# Patient Record
Sex: Female | Born: 1935 | Race: White | Hispanic: No | Smoking: Never smoker
Health system: Southern US, Community
[De-identification: ages and names within clinical notes are randomized; demographics above are authoritative.]

## PROBLEM LIST (undated history)

## (undated) DIAGNOSIS — H3530 Unspecified macular degeneration: Secondary | ICD-10-CM

## (undated) DIAGNOSIS — E0500 Thyrotoxicosis with diffuse goiter without thyrotoxic crisis or storm: Secondary | ICD-10-CM

## (undated) DIAGNOSIS — F09 Unspecified mental disorder due to known physiological condition: Secondary | ICD-10-CM

## (undated) DIAGNOSIS — M1990 Unspecified osteoarthritis, unspecified site: Secondary | ICD-10-CM

## (undated) DIAGNOSIS — M40209 Unspecified kyphosis, site unspecified: Secondary | ICD-10-CM

## (undated) DIAGNOSIS — I4891 Unspecified atrial fibrillation: Secondary | ICD-10-CM

## (undated) DIAGNOSIS — I10 Essential (primary) hypertension: Secondary | ICD-10-CM

## (undated) DIAGNOSIS — K449 Diaphragmatic hernia without obstruction or gangrene: Secondary | ICD-10-CM

## (undated) DIAGNOSIS — Z950 Presence of cardiac pacemaker: Secondary | ICD-10-CM

## (undated) DIAGNOSIS — K529 Noninfective gastroenteritis and colitis, unspecified: Secondary | ICD-10-CM

## (undated) DIAGNOSIS — N390 Urinary tract infection, site not specified: Secondary | ICD-10-CM

## (undated) DIAGNOSIS — E039 Hypothyroidism, unspecified: Secondary | ICD-10-CM

## (undated) DIAGNOSIS — Z8619 Personal history of other infectious and parasitic diseases: Secondary | ICD-10-CM

## (undated) DIAGNOSIS — K5790 Diverticulosis of intestine, part unspecified, without perforation or abscess without bleeding: Secondary | ICD-10-CM

## (undated) DIAGNOSIS — A499 Bacterial infection, unspecified: Secondary | ICD-10-CM

## (undated) DIAGNOSIS — N261 Atrophy of kidney (terminal): Secondary | ICD-10-CM

## (undated) DIAGNOSIS — Z8719 Personal history of other diseases of the digestive system: Secondary | ICD-10-CM

## (undated) HISTORY — DX: Unspecified atrial fibrillation: I48.91

## (undated) HISTORY — DX: Unspecified kyphosis, site unspecified: M40.209

## (undated) HISTORY — DX: Unspecified macular degeneration: H35.30

## (undated) HISTORY — DX: Urinary tract infection, site not specified: A49.9

## (undated) HISTORY — DX: Unspecified mental disorder due to known physiological condition: F09

## (undated) HISTORY — DX: Diverticulosis of intestine, part unspecified, without perforation or abscess without bleeding: K57.90

## (undated) HISTORY — DX: Bacterial infection, unspecified: N39.0

## (undated) HISTORY — DX: Personal history of other diseases of the digestive system: Z87.19

## (undated) HISTORY — DX: Essential (primary) hypertension: I10

## (undated) HISTORY — DX: Diaphragmatic hernia without obstruction or gangrene: K44.9

## (undated) HISTORY — DX: Hypothyroidism, unspecified: E03.9

## (undated) HISTORY — DX: Thyrotoxicosis with diffuse goiter without thyrotoxic crisis or storm: E05.00

## (undated) HISTORY — DX: Unspecified osteoarthritis, unspecified site: M19.90

## (undated) HISTORY — DX: Atrophy of kidney (terminal): N26.1

## (undated) HISTORY — DX: Noninfective gastroenteritis and colitis, unspecified: K52.9

---

## 1997-04-03 ENCOUNTER — Emergency Department (HOSPITAL_COMMUNITY): Admission: AD | Admit: 1997-04-03 | Discharge: 1997-04-03 | Payer: Self-pay | Admitting: Emergency Medicine

## 1997-11-10 ENCOUNTER — Other Ambulatory Visit: Admission: RE | Admit: 1997-11-10 | Discharge: 1997-11-10 | Payer: Self-pay | Admitting: Family Medicine

## 1998-11-24 ENCOUNTER — Other Ambulatory Visit: Admission: RE | Admit: 1998-11-24 | Discharge: 1998-11-24 | Payer: Self-pay | Admitting: Family Medicine

## 1999-05-24 ENCOUNTER — Encounter: Payer: Self-pay | Admitting: Family Medicine

## 1999-05-24 ENCOUNTER — Encounter: Admission: RE | Admit: 1999-05-24 | Discharge: 1999-05-24 | Payer: Self-pay | Admitting: Family Medicine

## 1999-05-31 ENCOUNTER — Encounter: Payer: Self-pay | Admitting: Family Medicine

## 1999-05-31 ENCOUNTER — Encounter: Admission: RE | Admit: 1999-05-31 | Discharge: 1999-05-31 | Payer: Self-pay | Admitting: Family Medicine

## 1999-07-31 ENCOUNTER — Inpatient Hospital Stay (HOSPITAL_COMMUNITY): Admission: EM | Admit: 1999-07-31 | Discharge: 1999-08-14 | Payer: Self-pay | Admitting: Emergency Medicine

## 1999-08-02 ENCOUNTER — Encounter: Payer: Self-pay | Admitting: Gastroenterology

## 1999-08-03 ENCOUNTER — Encounter: Payer: Self-pay | Admitting: Gastroenterology

## 1999-08-06 HISTORY — PX: OTHER SURGICAL HISTORY: SHX169

## 1999-08-21 ENCOUNTER — Emergency Department (HOSPITAL_COMMUNITY): Admission: EM | Admit: 1999-08-21 | Discharge: 1999-08-21 | Payer: Self-pay | Admitting: *Deleted

## 1999-08-21 ENCOUNTER — Encounter: Payer: Self-pay | Admitting: *Deleted

## 2000-04-21 ENCOUNTER — Inpatient Hospital Stay (HOSPITAL_COMMUNITY): Admission: RE | Admit: 2000-04-21 | Discharge: 2000-04-24 | Payer: Self-pay | Admitting: General Surgery

## 2000-04-21 HISTORY — PX: OTHER SURGICAL HISTORY: SHX169

## 2001-09-17 ENCOUNTER — Encounter: Payer: Self-pay | Admitting: Emergency Medicine

## 2001-09-17 ENCOUNTER — Emergency Department (HOSPITAL_COMMUNITY): Admission: EM | Admit: 2001-09-17 | Discharge: 2001-09-17 | Payer: Self-pay | Admitting: Emergency Medicine

## 2001-09-17 ENCOUNTER — Encounter: Payer: Self-pay | Admitting: Endocrinology

## 2001-09-17 ENCOUNTER — Ambulatory Visit (HOSPITAL_COMMUNITY): Admission: RE | Admit: 2001-09-17 | Discharge: 2001-09-17 | Payer: Self-pay | Admitting: Endocrinology

## 2001-09-29 ENCOUNTER — Inpatient Hospital Stay (HOSPITAL_COMMUNITY): Admission: EM | Admit: 2001-09-29 | Discharge: 2001-10-02 | Payer: Self-pay | Admitting: Emergency Medicine

## 2001-10-02 ENCOUNTER — Encounter: Payer: Self-pay | Admitting: Endocrinology

## 2001-10-02 ENCOUNTER — Ambulatory Visit (HOSPITAL_COMMUNITY): Admission: RE | Admit: 2001-10-02 | Discharge: 2001-10-02 | Payer: Self-pay | Admitting: Endocrinology

## 2001-10-03 DIAGNOSIS — Z8719 Personal history of other diseases of the digestive system: Secondary | ICD-10-CM

## 2001-10-03 HISTORY — DX: Personal history of other diseases of the digestive system: Z87.19

## 2002-01-25 ENCOUNTER — Inpatient Hospital Stay (HOSPITAL_COMMUNITY): Admission: EM | Admit: 2002-01-25 | Discharge: 2002-01-26 | Payer: Self-pay | Admitting: Emergency Medicine

## 2002-02-08 ENCOUNTER — Other Ambulatory Visit: Admission: RE | Admit: 2002-02-08 | Discharge: 2002-02-08 | Payer: Self-pay | Admitting: Endocrinology

## 2003-02-01 DIAGNOSIS — N261 Atrophy of kidney (terminal): Secondary | ICD-10-CM

## 2003-02-01 HISTORY — DX: Atrophy of kidney (terminal): N26.1

## 2003-10-13 ENCOUNTER — Inpatient Hospital Stay (HOSPITAL_COMMUNITY): Admission: RE | Admit: 2003-10-13 | Discharge: 2003-10-21 | Payer: Self-pay | Admitting: Orthopedic Surgery

## 2003-10-13 DIAGNOSIS — I4891 Unspecified atrial fibrillation: Secondary | ICD-10-CM

## 2003-10-13 HISTORY — PX: TOTAL KNEE ARTHROPLASTY: SHX125

## 2003-10-13 HISTORY — DX: Unspecified atrial fibrillation: I48.91

## 2003-10-16 ENCOUNTER — Encounter (INDEPENDENT_AMBULATORY_CARE_PROVIDER_SITE_OTHER): Payer: Self-pay | Admitting: *Deleted

## 2003-11-09 ENCOUNTER — Inpatient Hospital Stay (HOSPITAL_COMMUNITY): Admission: EM | Admit: 2003-11-09 | Discharge: 2003-12-05 | Payer: Self-pay | Admitting: Emergency Medicine

## 2003-12-17 ENCOUNTER — Ambulatory Visit: Payer: Self-pay | Admitting: Gastroenterology

## 2003-12-18 ENCOUNTER — Ambulatory Visit (HOSPITAL_COMMUNITY): Admission: RE | Admit: 2003-12-18 | Discharge: 2003-12-18 | Payer: Self-pay | Admitting: Internal Medicine

## 2003-12-18 ENCOUNTER — Ambulatory Visit: Payer: Self-pay | Admitting: Internal Medicine

## 2004-02-03 ENCOUNTER — Ambulatory Visit: Payer: Self-pay | Admitting: Internal Medicine

## 2004-11-17 IMAGING — CR DG CHEST 1V PORT
1 series · 1 of 1 positions shown · non-contrast
Comparison: none

CLINICAL DATA: Atrial fibrillation. 
 PORTABLE CHEST, 11/24/03, [DATE] HOURS
 Right upper extremity PICC line remains.  Heart is mildly enlarged and stable.  Interstitial markings remain moderately prominent bilaterally, suggesting pulmonary edema.  There may be a small left pleural effusion not clearly present on yesterday's study.

[view not recorded]
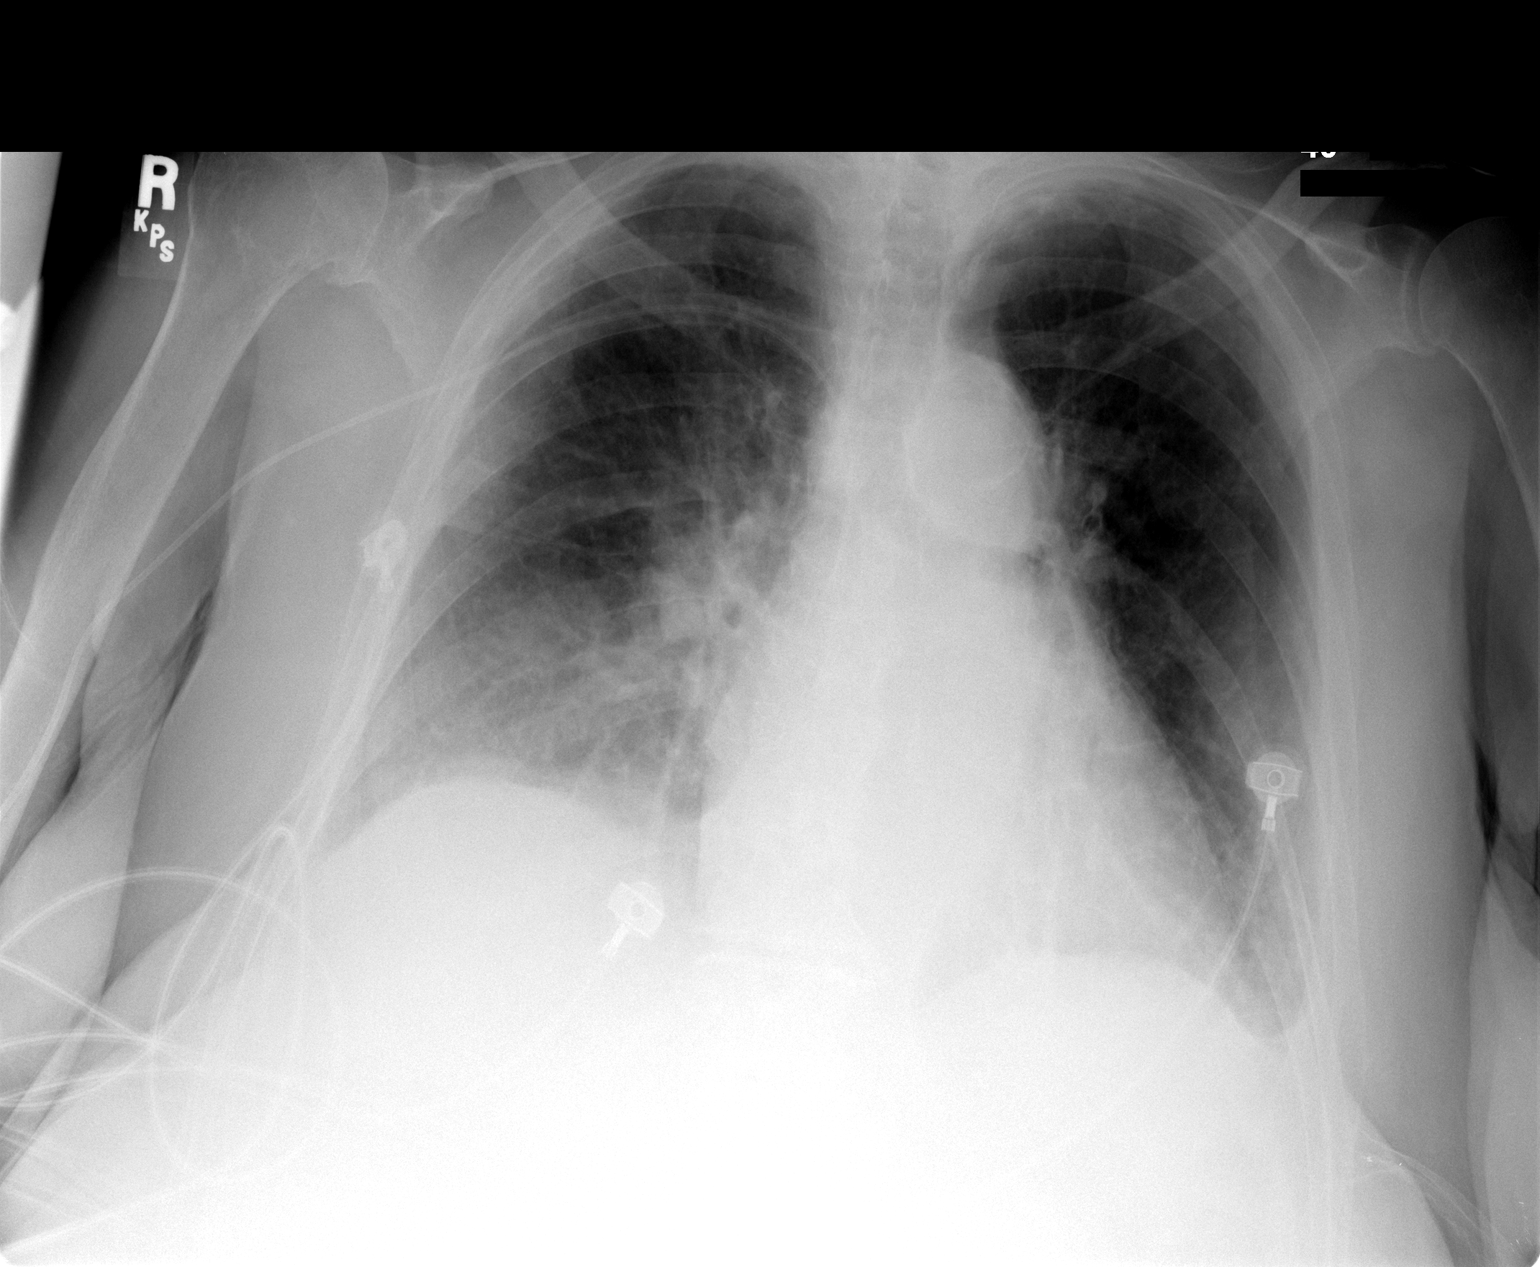

[1 of 1 positions shown; findings below may reference images not displayed]

IMPRESSION: No significant change in probable old congestive heart failure.  Query small left pleural effusion.

## 2004-11-19 IMAGING — CT CT ABDOMEN W/ CM
1 of 3 series · 14 of 32 positions shown, 19 images · IV contrast (100 ML OMNI 300)
Comparison: none

CLINICAL DATA: Generalized abdominal pain and distention. Elevated white count. Atrial
fibrillation.   Pseudomembranous colitis.
TECHNIQUE: Multidetector CT imaging of the abdomen and pelvis was performed during administration
of 100 cc Omnipaque 300 intravenous contrast. Oral contrast was also administered.

[Series 2: routine abdomen · axial · 0.74mm/px · z∈[-441,-31]mm · 14 of 92 slices shown, 19 images]
[im 5/92  soft-tissue]
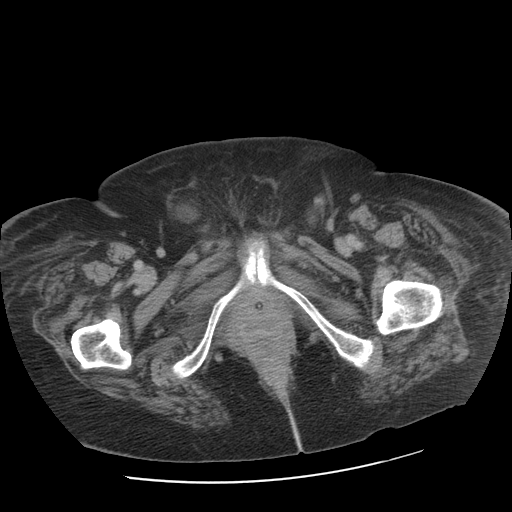
[im 5/92  bone]
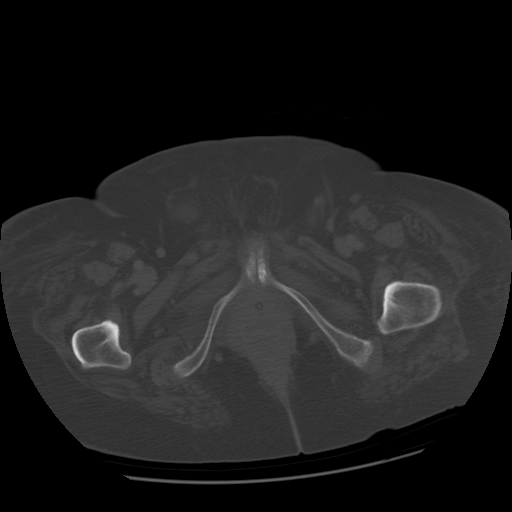
[im 14/92  soft-tissue]
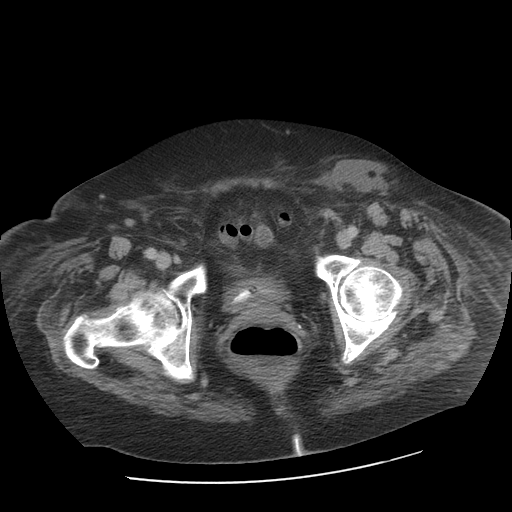
[im 19/92  soft-tissue]
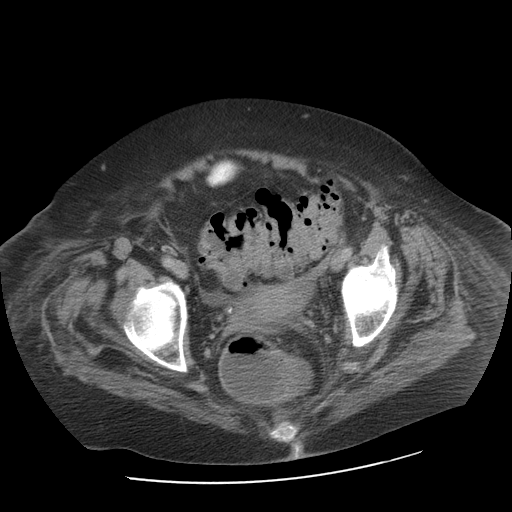
[im 28/92  soft-tissue]
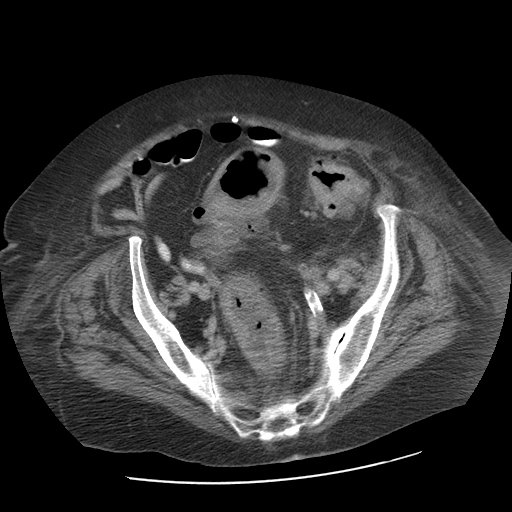
[im 32/92  soft-tissue]
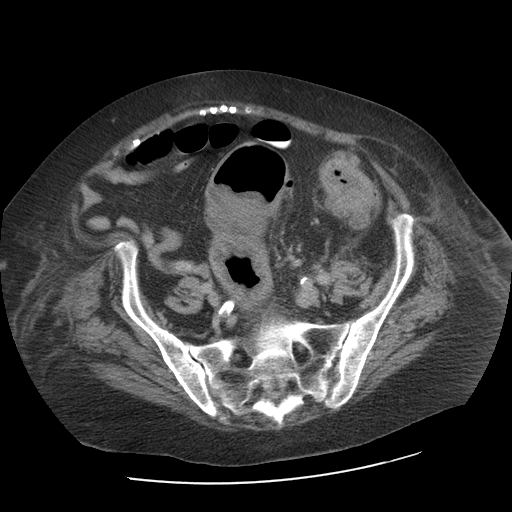
[im 41/92  soft-tissue]
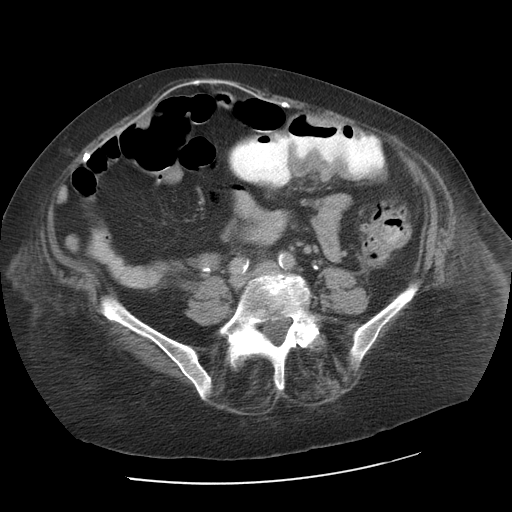
[im 46/92  soft-tissue]
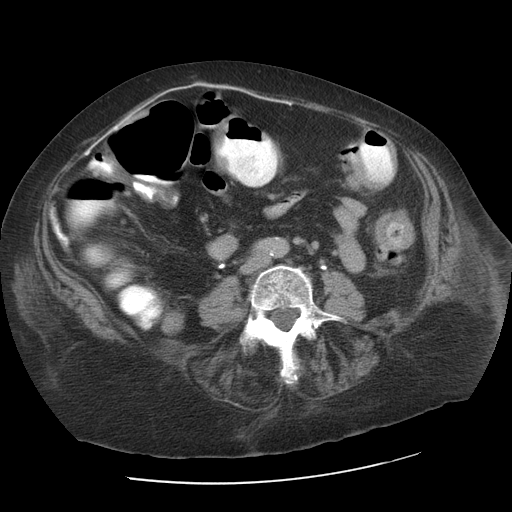
[im 51/92  soft-tissue]
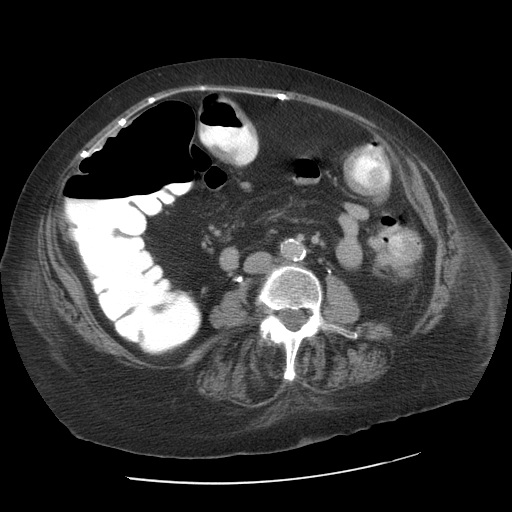
[im 60/92  soft-tissue]
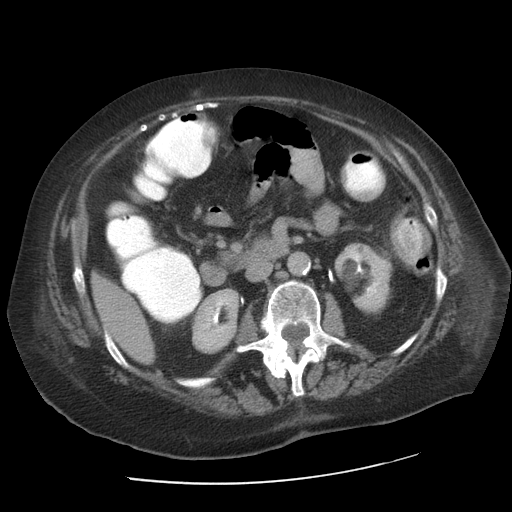
[im 60/92  bone]
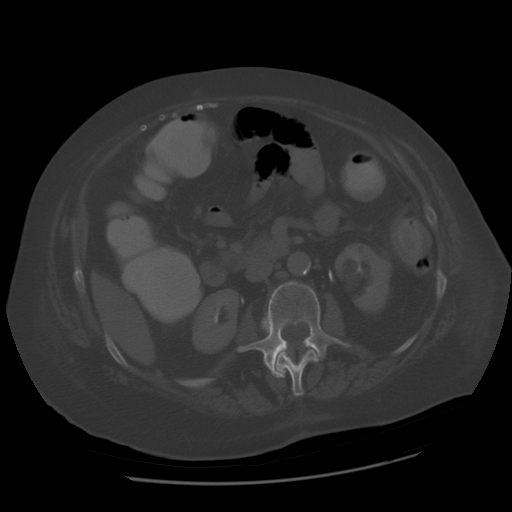
[im 64/92  soft-tissue]
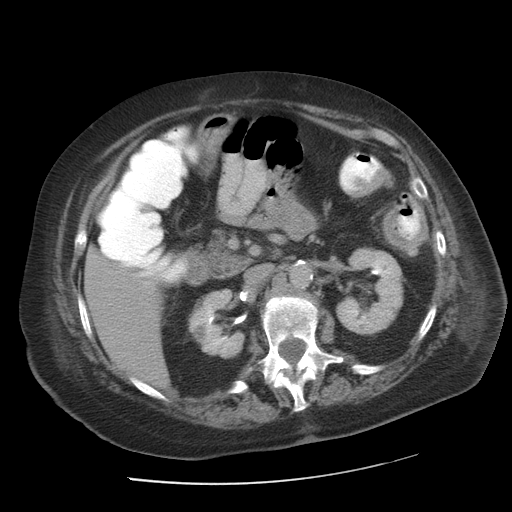
[im 73/92  soft-tissue]
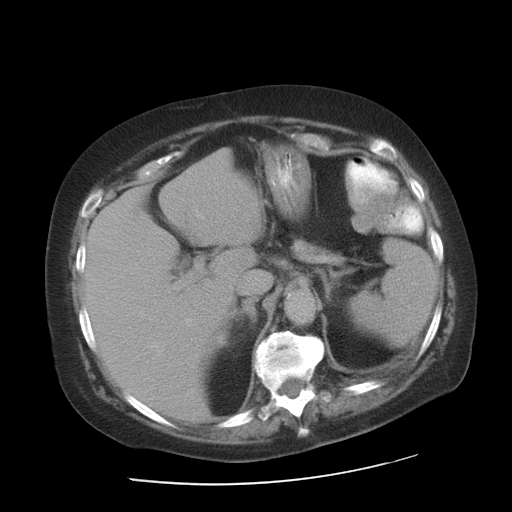
[im 73/92  lung]
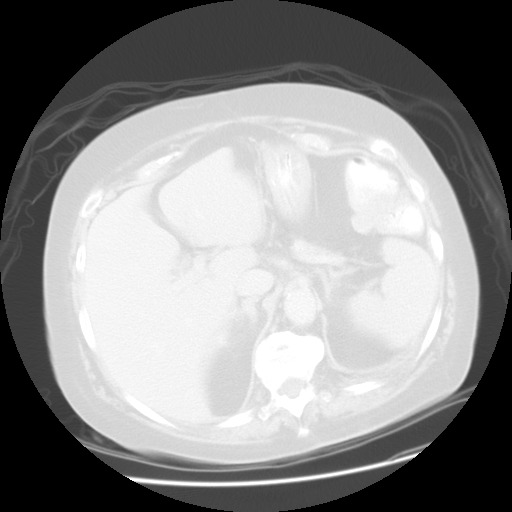
[im 78/92  soft-tissue]
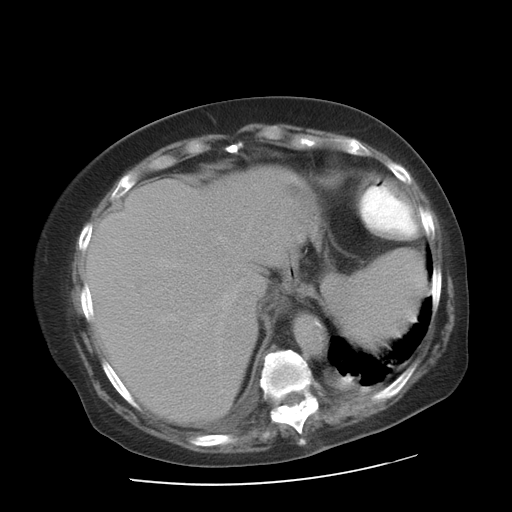
[im 78/92  lung]
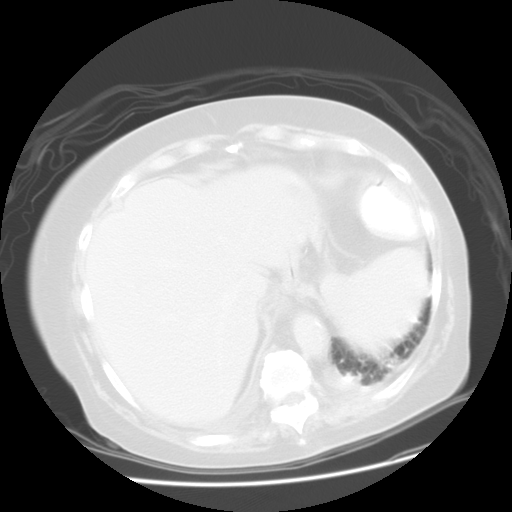
[im 82/92  lung]
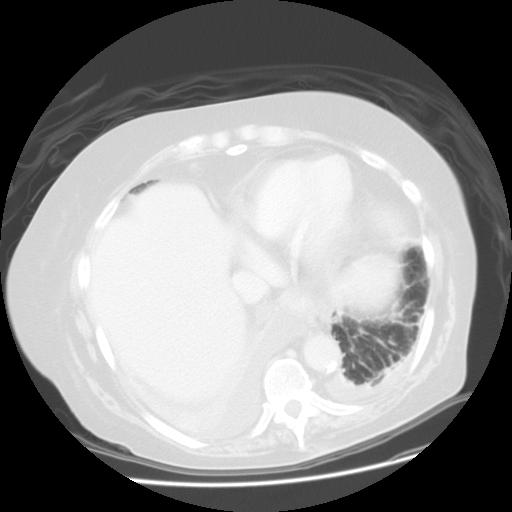
[im 87/92  soft-tissue]
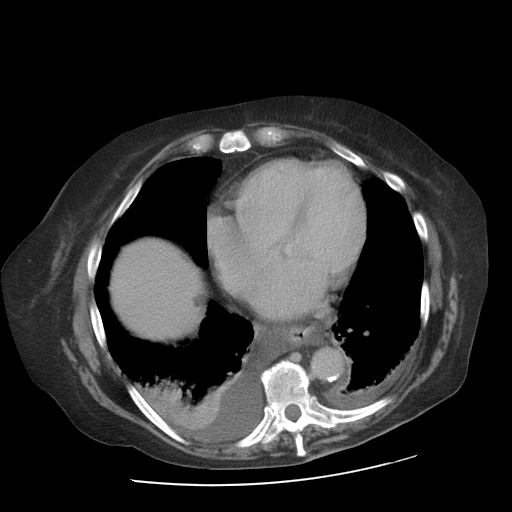
[im 87/92  lung]
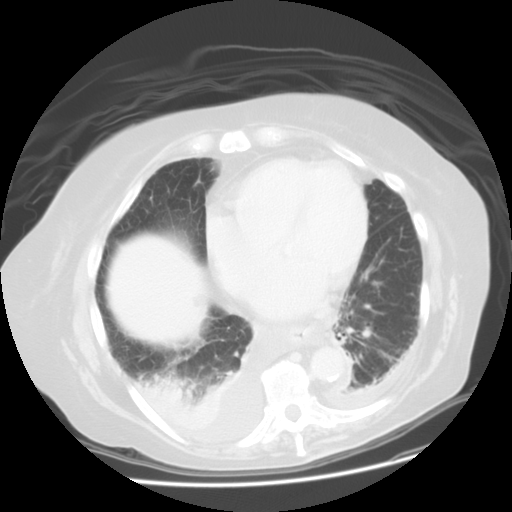

[14 of 32 positions shown; findings below may reference images not displayed]

Comparison was made with the prior study of 11/13/03.

ABDOMEN CT WITH CONTRAST:

Small right pleural effusion is mildly increased in size, while tiny left pleural effusion is
stable. Small cysts in the dome of the liver are unchanged. Small bilateral renal cysts are also
stable. 

Significant decrease in colonic wall thickening is seen involving the ascending and transverse
colon. There is persistent wall thickening involving the splenic flexure and descending colon which
has not significantly changed.

There is no evidence of abscess, ascites, or free air. There is no evidence of adenopathy.  Tiny
density is seen in the gallbladder neck suspicious for a tiny gallstone. There is no evidence of
acute cholecystitis.
IMPRESSION: 1.  Improving colitis involving the ascending and transverse colon. Colitis in the areas of the
splenic flexure and descending colon have not significantly changed.

2.  Increased small right pleural effusion. Tiny left pleural effusion is stable.

3.  Question tiny gallstones. 

PELVIS CT WITH CONTRAST:

Moderate to severe colonic wall thickening is again seen involving the rectosigmoid colon and
descending colon and is not significantly changed. Diverticulosis is also noted. There is no
evidence of abscess or free fluid. No masses are identified. Bilateral inguinal hernias containing
fat are again seen, left side larger than right. A Foley catheter is seen within the bladder.
IMPRESSION: 1.   No significant change in severe colitis involving the descending and rectosigmoid colon.

2.  No evidence of abscess or free fluid. 

3.  Stable bilateral inguinal hernias containing only fat. No evidence of herniated bowel loops.

## 2005-09-20 ENCOUNTER — Ambulatory Visit: Payer: Self-pay

## 2005-09-21 ENCOUNTER — Ambulatory Visit: Payer: Self-pay | Admitting: Cardiology

## 2005-10-04 ENCOUNTER — Encounter: Payer: Self-pay | Admitting: Cardiovascular Disease

## 2005-10-04 ENCOUNTER — Ambulatory Visit: Payer: Self-pay

## 2005-10-20 ENCOUNTER — Ambulatory Visit: Payer: Self-pay | Admitting: Cardiology

## 2005-12-15 ENCOUNTER — Ambulatory Visit: Payer: Self-pay

## 2005-12-19 ENCOUNTER — Ambulatory Visit: Payer: Self-pay | Admitting: Cardiology

## 2006-05-29 ENCOUNTER — Ambulatory Visit: Payer: Self-pay | Admitting: Cardiology

## 2006-10-29 ENCOUNTER — Emergency Department (HOSPITAL_COMMUNITY): Admission: EM | Admit: 2006-10-29 | Discharge: 2006-10-29 | Payer: Self-pay | Admitting: Emergency Medicine

## 2007-06-27 ENCOUNTER — Ambulatory Visit: Payer: Self-pay | Admitting: Cardiology

## 2008-07-08 ENCOUNTER — Encounter: Admission: RE | Admit: 2008-07-08 | Discharge: 2008-07-08 | Payer: Self-pay | Admitting: Internal Medicine

## 2008-10-30 ENCOUNTER — Encounter: Payer: Self-pay | Admitting: Internal Medicine

## 2009-06-11 ENCOUNTER — Ambulatory Visit: Payer: Self-pay

## 2009-06-11 ENCOUNTER — Encounter: Payer: Self-pay | Admitting: Internal Medicine

## 2009-06-12 ENCOUNTER — Telehealth: Payer: Self-pay | Admitting: Cardiology

## 2009-06-18 ENCOUNTER — Telehealth (INDEPENDENT_AMBULATORY_CARE_PROVIDER_SITE_OTHER): Payer: Self-pay | Admitting: *Deleted

## 2009-06-23 ENCOUNTER — Encounter: Payer: Self-pay | Admitting: Internal Medicine

## 2009-06-30 ENCOUNTER — Ambulatory Visit: Payer: Self-pay | Admitting: Internal Medicine

## 2009-06-30 DIAGNOSIS — I10 Essential (primary) hypertension: Secondary | ICD-10-CM

## 2009-06-30 DIAGNOSIS — I4891 Unspecified atrial fibrillation: Secondary | ICD-10-CM

## 2009-06-30 DIAGNOSIS — I498 Other specified cardiac arrhythmias: Secondary | ICD-10-CM | POA: Insufficient documentation

## 2009-06-30 DIAGNOSIS — R55 Syncope and collapse: Secondary | ICD-10-CM | POA: Insufficient documentation

## 2009-07-07 ENCOUNTER — Telehealth: Payer: Self-pay | Admitting: Internal Medicine

## 2009-07-08 ENCOUNTER — Encounter: Payer: Self-pay | Admitting: Internal Medicine

## 2009-07-13 ENCOUNTER — Encounter: Payer: Self-pay | Admitting: Internal Medicine

## 2009-07-16 ENCOUNTER — Encounter: Payer: Self-pay | Admitting: Internal Medicine

## 2009-07-17 ENCOUNTER — Ambulatory Visit: Payer: Self-pay | Admitting: Internal Medicine

## 2009-07-17 ENCOUNTER — Ambulatory Visit (HOSPITAL_COMMUNITY): Admission: RE | Admit: 2009-07-17 | Discharge: 2009-07-18 | Payer: Self-pay | Admitting: Internal Medicine

## 2009-07-18 ENCOUNTER — Encounter: Payer: Self-pay | Admitting: Internal Medicine

## 2009-07-20 ENCOUNTER — Ambulatory Visit: Payer: Self-pay | Admitting: Internal Medicine

## 2009-07-20 ENCOUNTER — Telehealth: Payer: Self-pay | Admitting: Internal Medicine

## 2009-07-27 ENCOUNTER — Ambulatory Visit: Payer: Self-pay | Admitting: Internal Medicine

## 2009-07-27 DIAGNOSIS — R609 Edema, unspecified: Secondary | ICD-10-CM | POA: Insufficient documentation

## 2009-11-02 ENCOUNTER — Ambulatory Visit: Payer: Self-pay | Admitting: Internal Medicine

## 2010-02-04 ENCOUNTER — Encounter: Payer: Self-pay | Admitting: Internal Medicine

## 2010-02-04 ENCOUNTER — Ambulatory Visit
Admission: RE | Admit: 2010-02-04 | Discharge: 2010-02-04 | Payer: Self-pay | Source: Home / Self Care | Attending: Internal Medicine | Admitting: Internal Medicine

## 2010-02-25 ENCOUNTER — Encounter: Payer: Self-pay | Admitting: Internal Medicine

## 2010-03-04 NOTE — Progress Notes (Signed)
  Faxed Holter over to Anita/ [facility] [phone] [facility]  Jun 18, 2009 11:54 AM

## 2010-03-04 NOTE — Assessment & Plan Note (Signed)
Summary: 10-14 day s/p pacer/mt   CC:  10-14 day s/p placer.  Pt having device checked today.  Swelling on ankle of left leg..  Current Medications (verified): 1)  Levoxyl 150 Mcg Tabs (Levothyroxine Sodium) .... Take One Tablet Once Daily 2)  Digoxin 0.125 Mg Tabs (Digoxin) .... Take One Tablet Once Daily 3)  Metoprolol Tartrate 50 Mg Tabs (Metoprolol Tartrate) .... One Tablet Two Times A Day 4)  Preservision/lutein  Caps (Multiple Vitamins-Minerals) .... Once Daily 5)  Centrum Silver  Tabs (Multiple Vitamins-Minerals) .... Once Daily 6)  Warfarin Sodium 2 Mg Tabs (Warfarin Sodium) .... Uad 7)  Enablex 7.5 Mg Xr24h-Tab (Darifenacin Hydrobromide) .... Take One Tablet Once Daily 8)  Aricept 10 Mg Tabs (Donepezil Hcl) .... Take One Tablet Once Daily 9)  Imodium A-D 2 Mg Tabs (Loperamide Hcl) .... As Needed  Allergies (verified): 1)  ! Jonne Ply  Past History:  Past Medical History: Last updated: 06/24/2009 Hypertension Hypothyroidism History of toxic goiter with subsequent ablation and hypothyroid state Hyperlipidemia Osteoarthritis Osteoporosis Kyphosis of thoracic spine Macular degeneration Chronic diarrhea Diverticulosis Diverticular bleed October 03, 2001 Asthma Mild cognitive deficit Bronchiectasis left lower lobe noted on x-ray 2/005. History of hiatal hernia with repair. Urinary incontinence Recurrent urinary tract infections October 13, 2003, new onset atrial fibrillation 2005, right renal atrophy  Vital Signs:  Patient profile:   77 year old female Height:      65 inches Weight:      147 pounds BMI:     24.55 Pulse rhythm:   regular BP sitting:   180 / 92  (left arm) Cuff size:   regular  Vitals Entered By: Judithe Modest CMA (July 27, 2009 2:22 PM)  Physical Exam  General:  The patient was alert and oriented in no acute distress. Lungs were clear. Heart sounds were irregular without murmurs or gallops. pacemaker pocket was well-healedAbdomen was soft  with active bowel sounds. There is no clubbing cyanosis ; healing wound on the left shin with bilateral edema left greater than right    PPM Specifications Following MD:  Sherryl Manges, MD     PPM Vendor:  St Jude     PPM Model Number:  [id number]     PPM Serial Number:  [id number] PPM DOI:  07/17/2009     PPM Implanting MD:  Sherryl Manges, MD  Lead 1    Location: RV     DOI: 07/17/2009     Model #: 1948     Serial #: [id number]      Magnet Response Rate:  BOL 100 ERI  85  Indications:  A-fib   PPM Follow Up Remote Check?  No Battery Voltage:  3.05 V     Battery Est. Longevity:  10 YEARS     Pacer Dependent:  No     Right Ventricle  Amplitude: 9.6 mV, Impedance: 560 ohms, Threshold: 0.75 V at 0.4 msec  Episodes Ventricular High Rate:  0     Ventricular Pacing:  39%  Parameters Mode:  VVI     Lower Rate Limit:  60     Next Cardiology Appt Due:  10/01/2009 Tech Comments:  Wound check appt.  Steri-strips removed.  Wound without redness or edema.  Normal device function.  No changes made today.  ROV 3 months SK. Gypsy Balsam RN BSN  July 27, 2009 2:42 PM   Impression & Recommendations:  Problem # 1:  EDEMA (ICD-782.3)  I will begin her a low dose (Lasix 20) diuretic  once or twice a week to help mobilize some of the fluid.  last creatinine and potassium early June were 1.3/4.0  Problem # 2:  BRADYCARDIA (ICD-427.89) She is pacing 40% of the time; we'll leave things as they are Her updated medication list for this problem includes:    Metoprolol Tartrate 50 Mg Tabs (Metoprolol tartrate) ..... One tablet two times a day    Warfarin Sodium 2 Mg Tabs (Warfarin sodium) ..... Uad  Problem # 3:  HYPERTENSION, BENIGN (ICD-401.1) repeat blood pressure was 156/72; diuretics may help this problem. Her updated medication list for this problem includes:    Metoprolol Tartrate 50 Mg Tabs (Metoprolol tartrate) ..... One tablet two times a day    Furosemide 20 Mg Tabs (Furosemide) .Marland Kitchen... Take one tablet by  mouth once or twice weekly  Patient Instructions: 1)  Your physician recommends that you schedule a follow-up appointment in: 3 months with Dr Graciela Husbands. 2)  Your physician has recommended you make the following change in your medication: Take Furosemide 20mg  1 tablet once or twice weekly. Prescriptions: FUROSEMIDE 20 MG TABS (FUROSEMIDE) Take one tablet by mouth once or twice weekly  #8 x 3   Entered by:   Optometrist BSN   Authorized by:   Nathen May, MD, [facility]   Signed by:   Gypsy Balsam RN BSN on 07/27/2009   Method used:   Electronically to        [facility]* (retail)       [address]       [place], [place]  [id number]       Ph: [phone]       Fax: [phone]   RxID:   [id number]

## 2010-03-04 NOTE — Miscellaneous (Signed)
Summary: Orders Update  Clinical Lists Changes  Orders: Added new Test order of TLB-BMP (Basic Metabolic Panel-BMET) (80048-METABOL) - Signed Added new Test order of TLB-CBC Platelet - w/Differential (85025-CBCD) - Signed Added new Test order of TLB-PT (Protime) (85610-PTP) - Signed Added new Test order of TLB-PTT (85730-PTTL) - Signed Observations: Added new observation of PI CARDIO: Labs to be drawn at [facility] on 07-16-09.  Order faxed to Sue Lush, RN. (07/13/2009 12:07)      Patient Instructions: 1)  Labs to be drawn at [facility] on 07-16-09.  Order faxed to Sue Lush, RN.

## 2010-03-04 NOTE — Progress Notes (Signed)
Summary: next plan of care while pt wear holter  Phone Note From Other Clinic   Caller: Melvenia Beam [phone]- [phone].  Request: Talk with Nurse Details for Reason:  has question regarding holter, next step of plan of care.  Initial call taken by: Lorne Skeens,  Jun 12, 2009 10:23 AM  Follow-up for Phone Call        pt asked Sue Lush to remove the monitor that she was wearing--I do not have any information about the monitor and have transferred Sue Lush to Marcos Eke for further instrucitons about the monitor Katina Dung, RN, BSN  Jun 12, 2009 10:45 AM

## 2010-03-04 NOTE — Letter (Signed)
Summary: Implantable Device Instructions  Architectural technologist, Main Office  [address]   [place], [place] [address]   Phone: [phone]  Fax: [phone]      Implantable Device Instructions  You are scheduled for:  __X___ Permanent Transvenous Pacemaker _____ Implantable Cardioverter Defibrillator _____ Implantable Loop Recorder _____ Generator Change  on FRIDAY, July 17, 2009 with Dr. Graciela Husbands.  1.  Please arrive at the Short Stay Center at [facility] at 8 AM on the day of your procedure.  2.  Do not eat or drink after midnight the night before your procedure.   3.  Ok to take meds morning of procedure with water.  I will call if you need to hold Coumadin.  4.  Plan for an overnight stay.  Bring your insurance cards and a list of your medications.  5.  Wash your chest and neck with antibacterial soap (any brand) the evening before and the morning of your procedure.  Rinse well.   *If you have ANY questions after you get home, please call the office [phone]. [name]  *Every attempt is made to prevent procedures from being rescheduled.  Due to the nauture of Electrophysiology, rescheduling can happen.  The physician is always aware and directs the staff when this occurs.

## 2010-03-04 NOTE — Cardiovascular Report (Signed)
Summary: Pre-Op Orders  Pre-Op Orders   Imported By: Debby Freiberg 07/24/2009 15:44:06  _____________________________________________________________________  External Attachment:    Type:   Image     Comment:   External Document

## 2010-03-04 NOTE — Miscellaneous (Addendum)
  increase Metoprolol to 75mg  for rate control.   Clinical Lists Changes  Medications: Changed medication from METOPROLOL TARTRATE 50 MG TABS (METOPROLOL TARTRATE) one tablet two times a day to METOPROLOL TARTRATE 50 MG TABS (METOPROLOL TARTRATE) one and 1/2  tablet two times a day

## 2010-03-04 NOTE — Cardiovascular Report (Signed)
Summary: Office Visit   Office Visit   Imported By: Roderic Ovens 11/03/2009 09:00:19  _____________________________________________________________________  External Attachment:    Type:   Image     Comment:   External Document

## 2010-03-04 NOTE — Assessment & Plan Note (Signed)
Summary: f72m/jml   Visit Type:  Follow-up  CC:  no complaints.  History of Present Illness: [name] is seen in followup for atrial fibrillation and bradycardia. She is status post pacemaker implantation.   Since her implant she has had no more lightheadedness or syncope. The device pocket is well healed. The patient denies SOB, chest pain, edema or palpitations    Problems Prior to Update: 1)  Pacemaker,st. Jude-vvi  (ICD-V45.01) 2)  Edema  (ICD-782.3) 3)  Hypertension, Benign  (ICD-401.1) 4)  Bradycardia  (ICD-427.89) 5)  Syncope  (ICD-780.2) 6)  Atrial Fibrillation  (ICD-427.31)  Current Medications (verified): 1)  Levoxyl 150 Mcg Tabs (Levothyroxine Sodium) .... Take One Tablet Once Daily 2)  Digoxin 0.125 Mg Tabs (Digoxin) .... Take One Tablet Once Daily 3)  Metoprolol Tartrate 50 Mg Tabs (Metoprolol Tartrate) .... One Tablet Two Times A Day 4)  Preservision/lutein  Caps (Multiple Vitamins-Minerals) .... Two Times A Day 5)  Centrum Silver  Tabs (Multiple Vitamins-Minerals) .... Once Daily 6)  Warfarin Sodium 2 Mg Tabs (Warfarin Sodium) .... Uad 7)  Enablex 7.5 Mg Xr24h-Tab (Darifenacin Hydrobromide) .... Take One Tablet Once Daily 8)  Aricept 10 Mg Tabs (Donepezil Hcl) .... Take One Tablet Once Daily 9)  Imodium A-D 2 Mg Tabs (Loperamide Hcl) .... As Needed  Allergies (verified): 1)  ! Jonne Ply  Past History:  Past Medical History: Last updated: 06/24/2009 Hypertension Hypothyroidism History of toxic goiter with subsequent ablation and hypothyroid state Hyperlipidemia Osteoarthritis Osteoporosis Kyphosis of thoracic spine Macular degeneration Chronic diarrhea Diverticulosis Diverticular bleed October 03, 2001 Asthma Mild cognitive deficit Bronchiectasis left lower lobe noted on x-ray 2/005. History of hiatal hernia with repair. Urinary incontinence Recurrent urinary tract infections October 13, 2003, new onset atrial fibrillation 2005, right renal  atrophy  Vital Signs:  Patient profile:   77 year old female Height:      65 inches Weight:      141 pounds Pulse rate:   79 / minute BP sitting:   106 / 72  (right arm) Cuff size:   large  Vitals Entered By: Caralee Ates CMA (November 02, 2009 3:25 PM)  Physical Exam  General:  The patient was alert and oriented in no acute distress. HEENT Normal.  Neck veins were flat, carotids were brisk.  Lungs were clear.  Heart sounds were regular without murmurs or gallops.  Abdomen was soft with active bowel sounds. There is no clubbing cyanosis or edema. Skin Warm and dry device pocke well healed   PPM Specifications Following MD:  Sherryl Manges, MD     PPM Vendor:  St Jude     PPM Model Number:  [id number]     PPM Serial Number:  [id number] PPM DOI:  07/17/2009     PPM Implanting MD:  Sherryl Manges, MD  Lead 1    Location: RV     DOI: 07/17/2009     Model #: 1946     Serial #: [id number]      Magnet Response Rate:  BOL 100 ERI  85  Indications:  A-fib   PPM Follow Up Battery Voltage:  3.01 V     Battery Est. Longevity:  9.4-10.1 YRS     Pacer Dependent:  No     Right Ventricle  Amplitude: 12.0 mV, Impedance: 690 ohms, Threshold: 0.75 V at 0.4 msec  Episodes MS Episodes:  0     Ventricular High Rate:  0     Ventricular Pacing:  40%  Parameters Mode:  VVI     Lower Rate Limit:  60     Tech Comments:  1 VHR EPISODE.  NORMAL DEVICE FUNCTION.  CHANGED RV OUTPUT FROM 3.5 TO 2.5 V.  MERLIN CHECK 02-04-09. ROV IN JUNE 2012 W/SK. Vella Kohler  November 02, 2009 3:35 PM  Impression & Recommendations:  Problem # 1:  ATRIAL FIBRILLATION (ICD-427.31) permanent with reasonable rate control about 10% of her beats are faster than 100 beats per minute. However, given the fact that she is doing well I am reluctant to up titrate her medications especially given the fact that her blood pressure is 106. Her updated medication list for this problem includes:    Digoxin 0.125 Mg Tabs (Digoxin) .Marland Kitchen... Take one  tablet once daily    Metoprolol Tartrate 50 Mg Tabs (Metoprolol tartrate) ..... One tablet two times a day    Warfarin Sodium 2 Mg Tabs (Warfarin sodium) ..... Uad  Problem # 2:  SYNCOPE (ICD-780.2) no recurrent syncope Her updated medication list for this problem includes:    Metoprolol Tartrate 50 Mg Tabs (Metoprolol tartrate) ..... One tablet two times a day    Warfarin Sodium 2 Mg Tabs (Warfarin sodium) ..... Uad  Problem # 3:  PACEMAKER,ST. JUDE-VVI (ICD-V45.01) Device parameters and data were reviewed and the device was reprogrammed to maximize longevity

## 2010-03-04 NOTE — Cardiovascular Report (Signed)
Summary: Office Visit   Office Visit   Imported By: Roderic Ovens 08/05/2009 15:24:44  _____________________________________________________________________  External Attachment:    Type:   Image     Comment:   External Document

## 2010-03-04 NOTE — Miscellaneous (Signed)
Summary: Device preload  Clinical Lists Changes  Observations: Added new observation of PPM INDICATN: A-fib (07/20/2009 13:36) Added new observation of MAGNET RTE: BOL 100 ERI  85 (07/20/2009 13:36) Added new observation of PPMLEADSER1: [id number] (07/20/2009 13:36) Added new observation of PPMLEADMOD1: 1946  (07/20/2009 13:36) Added new observation of PPMLEADLOC1: RV  (07/20/2009 13:36) Added new observation of PPM IMP MD: Sherryl Manges, MD  (07/20/2009 13:36) Added new observation of PPMLEADDOI1: 07/17/2009  (07/20/2009 13:36) Added new observation of PPM DOI: 07/17/2009  (07/20/2009 13:36) Added new observation of PPM SERL#: [id number]  (07/20/2009 13:36) Added new observation of PPM MODL#: [id number]  (07/20/2009 46:96) Added new observation of PACEMAKERMFG: St Jude  (07/20/2009 13:36) Added new observation of PACEMAKER MD: Sherryl Manges, MD  (07/20/2009 13:36)      PPM Specifications Following MD:  Sherryl Manges, MD     PPM Vendor:  St Jude     PPM Model Number:  [id number]     PPM Serial Number:  [id number] PPM DOI:  07/17/2009     PPM Implanting MD:  Sherryl Manges, MD  Lead 1    Location: RV     DOI: 07/17/2009     Model #: 1946     Serial #: [id number]      Magnet Response Rate:  BOL 100 ERI  85  Indications:  A-fib

## 2010-03-04 NOTE — Assessment & Plan Note (Signed)
Summary: bradycardia/syncope/lg   CC:  new patient with bradycardia and syncope.  Pt reports only one episode of syncope and it is also reported that when taken [facility] of [facility] at the time of fainting her vitals were fine.  Pt is having difficult with getting around but per pt this is not out of the ordinary..  History of Present Illness: Mrs. [name] is seen at the request of Dr. Frederik Pear because of atrial fibrillation and abnormal Holter monitor.  She is a 77 year old woman who was diagnosed with atrial fibrillation about 5 years ago and has been treated with Coumadin. He was noted to have rapid rates and underwent gradual up titration of her beta blocker. Present were not clear she had a Holter monitor done about a month ago which was quite abnormal with nocturnal heart rates into the 30s daytime heart rates in the 120s to 140s and one episode during the day at 11:00 of a heart rate into the low 30s. She has no symptoms related to atrial fibrillation of which she is aware. Specifically she denies palpitations exercise intolerance or lightheadedness.  She did however have an episode of syncope that occurred about 4 weeks ago She was sitting playing cards with her daughter's father-in-law where she lost control the cards. She dropped him and lost consciousness. Nursing was called and by the time vital signs were obtained they were apparently normal she awakened in response about 5 minutes later  Apparently the patient also had syncope about 5 years ago the details of that are scant.  Current Medications (verified): 1)  Levoxyl 150 Mcg Tabs (Levothyroxine Sodium) .... Take One Tablet Once Daily 2)  Digoxin 0.125 Mg Tabs (Digoxin) .... Take One Tablet Once Daily 3)  Metoprolol Tartrate 50 Mg Tabs (Metoprolol Tartrate) .... One Tablet Two Times A Day 4)  Preservision/lutein  Caps (Multiple Vitamins-Minerals) .... Once Daily 5)  Centrum Silver  Tabs (Multiple Vitamins-Minerals)  .... Once Daily 6)  Warfarin Sodium 2 Mg Tabs (Warfarin Sodium) .... Uad 7)  Enablex 7.5 Mg Xr24h-Tab (Darifenacin Hydrobromide) .... Take One Tablet Once Daily 8)  Aricept 10 Mg Tabs (Donepezil Hcl) .... Take One Tablet Once Daily 9)  Imodium A-D 2 Mg Tabs (Loperamide Hcl) .... As Needed  Allergies (verified): 1)  ! Jonne Ply  Past History:  Past Medical History: Last updated: 06/24/2009 Hypertension Hypothyroidism History of toxic goiter with subsequent ablation and hypothyroid state Hyperlipidemia Osteoarthritis Osteoporosis Kyphosis of thoracic spine Macular degeneration Chronic diarrhea Diverticulosis Diverticular bleed October 03, 2001 Asthma Mild cognitive deficit Bronchiectasis left lower lobe noted on x-ray 2/005. History of hiatal hernia with repair. Urinary incontinence Recurrent urinary tract infections October 13, 2003, new onset atrial fibrillation 2005, right renal atrophy  Vital Signs:  Patient profile:   77 year old female Height:      65 inches Weight:      148 pounds BMI:     24.72 Pulse rate:   73 / minute Pulse rhythm:   regular BP sitting:   163 / 99  (left arm) Cuff size:   regular  Vitals Entered By: Judithe Modest CMA (Jun 30, 2009 3:00 PM)  Physical Exam  General:  Well developed, well nourished, in no acute distress. Head:  normal HEENT Neck:  supple without thyromegaly Chest Wall:  kyphosis without scoliosis Lungs:  clear Heart:  irregular and rapid with a 2/6 murmur Abdomen:  soft nontender with active bowel sounds Msk:  muscle strength and tone quite normal for  age Pulses:  intact distal pulses Extremities:  no clubbing or cyanosis there is 1-2+ edema on the left and trace edema on the right there is a healing biopsy lesion on the left Neurologic:  alert and oriented though with poor recall of recent events grossly normal motor function Skin:  warm and dry Cervical Nodes:  no adenopathy Psych:  engaged  affect   EKG  Procedure date:  06/30/2009  Findings:      aqtrial  fibrillation rate of 89 Interval-/0.08/0.40 Axis XLVI  Impression & Recommendations:  Problem # 1:  ATRIAL FIBRILLATION (ICD-427.31) the patient has atrial fibrillation with a relatively well-controlled ventricular response she is appropriately on Coumadin. Heart rate excursion is quite broad however. Holter monitoring demonstrated rates between 30 and 140 including during the day. It is the latter episodes that are most concerning especially in the context of her prior syncope.  Her syncopal history however suggests a vasomotor phenomenon given the prolonged duration of the event. Not withstanding though this may seem inclined to proceed with pacing given the accompanying bradycardia seen on the Holter monitor.  I reviewed with the family both my inclination as well as my misgivings related to the lack of certainty;  we discussed the benefits as well as the limitations of pacing including the possibility of recurrent syncope in the event there were a vasomotor phenomenon.  I told him that I would be in touch with Dr. Chilton Si and asked them to be thinking about there intentions at the same time  Problem # 2:  BRADYCARDIA (ICD-427.89) as above Her updated medication list for this problem includes:    Metoprolol Tartrate 50 Mg Tabs (Metoprolol tartrate) ..... One tablet two times a day    Warfarin Sodium 2 Mg Tabs (Warfarin sodium) ..... Uad  Problem # 3:  SYNCOPE (ICD-780.2) as above; we also discussed the role of implantable loop according to make sure that with the next episode we understand whether bradycardia is a significant component. Her updated medication list for this problem includes:    Metoprolol Tartrate 50 Mg Tabs (Metoprolol tartrate) ..... One tablet two times a day    Warfarin Sodium 2 Mg Tabs (Warfarin sodium) ..... Uad  Problem # 4:  HYPERTENSION, BENIGN (ICD-401.1) recently controlled and  appropriately directing [place] towards Coumadin Her updated medication list for this problem includes:    Metoprolol Tartrate 50 Mg Tabs (Metoprolol tartrate) ..... One tablet two times a day

## 2010-03-04 NOTE — Procedures (Signed)
Summary: summary report  summary report   Imported By: Mirna Mires 06/17/2009 09:52:37  _____________________________________________________________________  External Attachment:    Type:   Image     Comment:   External Document

## 2010-03-04 NOTE — Progress Notes (Signed)
Summary: PT DAUGHTER CALLING BCK HAVEN'T HEARD FROM [place]  Phone Note Call from Patient   Caller: Daughter [phone] / [phone] Reason for Call: Talk to Nurse Summary of Call: PER DAUGHTER WAS TO CALL IF NOT HEARD BACK FROM [place] IN 1 WEEK Initial call taken by: Lorne Skeens,  July 07, 2009 12:18 PM  Follow-up for Phone Call        Spoke with pt's daughter regarding what treatment options Dr. Marikay Alar recommends for pt. Daughter Andrey Campanile states Dr. Marikay Alar was going to get in contact with Dr, Frederik Pear MD to determine the next step. I let pt's daughter know Dr. Marikay Alar is not in office today. Andrey Campanile would like for [facility] to call her today if possible. Ollen Gross, RN, BSN  July 07, 2009 12:48 PM  pt's daughter really needs to talk to you please call her today Omer Jack  July 07, 2009 4:02 PM       Additional Follow-up for Phone Call Additional follow up Details #1::        Spoke with daughter and Dr Chilton Si. Per Dr Chilton Si, pacemaker is reasonable for patient in his opinion.  Daughter is also agreeable.  Left message for Dr Graciela Husbands to call to verify that it is ok to schedule PPM for patient.  Pt has a history of c diff.  Need to ask Dr Graciela Husbands plan for antibiotics. Gypsy Balsam RN BSN  July 07, 2009 4:56 PM  D/W Dr Graciela Husbands, plan DC PPM, will use Vancomycin for antibiotics with history of c diff.  Spoke with daughter, she is aware of above.  Offered June 15th or 17th for implant.  She will check with work and call me back to let me know plan. Gypsy Balsam RN BSN  July 08, 2009 11:47 AM     Additional Follow-up for Phone Call Additional follow up Details #2::    PPM scheduled for 6-17.  Instruction sheet faxed to daughter. Gypsy Balsam RN BSN  July 08, 2009 2:51 PM

## 2010-03-04 NOTE — Progress Notes (Signed)
Summary: left hand swollen-no pain  Phone Note Call from Patient   Caller: Daughter sandy [phone] Reason for Call: Talk to Nurse Summary of Call: pt had at [facility]-coming in 10-14 days for wound ck s/p pacer-dtr calling to say pt's left hand is swollen-no discomfort at all-not concerned but wanted to let us know in case it may be a problem Initial call taken by: Glynda Jaeger,  July 20, 2009 1:00 PM  Follow-up for Phone Call        pacer site looks good, no swelling, reddness or drainge.  She does notice that pts hand is a little puffy and wanted [place] to be aware, she will cont. to monitor and let us know if it gets worse or if pacer site becomes swollen or red, will keep appt 6/27 Meredith Staggers, RN  July 20, 2009 1:24 PM

## 2010-03-04 NOTE — Consult Note (Signed)
Summary: [facility]   Imported By: Marylou Mccoy 08/07/2009 10:57:25  _____________________________________________________________________  External Attachment:    Type:   Image     Comment:   External Document

## 2010-03-04 NOTE — Procedures (Signed)
Summary: wound check left had swollen   Allergies: 1)  ! Asa  Comments:  Nurse/Medical Assistant: Per call from daughter, left hand swollen.  Minimal amount edema noted in left hand to wrist area where IV site was attempted in the hospital.  We will follow up as scheduled for wound check in 2 weeks.  The paitent was instructed to call us with any other concerns. Altha Harm, LPN (July 20, 2009 3:01 PM)

## 2010-03-05 ENCOUNTER — Telehealth: Payer: Self-pay | Admitting: Internal Medicine

## 2010-03-10 NOTE — Progress Notes (Addendum)
Summary: MEDICATION CLARIFICATION  Phone Note From Other Clinic Call back at [phone]   Caller: Nurse/ JOANNA FROM [facility] Request: Talk with Nurse, Talk with Provider Summary of Call: PT MEDICATION WAS CHANGED AT LAST VISIT...THEY NEED TO KNOW IF THE METOPROLOL 75MG  IS ER OR REGULAR BECAUSE SHE WAS ON ER SO THEY NEED CLARIFICATION Initial call taken by: Omer Jack,  March 05, 2010 9:51 AM  Follow-up for Phone Call        Mardene Celeste calls from [facility] regarding Metoprolol Tartrate. She says pt had been on Metoprolol Succinate prior to medication change at last visit. Pt BP currently: These are bp on Met. Tar. 03/03/10:   160/90  1 hr after med  150/80 hr 60 03/04/10:   172/92  hr 60  1 hr after med  158/74  hr60 03/05/10:   160/90  hr 76  ON ER METOPROLOL 50 BID  BP WERE AS FOLLOWS: 180/90 THE 1 HR AFTER 160-170/70-80 Mardene Celeste noted that with increase in Metoprolol Tartrate pt bp has not improved much. Does Dr. Graciela Husbands want pt to be switched back to ER Metropolol same dose 75 mg two times a day? I will forward to Dr. Graciela Husbands. She is aware he is out of the office today. Follow-up by: Lisabeth Devoid RN,  March 05, 2010 10:18 AM     Appended Document: MEDICATION CLARIFICATION our med rec form had it listed that she was taking metoprolol tartrate.  clarifying information from [facility] suggests that this was an error that she was taking succinate.   we will change the tartrate back to the succinate steve Shaqueta Casady

## 2010-03-10 NOTE — Cardiovascular Report (Signed)
Summary: Office Visit Remote   Office Visit Remote   Imported By: Roderic Ovens 03/01/2010 12:07:35  _____________________________________________________________________  External Attachment:    Type:   Image     Comment:   External Document

## 2010-04-01 ENCOUNTER — Encounter (INDEPENDENT_AMBULATORY_CARE_PROVIDER_SITE_OTHER): Payer: Medicare Other

## 2010-04-01 DIAGNOSIS — Z950 Presence of cardiac pacemaker: Secondary | ICD-10-CM

## 2010-04-01 DIAGNOSIS — I4891 Unspecified atrial fibrillation: Secondary | ICD-10-CM

## 2010-04-02 ENCOUNTER — Encounter: Payer: Self-pay | Admitting: Internal Medicine

## 2010-04-18 LAB — PROTIME-INR
INR: 2.33 — ABNORMAL HIGH (ref 0.00–1.49)
INR: 2.9 — ABNORMAL HIGH (ref 0.00–1.49)
Prothrombin Time: 25.4 seconds — ABNORMAL HIGH (ref 11.6–15.2)

## 2010-04-18 LAB — SURGICAL PCR SCREEN: Staphylococcus aureus: NEGATIVE

## 2010-04-19 ENCOUNTER — Encounter: Payer: Self-pay | Admitting: *Deleted

## 2010-04-29 NOTE — Letter (Signed)
Summary: Remote Device Check  [employer], Main Office  [address]   [place], [place] [address]   Phone: [phone]  Fax: [phone]     April 19, 2010 MRN: [id number]   [name] [id number] [place], [place]  [address]   Dear [name],   Your remote transmission was recieved and reviewed by your physician.  All diagnostics were within normal limits for you.  __X___Your next transmission is scheduled for:  05-06-2010.  Please transmit at any time this day.  If you have a wireless device your transmission will be sent automatically.   Sincerely,  Vella Kohler

## 2010-04-29 NOTE — Cardiovascular Report (Signed)
Summary: Office Visit   Office Visit   Imported By: Roderic Ovens 04/21/2010 09:37:04  _____________________________________________________________________  External Attachment:    Type:   Image     Comment:   External Document

## 2010-05-06 ENCOUNTER — Ambulatory Visit (INDEPENDENT_AMBULATORY_CARE_PROVIDER_SITE_OTHER): Payer: Medicare Other | Admitting: *Deleted

## 2010-05-06 DIAGNOSIS — I4891 Unspecified atrial fibrillation: Secondary | ICD-10-CM

## 2010-05-06 DIAGNOSIS — R0989 Other specified symptoms and signs involving the circulatory and respiratory systems: Secondary | ICD-10-CM

## 2010-05-06 DIAGNOSIS — Z950 Presence of cardiac pacemaker: Secondary | ICD-10-CM

## 2010-05-07 ENCOUNTER — Other Ambulatory Visit: Payer: Self-pay

## 2010-05-11 NOTE — Progress Notes (Signed)
Pacer remote check  

## 2010-05-23 ENCOUNTER — Encounter: Payer: Self-pay | Admitting: *Deleted

## 2010-06-15 NOTE — Assessment & Plan Note (Signed)
[facility]                            CARDIOLOGY OFFICE NOTE   [name], [name]               MRN:          [id number]  DATE:06/27/2007                            DOB:          24-Jun-1917    PRIMARY CARE PHYSICIAN:  Dr. Murray Hodgkins.   REASON FOR VISIT:  Follow up atrial fibrillation.   HISTORY OF PRESENT ILLNESS:  [name] is doing well.  She returns  for annual follow-up.  She has no major sense of palpitations or chest  pain.  She exercises using a NuStep on a regular basis.  Her  electrocardiogram today shows an ectopic atrial rhythm with heart rate  of 65 and nonspecific ST-T wave changes.  The atrial activity is more  organized in comparison to the previous tracing.  She continues on  Coumadin, has had no major bleeding problems or falls, and also on  metoprolol and Lanoxin.   ALLERGIES:  CODEINE.   MEDICATIONS:  1. Synthroid 150 mcg p.o. daily  2. Lanoxin 125 mg p.o. daily.  3. Multivitamin 1 p.o. daily.  4. Coumadin as directed to achieve an INR of 2.0 to 3.0.  5. Enablex 7.5 mg p.o. daily.  6. Metoprolol 50 mg p.o. b.i.d.  7. PreserVision b.i.d.   REVIEW OF SYSTEMS:  As described in the history of present illness.   EXAMINATION:  Blood pressure is 160/76, heart rate is 65 and regular,  weight is 152 pounds.  The patient is comfortable in no acute distress.  Examination of the neck reveals no elevated jugular venous pressure, no  loud bruits.  LUNGS:  Are clear without labored breathing.  CARDIAC EXAM:  Reveals a regular rate today.  Soft, systolic murmur.  No  S3 gallop.  No pericardial rub.  EXTREMITIES:  Exhibit no pitting edema.   IMPRESSION/RECOMMENDATIONS:  History of permanent atrial fibrillation,  atrial tachycardia, with good rate control on Toprol XL and digoxin.  She also continues on Coumadin without any major bleeding problems.  She  just recently turned 77 years old.  She is doing well, and I do  not  plan to make any specific changes today.  She is due to see Dr. Chilton Si  back in a week for routine physical, and we will plan to have her follow  up with Dr. Shirlee Latch in one year's time.     Jonelle Sidle, MD  Electronically Signed    SGM/MedQ  DD: 06/27/2007  DT: 06/27/2007  Job #: [id number]

## 2010-06-18 NOTE — H&P (Signed)
NAME:  [name], [name]                  ACCOUNT NO.:  [id number]   MEDICAL RECORD NO.:  [id number]                   PATIENT TYPE:  EMS   LOCATION:  ED                                   FACILITY:  [facility]   PHYSICIAN:  Soyla Murphy. Renne Crigler, M.D.               DATE OF BIRTH:  Feb 11, 1917   DATE OF ADMISSION:  01/25/2002  DATE OF DISCHARGE:                                HISTORY & PHYSICAL   HISTORY OF PRESENT ILLNESS:  The patient is an 77 year old widowed white  resident of [facility] admitted with a chief complaint of  I feel sick.   States she felt entirely well until this morning.  The previous day, her  daughter and son had come down with the vomiting and diarrhea illness.  This  morning, she had the same thing with sudden onset of vomiting, diarrhea, and  chills.  She was unable to hold anything down today, and was sent to the  emergency room for evaluation.  She still feels too ill to go home after  hydration, and she is continuing to vomit in the emergency room.  She has  not vomited any blood, and has some abdominal cramping, but no abdominal  pain, and denies any melena or hematochezia.  She has not had an illness  like this before.  There seems to be no clear exacerbating factors.   PAST MEDICAL HISTORY:  1. Hypothyroidism.  2. Hypertension.  3. Chronic urinary frequency.  4. Nocturia.   CURRENT MEDICATIONS:  1. PTU 50 mg p.o. q.a.m.  2. Benicar 40 mg p.o. q.d.  3. Detrol LA 4 mg p.o. q.a.m.  4. Toprol XL 50 mg p.o. q.a.m.   ALLERGIES:  CODEINE.   SOCIAL HISTORY:  She lives independently.  In fairly good health.  Uses  ethanol rarely and no tobacco.   FAMILY HISTORY:  Mother died of old age at the age of 93.  One daughter  died last year of breast cancer, [name].   REVIEW OF SYMPTOMS:  She has had no definite fevers or temperature  intolerance.  She has had some chills today.  No sore throat, sinus pain,  ear pain, change in hearing or  vision.  No headache, numbness, tingling,  seizures, syncope, or specific weakness.  She is generally weak.  She has no  difficulty swallowing.  The patient denies any hematochezia, no chest pain,  shortness of breath, cough, or palpitations.  She does have urinary  frequency and nocturia without hematuria or dysuria.  There is no muscle or  joint aches, or any skin changes.  She is in good spirits.   PHYSICAL EXAMINATION:  GENERAL:  She appears in good spirits.  Is in no  acute distress at present, but with full emesis basin on her lap.  VITAL SIGNS:  Blood pressure is 148/69 supine, 153/74 standing, temperature  97.8, pulse 93, respirations 20.  HEENT:  Within normal limits.  Pupils are equal.  Mucous membranes are dry.  NECK:  Within normal limits.  There is no cervical, supraclavicular, or  axillary adenopathy.  Supple, no thyromegaly, no neck masses.  SKIN:  Full skin examination and weight examination was not done as there is  no private room for examination tonight in the emergency department.  LUNGS:  Clear with a regular rhythm with no murmurs, rubs, or gallops.  ABDOMEN:  Nontender, bowel sounds present, no organomegaly, no masses.  EXTREMITIES:  No cardiac enzymes.  She does have 2+ equal carotids, 1+ equal  dorsalis pedis pulses.  Extremities do show bilateral hallux valgus.  PELVIC:  Not done at this time, I will plan to do this as an outpatient.  RECTAL:  Not done at this time, I will plan to do this as an outpatient.   IMPRESSION:  1. Acute gastroenteritis with dehydration.  We will treat with IV fluids     overnight.  Advance diet as tolerated.  2. Hypothyroidism.  3. Hypertension.  4. Chronic urinary frequency.  5. Family history of breast cancer.                                               Soyla Murphy. Renne Crigler, M.D.    WDP/MEDQ  D:  01/25/2002  T:  01/25/2002  Job:  [id number]   cc:   Brooke Bonito, M.D.  [address]  [place]  [place] [address]  Fax:  [phone]

## 2010-06-18 NOTE — H&P (Signed)
NAME:  [name], [name]                  ACCOUNT NO.:  [id number]   MEDICAL RECORD NO.:  [id number]                   PATIENT TYPE:  INP   LOCATION:  0159                                 FACILITY:  [facility]   PHYSICIAN:  Ollen Gross, M.D.                 DATE OF BIRTH:  07-30-1917   DATE OF ADMISSION:  10/13/2003  DATE OF DISCHARGE:                                HISTORY & PHYSICAL   CHIEF COMPLAINT:  Right knee pain.   HISTORY OF PRESENT ILLNESS:  The patient is an 77 year old female seen by  Dr. Lequita Halt for ongoing right knee pain.  She has a known history of end  stage arthritis.  She has been treated in the past with injections, however,  her knee has continued to become more severe in nature.  She has had  multiple injections in the past which have only helped temporarily.  She is  seen in the office where her x-rays do show end stage tricompartmental  degenerative changes.  She has arthritis in both knees, her right is more  symptomatic.  It is felt at this time she would benefit from undergoing a  knee replacement.  The risks and benefits have been discussed with the  patient and she elects to proceed with surgery.   ALLERGIES:  Aspirin and codeine.   CURRENT MEDICATIONS:  Diovan 160/25 daily, Centrum silver daily, Ocuvite  daily, Tylenol p.r.n., glucosamine chondroitin t.i.d., Synthroid 100 mcg  daily.   PAST MEDICAL HISTORY:  Hypertension, hypothyroidism, hypercholesterolemia,  osteoarthritis, macular degeneration, memory deficit, hiatal hernia,  diverticulosis, urinary incontinence, history of urinary tract infections.   PAST SURGICAL HISTORY:  Hiatal hernia procedure, childhood tonsillectomy,  lumpectomy, cataract extraction.   SOCIAL HISTORY:  Widowed, husband died of ALS.  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   FAMILY HISTORY:   Father deceased age 76, mother deceased age 23,  grandmother with a history of stroke, both parents with a history of  arthritis, also history of lung and breast cancer in the family.   REVIEW OF SYMPTOMS:  GENERAL:  No fevers, chills, night sweats.  NEUROLOGICAL:  Does have a history of macular degeneration, no seizures,  syncope, or paralysis.  RESPIRATORY:  No shortness of breath, productive  cough, or hemoptysis.  CARDIOVASCULAR:  No chest pain, angina, or orthopnea.  GI:  History of hiatal hernia.  She has had previous diarrhea and  diverticulosis, no complaints at this time.  GU:  She does have some urinary  frequency and nocturia with some occasional urinary incontinence, no dysuria  or hematuria.  MUSCULOSKELETAL:  As in history of present illness.   PHYSICAL EXAMINATION:  VITAL SIGNS:  Pulse 64, respirations 12, blood pressure 148/82.  GENERAL:  77 year old white female, well developed, well nourished, in no  acute distress, alert and oriented,  cooperative, appears to be a good  historian.  HEENT:  Normocephalic, atraumatic, pupils round and reactive, extraocular  movements intact, oropharynx clear.  NECK:  Supple, no bruits.  CHEST:  Clear anterior and posterior chest walls, no rhonchi, rales, or  wheezing.  HEART:  Regular rhythm, S1 and S2 noted, no rubs, thrills, murmurs.  RECTAL/BREASTS/GENITALIA:  Not done, not pertinent to present illness.  EXTREMITIES:  Right knee shows about a 20 degree valgus deformity  malalignment.  She does have marked crepitus noted on passive range of  motion.  She does have an unsteady gait pattern.  Motor function intact.  Left knee shows about a 10 degree valgus deformity, less crepitus as  compared to the right, no instability.   IMPRESSION:  1.  Osteoarthritis bilateral knees, right greater than left.  2.  Hypertension.  3.  Hypothyroidism.  4.  Hypercholesterolemia.  5.  Osteoarthritis.  6.  Macular degeneration.  7.  Memory  deficit.  8.  History of diverticulosis.  9.  Hiatal hernia.  10. Urinary incontinence.  11. History of urinary tract infections.   PLAN:  The patient is admitted to [facility] to undergo a right  total knee replacement arthroplasty.  The surgery will be performed by Dr.  Ollen Gross.     Alexzandrew L. Julien Girt, P.A.              Ollen Gross, M.D.    ALP/MEDQ  D:  10/16/2003  T:  10/16/2003  Job:  [id number]

## 2010-06-18 NOTE — Assessment & Plan Note (Signed)
[facility]                            CARDIOLOGY OFFICE NOTE   [name], [name]               MRN:          [id number]  DATE:05/29/2006                            DOB:          1917/07/09    PRIMARY CARE PHYSICIAN:  Lenon Curt. Chilton Si, M.D.   REASON FOR VISIT:  Followup atrial fibrillation.   HISTORY OF PRESENT ILLNESS:  I saw [name] back in November.  She  is here with her daughter and is doing quite well.  She has had no  problems with palpitations or chest pain.  She is tolerating her present  medical regimen well.  She has had no bleeding problems on Coumadin.  Her electrocardiogram today shows atrial fibrillation at 103 beats per  minute,  although my check of her heart rate today was in the 80s.  Electrocardiogram demonstrates atrial fibrillation with no significant  changes compared to previous tracing.   ALLERGIES:  CODEINE.   PRESENT MEDICATIONS:  1. Synthroid 150 mcg p.o. daily.  2. Lanoxin 125 mcg p.o. daily.  3. Multivitamins 1 p.o. daily.  4. Coumadin as directed by the Coumadin clinic.  5. Enablex 7.5 mg p.o. daily.  6. Metoprolol XL 50 mg p.o. b.i.d.  7. PreserVision b.i.d.   REVIEW OF SYSTEMS:  As described in History of Present Illness.   PHYSICAL EXAMINATION:  VITAL SIGNS:  Blood pressure 117/68, heart rate  84 and irregular.  Weight 146 pounds.  GENERAL:  The patient is comfortable and in no acute distress.  NECK:  No elevated jugular venous pressure.  Without bruits or  thyromegaly noted.  LUNGS:  Clear without labored breathing at rest.  CARDIAC:  Exam reveals an irregularly irregular rhythm.  EXTREMITIES:  Show no pitting edema.   IMPRESSION AND RECOMMENDATIONS:  1. Permanent atrial fibrillation.  Will plan to continue Toprol XL at      the present dose as well as Coumadin and Lanoxin.  She seems to be      tolerating this well.  Will see her back over the      next year.  2. Otherwise, continue  regular followup with Dr. Chilton Si.     Jonelle Sidle, MD  Electronically Signed    SGM/MedQ  DD: 05/29/2006  DT: 05/29/2006  Job #: [id number]   cc:   Lenon Curt Chilton Si, M.D.

## 2010-06-18 NOTE — Discharge Summary (Signed)
NAME:  [name], [name]                  ACCOUNT NO.:  [id number]   MEDICAL RECORD NO.:  [id number]                   PATIENT TYPE:  INP   LOCATION:  0353                                 FACILITY:  [facility]   PHYSICIAN:  Ollen Gross, M.D.                 DATE OF BIRTH:  01/03/1918   DATE OF ADMISSION:  10/13/2003  DATE OF DISCHARGE:  10/21/2003                                 DISCHARGE SUMMARY   ADMISSION DIAGNOSES:  1.  Osteoarthritis, bilateral knees, right more symptomatic than left.  2.  Hypertension.  3.  Hypothyroidism.  4.  Hypercholesterolemia.  5.  Osteoarthritis.  6.  Macular degeneration.  7.  Memory deficit.  8.  History of diverticulosis.  9.  Hiatal hernia.  10. Urinary incontinence.  11. History of urinary tract infections.   DISCHARGE DIAGNOSES:  1.  Osteoarthritis, right knee, status post right total knee arthroplasty.  2.  Osteoarthritis, left knee.  3.  Mild postoperative blood loss anemia.  4.  Postoperative hypokalemia, improved.  5.  Postoperative hyponatremia, improved.  6.  Postoperative tachycardia, new onset atrial fibrillation.  7.  Hypertension.  8.  Hypothyroidism.  9.  Hypercholesterolemia.  10. Osteoarthritis.  11. Macular degeneration.  12. Memory deficit.  13. History of diverticulosis.  14. Hiatal hernia.  15. Urinary incontinence.  16. History of urinary tract infections.   PROCEDURE:  The patient was taken to the OR on October 13, 2003, underwent  a right total knee arthroplasty.  Surgeon:  Ollen Gross, M.D.  Assistant:  Alexzandrew L. Perkins, P.A.-C.  Anesthesia:  Spinal.  Minimum blood loss.  Hemovac drain x1.  Tourniquet time 50 minutes at 300 mmHg.   CONSULTATIONS:  [employer], [employer],  M.D.   BRIEF HISTORY:  Ms. Osika is an 77 year old female with severe end-stage  arthritis of the right knee with severe valgus deformity.  Pain has been  refractory to nonoperative  management.  Now presents for a total knee  arthroplasty.   LABORATORY DATA:  CBC on admission:  Hemoglobin of 14.8, hematocrit of 44.1,  white cell count 6.9, red cell count 4.73, differential within normal  limits.  Serial H&H's were followed.  Postoperative hemoglobin 11.0 and  32.4, last noted H&H 10.2 and 30.1.  PT/PTT on admission 12.5 and 32,  respectively, and INR 0.9.  Serial protimes followed per Coumadin protocol,  last noted PT/INT 19.2 and 2.0.  Chemistry panel on admission:  Slightly  elevated BUN of 32.  Remaining chemistry panel all within normal limits.  Serial BMETs were followed.  Normal sodium at 130.  It did drop down to 130.  Sodium came back up to 133.  Potassium started out in normal range of 4.4  and got as low as 3.2.  Came back up to 3.6.  Calcium dropped from 9.6 to  8.2.  Blood gas taken on October 16, 2003, showed pH of 7.519, PCO2 of  33.2,  PO2 low at 61.2, bicarb of 26.9, total CO2 of 24.4, O2 saturation  93.3%.  Occult blood negative on October 16, 2003.  Cardiac enzyme panel  taken on October 16, 2003:  CK was elevated at 196, CK-MB normal at 2.5,  relative index normal at 1.3, troponin I normal at 0.04.  TSH level taken on  October 16, 2003, normal at 4.110.  Urinalysis preop negative.  Blood  group type O positive.   X-rays:  CT angiography chest taken on October 16, 2003:  No evidence of  pulmonary embolism, cardiomegaly with left atrial enlargement, mild chronic  interstitial lung disease.  No acute cardiopulmonary process.  Bronchiectasis, left lower lobe.  Small hiatal hernia.  Atrophic right  kidney.  Two-view chest on preop:  No active disease.   EKG:  Preoperative EKG October 07, 2003:  Normal sinus rhythm with first  degree AV block, premature supraventricular complexes, no significant change  since the last tracing, performed by Doylene Canning. Ladona Ridgel, M.D.  Follow-up EKG on  October 16, 2003:  Atrial fibrillation, rapid ventricular  response since  EKG.  Atrial fibrillation is now present, and ST changes are new.   HOSPITAL COURSE:  Patient admitted and taken to the operating room on  October 13, 2003, and underwent the above-stated procedure without  complication.  The patient tolerated the procedure well, later was  transferred to the recovery room and then to the floor for continued postop  care.  The first evening after surgery the patient had a miserable night.  Difficult pain control.  Hemovac drain was pulled on postop day 1.  Fluids  were KVO.  By day 2 she was doing a little bit better.  Pain was under  little bit better control, although she did have a fairly rough night.  Her  dressing was changed on postop day 2 and incision was healing well.  She was  noted to be a little hyponatremic.  The patient was placed on some fluid  restrictions and electrolytes were followed.  Started working with physical  therapy on day 2.  She was up out of bed on day 1, started to try to take a  few steps.  On day 2 she was walking about three feet.  Unfortunately, by  day 3 she was found on October 16, 2003, to be tachycardic although she  was asymptomatic.  She denied any chest pain, angina, or shortness of  breath.  EKG was taken and she was found to have new-onset atrial  fibrillation.  She was transferred to a telemetry floor for monitoring.  She  was given Cardizem IV bolus.  A medical consult was called.  Her medical  doctor is Lenon Curt. Chilton Si, M.D.  Rosanne Sack, M.D., was covering  through [facility].  The patient was found to be with  rapid ventricular rate with atrial fibrillation.  The patient was moved to  step-down and given IV Cardizem for her rate control.  She was also noted to  have low potassium, placed on potassium supplements.  She did have a history  of hypothyroidism.  TSH level was checked, which was found to be negative. Chest x-ray, EKG, CT chest, angiography, and  also labs were checked.  Cardiac enzymes were found to be normal.  CT was negative for PE.  Cardiac  workups were negative other than the new-onset atrial fibrillation.  She was  placed on p.o. and IV medications for rate control.  Her rate did  improve.  Potassium improved with supplementation.  Sodium also came up.  When she was  moved to step-down, therapy was placed on hold.  When she was doing a little  bit better and rate was improved, she moved out of step-down up to the third  floor telemetry unit.  Therapy was reinitiated, although she was moved up  over the weekend and did not receive any ambulatory therapy but was up out  of bed.  Dressing was continued to be changed daily, and the incision was  healing well, no signs of infection.  She did well thorough the weekend and  by the next week on Monday morning she was seen back in rounds with Dr.  Lequita Halt.  Her heart rate was under better control.  Her potassium had  improved.  She started getting up with physical therapy and ambulating about  three or four feet in her room.  Discharge planning was working along with  the patient.  She was a resident of [facility] [address].  They were working on  trying to find a skilled bed over at [facility] temporarily during  her rehab.  There were no beds available.  It was noted on Tuesday,  October 21, 2003, that a bed was available at [facility].  The bed  availability which was found out on Monday was discussed with the family,  and they were in agreement.  The patient was seen on rounds on Tuesday  morning by Dr. Lequita Halt.  She was noted to have a little bit increased rate,  otherwise she was asymptomatic.  A bed was noted to be available over at  [facility].  At the time of the dictation the patient had not been seen by  medical services.  Will go ahead and set up transfer for the patient pending  final okay per medical services.   DISCHARGE PLAN:  Patient transferred to [facility] facility  for continued  care.   DISCHARGE DIAGNOSES:  Please see above.   DISCHARGE MEDICATIONS:  Current medications at time of transfer include:   1.  Colace 100 mg p.o. b.i.d.  2.  Coumadin as per protocol, to be followed by the outpatient pharmacy.      Titrate INR between 2.0 and 3.0.  3.  Avapro 300 mg daily.  4.  Levothyroxine 100 mcg p.o. daily.  5.  Protonix 40 mg daily.  6.  Diltiazem (cardiac/Tiazac) 90 mg p.o. q.6h.  7.  Benadryl 25 mg p.o. q.6h. p.r.n. itching.  8.  Senokot S p.o. q.h.s. p.r.n.  9.  Reglan 10 mg p.o. q.8h. p.r.n. nausea.  10. Percocet one or two every four to six hours as needed for pain.  11. Tylenol one or two every four to six hours as needed for mild pain or      for headache.  12. Robaxin 500 mg p.o. q.6h. p.r.n. spasm.  13. Phenergan 25 mg p.o. q.6h. p.r.n. nausea.  14. Cepacol lozenges p.o. p.r.n.   ACTIVITY:  The patient needs to be out of bed b.i.d., ambulate daily with physical therapy.  Total knee protocol, weightbearing as tolerated to the  right lower extremity.  The patient may start showering.  Continue gait  training, ambulation, range of motion, and strength testing.  Strengthening  program for right total knee.   FOLLOW-UP:  Patient to follow up in the office in about two weeks.  Please  contact the office at [phone] to set up an appointment with Dr. Ollen Gross  for follow-up care.   DISPOSITION:  [facility].   CONDITION ON DISCHARGE:  Pending medical evaluation.  Overall improving.      ALP/MEDQ  D:  10/21/2003  T:  10/21/2003  Job:  [id number]   cc:   Lenon Curt. Chilton Si, M.D.  [address]  [place]  [place] [address]  Fax: [phone]   Rosanne Sack, M.D.  [address]  [place], [place] [address]  Fax: [phone]

## 2010-06-18 NOTE — Discharge Summary (Signed)
[name], [name]            ACCOUNT NO.:  [id number]   MEDICAL RECORD NO.:  [id number]          PATIENT TYPE:  INP   LOCATION:  5158                         FACILITY:  [facility]   PHYSICIAN:  Lonia Blood, M.D.DATE OF BIRTH:  11-Sep-1917   DATE OF ADMISSION:  11/09/2003  DATE OF DISCHARGE:  12/05/2003                           DISCHARGE SUMMARY - REFERRING   DISCHARGE DIAGNOSES:  1.  Severe life-threatening refractory Clostridium difficile colitis      1.  Treatment course complicated by the use of Keflex for a urinary          tract infection.      2.  Gastrointestinal and general surgery consultations.      3.  Pan-colitis with prolonged diarrhea, protein calorie malnutrition          and metabolic acidosis.      4.  Extensive course of p.o. vancomycin indicated, as ordered below.  2.  Escherich coli pyelonephritis      1.  Treated with a full course of Keflex.      2.  Resolved at discharge.  3.  Severe protein calorie malnutrition      1.  Albumin Nadir of approximately 1.2.      2.  Pre-albumin Nadir of approximately 7.0.      3.  Prolonged TNA dependence with n.p.o. status.      4.  Appetite stimulant initiated during the hospital stay.  4.  Third space volume loss, secondary to hypoalbuminemia - improving.  5.  Metabolic acidosis with severe hypokalemia, secondary to prolonged      colitis/diarrhea - resolved.  6.  Chronic atrial fibrillation with initial rapid ventricular response at      admission - controlled.  7.  Hypertension - controlled.  8.  Anemia of chronic disease - stable.  9.  Hypothyroidism.      1.  On hormone replacement.      2.  History of toxic goiter, status post ablation.  10. Hyperlipidemia.  11. 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  12. Macular degeneration.  13. Known diverticulosis with a history of diverticular bleed in September      2003.  14. Known hiatal hernia, status post repair of diaphragmatic  hernia.  15. Chronic urinary incontinence.  16. Known renal atrophy via a scanning in 2005.   DISCHARGE MEDICATIONS:  1.  Foradil Aerolizer 12 mcg capsule inhaled q.12h.  2.  Vancomycin 125 mg p.o. q.6h. x14 days, then reassess clinically.  3.  Synthroid 125 mcg p.o. daily.  4.  Digoxin 0.125 mg p.o. daily.  5.  Lopressor 25 mg p.o. b.i.d.  6.  Cardizem 60 mg p.o. q.6h.  7.  Lovenox 40 mg subcutaneously daily for deep venous thrombosis      prophylaxis, until the patient is ambulatory.  8.  Lasix 40 mg p.o. daily.  9.  Potassium chloride 40 mEq p.o. daily.  10. Protonix 40 mg p.o. b.i.d.  11. Marinol 2.5 mg p.o. b.i.d.  12. Questran 2 g p.o. b.i.d.  -  Can be discontinued when diarrhea resolves.  13. Zofran 4  mg p.o. q.6h. p.r.n.  14. Tylenol 650 mg p.o. q.4h. p.r.n.  15. Xanax 0.25 mg p.o. b.i.d. p.r.n.  16. Ultram 50 mg p.o. q.6h. p.r.n.   FOLLOWUP:  Ongoing care will be provided by the attending of record at the  patient's nursing home of choice.  The patient's volume status should be  followed closely.  The number of her diarrheal stools should be followed  closely.  Her nutritional status is very important.  The patient should be  encouraged to increase her intake as much as possible.  There is great  concern that the patient will not be able to reach her caloric needs in the  outpatient setting.  Electrolytes and a digoxin level should be obtained in  approximately five days, to assure that these remain balanced.  Care should  be taken that the patient is observed for evidence of recurrence of her  severe C. difficile colitis.   CONSULTATIONS:  1.  Dr. Judie Petit T. Russella Dar with [facility].  2.  Dr. Huey Bienenstock Gerkin with general surgery.   PROCEDURE:  A CT scan of the abdomen and pelvis on November 26, 2003, with  improving colitis involving the ascending and transverse colon.  Increased  small pleural effusion.  Tiny gallstones.  A CT scan of the abdomen on November 14, 2003,  with mild ileus with colonic  wall thickening/colitis, without change.  A CT scan of the abdomen and pelvis on November 13, 2003, with small bowel  effusions and bibasilar atelectasis.  Simple cyst in the liver.  Wall  thickening and inflammatory changes of the entire colon, consistent with C.  difficile colitis.  No pneumatosis intestinalis or portable venous gas.  A CT scan/angio of the chest on November 09, 2003, with no evidence for  pulmonary embolus.  Small bowel effusions.  A hiatal hernia.  Bilateral lower extremity Doppler evaluations:  Negative for evidence of  deep venous thrombosis on November 21, 2003.   HISTORY OF PRESENT ILLNESS:  The patient is a very pleasant 77 year old  female who was residing at [facility] in the skilled care unit.  She  presented to the hospital on November 09, 2003, after the nursing home staff  found her to be tachycardic.  The patient has a history of atrial  fibrillation with heart rates in the 100-160 range intermittently.  With  this the patient had developed some dyspnea at rest.  There has been no  chest pain or diaphoresis.  The patient also reported nausea with decreased  intake and some intermittent night time fevers.  Because of the  constellation of the patient's symptoms, it was felt that she was best  evaluated in the emergency room.  Of note, the patient had been treated  recently in the nursing home with Cipro and Levaquin for presumed urinary  tract infections.   HOSPITAL COURSE:  The patient was admitted to the hospital on November 09, 2003, because of tachycardia and the above-listed constitutional symptoms.  Though the patient was in atrial fibrillation and suffering with rapid  ventricular response, it was quickly discovered that she was also  significantly dehydrated.  Metabolic acidosis was appreciated.  Diarrhea was noted after the patient's admission.  Our concern was that the patient was  suffering with C. difficile  colitis.  A C. diff toxin was in fact positive.  A CT scan of the abdomen revealed severe diffuse colitis.  The patient was  maintained n.p.o., and Flagyl therapy was initiated.  Because of refractory  metabolic acidosis, GI was consulted.  [facility] followed the patient  during the majority of her hospitalization.  No flexible sigmoidoscopy or  other endoscopic intervention was felt to be appropriate.  The patient did  require a rectal tube at times during her hospitalization because of profuse  multi-episode diarrhea.  Out of concern that the patient might even require  a colon resection, general surgery was consulted.  Dr. Gerrit [facility] evaluated the  patient, but did not feel that such aggressive measures were required at  this time.  The decision was made to transition the patient to vancomycin  therapy, as she was not responding well to Flagyl.  Because of the patient's  prolonged n.p.o. status, TNA was initiated through her percutaneous  indwelling central catheter line.  The patient tolerated this well.  Unfortunately, the patient's C. difficile proved to be quite refractory to  treatment.  The treatment was also interrupted by the development of  recurrence of fevers.  A urinalysis revealed evidence of an E. coli  pyelonephritis.  Keflex therapy had to be initiated to treat this, but this  required restarting of the timing of the patient's vancomycin therapy, as  one cannot assume that vancomycin as well as Keflex are appropriately  treating the patient's C. diff.  Ultimately the patient did begin to improve.  Attempts were made to wean her  from TNA, and to advance her p.o. intake.  These attempts were very slow in  their progress.  Ultimately the routine was to place the patient on cyclic  TNA at night time only, providing approximately 50% of her caloric needs.  With directional nutritional consultation, daytime intake was encouraged.  Marinol was added to the patient's regimen, in an  attempt to stimulate her  appetite.  Advancement of diet was slow, and the patient's caloric intakes  by mouth were very limited.  In the early week of November we came to a critical standpoint.  The patient  was becoming severely depressed, because of her prolonged hospitalization.  The patient was tearful on multiple occasions and repeatedly begged to be  discharged back to [facility].  The patient's family had maintained her  bed at [facility], and we were assured that a skilled nursing level was  able to be provided initially, with decreased care needs as the patient  improved.  A critical point was reached on December 05, 2003.  At this time  the patient's caloric intake was still not sufficient to maintain her on  p.o. status alone.  She had made some modest improvements in her p.o.  intake, however, and this was somewhat encouraging.  After a long discussion with the patient's family and with the patient herself, the decision was  made that discharge was reasonable.  I have explained to the patient's  family as well as to the patient that technically she is not yet able to  take in enough calories to sustain herself longer-term.  I have explained  the sequela of malnutrition, and the fact that if she does not significantly  increase her intake in the outpatient setting, that she will simply return  to the hospital in short course.  It is my feeling, however, that the  patient's situational depression associated with her prolonged  hospitalization is worsening matters.  It is my hope, that transferring her  back to [facility] will place her in a more supportive environment for her  personal taste, and result in an alleviation  of some of her depression, and  hopefully therefore an increase in her appetite.  My fear is that if her  hospitalization is prolonged, she will withdraw completely, give up, and  remain completely dependent upon TNA or other measures for feeding.  The   family understands that transferring her back to [facility] is being  carried out because of the complex situation, and not wholly because I feel  that the patient is felt to be medically ready.  I have encouraged the  patient to eat as much as would like.  I have encouraged her that there  should be no restrictions concerning cholesterol, fat, or total calorie  intake.  We will continue Marinol.  She will be provided with food from her  family as well, from outside sources that she is known to like.  She will  need to be followed closely for evidence of progressive third space volume  loss, because she does remain significantly hypoalbuminemic.  The pre-  albumin should be reassessed in approximately one week, with a discharge pre-  albumin of approximately 7.  The patient has had multiple other chronic medical problems.  Though small  issues have arisen during her stay, these have for the most part been  stable.  On December 05, 2003, it is observed that her vital signs are  stable.  He is afebrile.  Her p.o. intake is slowly improving.  She is  liberated from oxygen.  Her heart rate is controlled and regular.  The above-  listed medical regimen is that which is recommended for the outpatient  setting.  Again, the primary issue we will stressing is nutritional intake.  The patient will also require extensive physical therapy to regain her  strength.      Trey Paula   JTM/MEDQ  D:  12/05/2003  T:  12/05/2003  Job:  [id number]

## 2010-06-18 NOTE — Op Note (Signed)
[facility]  Patient:    [name], [name]                   MRN: [id number] Proc. Date: 08/06/99 Adm. Date:  [id number] Attending:  Feliciana Rossetti                           Operative Report  PREOPERATIVE DIAGNOSIS:  Large hiatal hernia with previous gastric obstruction.  POSTOPERATIVE DIAGNOSIS:  Large hiatal hernia with previous gastric obstruction.  OPERATION:  Repair of hiatal hernia and tube, gastrostomy.  SURGEON:  Zigmund Daniel, M.D.  ASSISTANT:  Adolph Pollack, M.D.  ANESTHESIA:  General.  DESCRIPTION OF PROCEDURE:  After the patient was adequately anesthetized and monitored, a Foley catheter was inserted and after routine preparation and draping of the abdomen, I made an upper midline incision going just to the left of the xiphoid process and stopping just above the umbilicus. I entered the peritoneal cavity and explored it. I found the liver was free of any abnormality and that the small bowel was normal and that the colon had diffuse diverticulosis without any evidence of inflammation. There was a very large hiatal hernia with a defect about 6-7 cm across. The stomach could easily be pulled back down into the abdomen and there were no other viscera in the hernia at the time. After pulling the stomach down and taking down a few of the adhesions of the left part of the stomach to the hernia sac and undersurface of the liver, I incised the lesser omentum just along the lesser curve of the stomach and made a window posterior to the esophagus. There was a nasogastric tube in place in the esophagus and I took great pains to avoid any injury to the esophagus at all times. I identified the right crus of the diaphragm. I reduced the hernia sac and excised it all around the crura of the diaphragm until I had entirely removed the hernia sac and I clearly saw the esophagus and the gastroesophageal junction and reduced the stomach  nicely within the abdominal cavity and had several centimeters of intra-abdominal esophagus. Hemostasis was good. There were two small capsular tears of the spleen which I controlled with Surgicel and pressure and with cautery. Bleeding was never a problem during the operation. I repaired the esophageal hiatus by taking large bites of the crura with the auto suture endostitch device and heavy braided suture putting 3 sutures anterior to the esophagus and 1 posterior to the esophagus. With the nasogastric tube in the esophagus, the remaining hole in the diaphragm admitted the tip of the finger. I felt that was about right in its tightness. It appeared to be a very strong repair. I then made a hole in the abdominal wall just below the left rib margin and passed a 24 French Foley catheter through. I put a pursestring #0 Vicryl suture in the anterior wall of the stomach not far from the gastroesophageal junction and made a hole in the middle of the pursestring, put in the Foley catheter and inflated the balloon and tied the pursestring. I drew that up to the abdominal wall and held it in place there by 2 additional 2-0 silk sutures. I sutured the catheter to the skin with 2-0 silk as well. Sponge, needle and instrument counts were correct. I closed the abdomen with running #1 PDS and with staples. The patient was stable throughout the operation. DD:  08/06/99 TD:  08/09/99 Job: [id number]

## 2010-06-18 NOTE — H&P (Signed)
NAME:  [name], [name]                  ACCOUNT NO.:  [id number]   MEDICAL RECORD NO.:  [id number]                   PATIENT TYPE:  INP   LOCATION:  0347                                 FACILITY:  [facility]   PHYSICIAN:  Iva Boop, M.D. [facility]           DATE OF BIRTH:  1917/06/23   DATE OF ADMISSION:  09/28/2001  DATE OF DISCHARGE:                                HISTORY & PHYSICAL   PRIMARY CARE PHYSICIAN:  Brooke Bonito, M.D.   PRIMARY GASTROENTEROLOGIST:  Georgiana Spinner, M.D.   CHIEF COMPLAINT:  Rectal bleeding.   HISTORY OF PRESENT ILLNESS:  This is a pleasant 77 year old widowed white  woman who recently was diagnosed with hyperthyroidism.  She developed  diarrhea over the past couple of months, and she has been weak and had  tremor, and ended up having an elevated T4 and T3, and depressed TSH.  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  This evening she ate a meal of ribs at [employer], and at  7 p.m. had sudden onset of bright red blood per rectum.  This was painless.  No nausea or vomiting or melena.  She takes an aspirin a day, but no other  significant nonsteroidal use except for occasional [employer]ritis [employer]  medication.  She had recurrent episodes, and presented to the emergency  department where she was evaluated.  She has had two or three more bloody  bowel movements here which are bright red in color.  There is no pain.  She  has not been hypotensive.  She has never had problems like this before.  She  has had a sigmoidoscopy in the past, and does believe she has  diverticulosis.  She has had a hiatal hernia repair, but denies any  dysphagia.  She had a subsequent ventral hernia repair last year.  She has  not had dizziness, though she has been somewhat weak related to her  hyperthyroidism.  No presyncope symptoms reported.  No chest pain  or  respiratory difficulty.   MEDICATIONS:  1. Toprol XL 50 mg q.d.  2. Aspirin q.d.  3. Unknown antihypertensive.   She took Lotrel for years, but this was changed recently.   ALLERGIES:  CODEINE.   PAST MEDICAL HISTORY:  1. As above.  2. Hypertension.  3. Cataract surgery.   SOCIAL HISTORY:  Widowed.  Her son-in-law is here today.  No tobacco or  alcohol.   FAMILY HISTORY:  No GI problems.  Her daughter did die from breast cancer  last year.   REVIEW OF SYMPTOMS:  A comprehensive review of systems is reviewed with the  patient.  She recently had a fall and some soft tissue trauma.  She has been  weak and anorexic.  She has lost 10 pounds since the diarrhea and appetite  change started.  All other  systems are negative at this time.   PHYSICAL EXAMINATION:  GENERAL:  A pleasant elderly white female in no acute  distress.  VITAL SIGNS:  Temperature 97.7, blood pressure 162/84, pulse 80, respiratory  rate 18.  HEENT:  Eyes are anicteric.  Mouth shows partial dentures, otherwise good  normal oral and posterior pharyngeal mucosa.  NECK:  Supple without mass.  I do not detect any obvious thyromegaly.  The  neck is nontender.  CHEST:  Clear.  HEART:  S1 and S2, no murmurs, rubs, or gallops.  Regular rate and rhythm.  No jugular venous distention.  ABDOMEN:  Soft and nontender without organomegaly or mass.  Bowel sounds are  present, but not increased.  RECTAL:  Deferred at this time.  Dr. Stacie Acres performed one and saw pure blood,  and the patient has had another bloody stool while I am here.  This is  essentially a small volume pure bloody bowel movement.  LYMPH NODES:  No neck, supraclavicular, or groin adenopathy detected.  EXTREMITIES:  Trace peripheral edema.  She does have varicose veins.  SKIN:  Pale with a few ecchymotic areas.  MUSCULOSKELETAL:  She has no gross deformity other than some minor  osteoarthritic changes of the hands.  MENTAL STATUS:  She is alert and  oriented x3.  NEUROLOGIC:  Grossly nonfocal.  I see no tremor tonight.   LABORATORY DATA:  Pending at this time.   ASSESSMENT:  1. Gastrointestinal bleeding, suspect diverticula, though AVM's or tumor are     possibilities.  2. Hyperthyroidism.  Side effects of this are under control with beta     blocker at this time.   PLAN:  1. Admit the patient with hydration, serial hemoglobins.  2. Colonoscopy when ready, perhaps in approximately 24 to 36 hours.  Sooner     if needed.  That is my plan at this time.  3. We will discuss the patient with Dr. [name] regarding radioactive iodine     therapy that is scheduled, and possible change in that plan given these     problems.  4. We will continue beta blockade at this time with metoprolol 25 mg b.i.d.     We will hold that if she has significant hypotension or pulse less than     50.   I appreciate the opportunity to care for this patient.                                                Iva Boop, M.D. [facility]    CEG/MEDQ  D:  09/29/2001  T:  09/29/2001  Job:  [id number]   cc:   Brooke Bonito, M.D.  [address]  [place]  [place] [address]  Fax: [phone]

## 2010-06-18 NOTE — Discharge Summary (Signed)
   NAME:  [name], [name]                  ACCOUNT NO.:  [id number]   MEDICAL RECORD NO.:  [id number]                   PATIENT TYPE:  INP   LOCATION:  0449                                 FACILITY:  [facility]   PHYSICIAN:  Soyla Murphy. Renne Crigler, M.D.               DATE OF BIRTH:  1917-07-13   DATE OF ADMISSION:  01/25/2002  DATE OF DISCHARGE:  01/26/2002                                 DISCHARGE SUMMARY   LABORATORY DATA:  Initial white blood cell count 11.6, hemoglobin 14.8, with  a left shift present.  Electrolytes were normal with a BUN of 34, creatinine  1.2, and glucose 150.  Urinalysis showed 7 to 10 white cells and many  bacteria, nitrites positive.   HISTORY OF PRESENT ILLNESS:  Please see admission history and physical for  details of the patient's presentation.   HOSPITAL COURSE:  She had vomiting and diarrhea, similar of that of her  family members.  She was treated with IV fluids, and diet was advanced.  The  following day, she stated  I feel fine, and was discharged.   IMPRESSION:  1. Acute gastroenteritis.  2. Dehydration secondary to above.  3. Hypothyroidism.  4. Hypertension.  5. Chronic urinary infrequency.  6. Family history of breast cancer.   DISPOSITION:  She was discharged to home on the following medications.   DISCHARGE MEDICATIONS:  Cipro XR 500 mg p.o. daily x4 days.   DISCHARGE INSTRUCTIONS:  No dairy products for five days.   FOLLOWUP:  She will follow up in the office in two weeks.   All of her other medications are the same as those on admission.                                               Soyla Murphy. Renne Crigler, M.D.    WDP/MEDQ  D:  02/19/2002  T:  02/19/2002  Job:  [id number]

## 2010-06-18 NOTE — Op Note (Signed)
NAME:  [name], [name]                  ACCOUNT NO.:  [id number]   MEDICAL RECORD NO.:  [id number]                   PATIENT TYPE:  INP   LOCATION:  0008                                 FACILITY:  [facility]   PHYSICIAN:  Ollen Gross, M.D.                 DATE OF BIRTH:  1917-11-29   DATE OF PROCEDURE:  10/13/2003  DATE OF DISCHARGE:                                 OPERATIVE REPORT   PREOPERATIVE DIAGNOSIS:  Osteoarthritis, right knee.   POSTOPERATIVE DIAGNOSIS:  Osteoarthritis, right knee.   PROCEDURE:  Right total knee arthroplasty.   SURGEON:  Ollen Gross, M.D.   ASSISTANT:  Alexzandrew L. Julien Girt, P.A.   ANESTHESIA:  Spinal.   ESTIMATED BLOOD LOSS:  Minimal.   DRAINS:  Hemovac x 1 .   TOURNIQUET TIME:  50 minutes at 300 mmHg.   COMPLICATIONS:  None.   CONDITION:  Stable to the recovery room.   BRIEF CLINICAL NOTE:  Ms. Thaker is an 77 year old female with severe end  stage osteoarthritis of the right knee with severe valgus deformity and pain  refractory to nonoperative management including injections.  She presents  now for right total knee arthroplasty.   PROCEDURE IN DETAIL:  After successful administration of spinal anesthetic,  a tourniquet was placed high on the right thigh and the right lower  extremity prepped and draped in the usual sterile fashion.  The extremity  was wrapped in Esmarch, knee flexed, and tourniquet inflated to 300 mmHg.  A  standard midline incision was made with a 10 blade through the subcutaneous  tissue to the level of the extensor mechanism.  A fresh blade was used to  make a lateral parapatellar arthrotomy given her significant valgus  deformity.  The soft tissue over the proximal lateral tibia was  subperiosteally elevated at the joint line with the knife and the IT band is  elevated with a Cobb elevator.  The patella was then everted medially and  the knee flexed 90 degrees and the ACL and PCL removed.  A drill was used  to  create a starting hole in the distal femur and the canal was irrigated.  A 5  degree right valgus alignment guide is placed and referencing off the  condyle rotation is marked with a black pain to remove 10 mm of the distal  femur.  Distal femoral resection is made with an oscillating saw.  A sizing  block is placed and a size 3 is the most appropriate.  The rotation is  marked off the epicondylar axis.  A size 3 cutting block was placed and  anterior and posterior cuts made.  The tibia was subluxed forward and the  menisci were removed.  The extramedullary tibial alignment guide was placed  referencing proximally at the medial aspect of the tibial tubercle and  distally along the second metatarsal axis and tibial crest.  The block is  pinned to remove 10 mm off the  deficient lateral side.  Tibial resection is  made with an oscillating saw.  A size 3 is also the most appropriate tibial  component.  The femoral preparation is then completed with the intercondylar  and chamfer cuts.   A size 3 posterior stabilized femoral trial, size 3 fixed bearing tibial  trial, and a 10 mm fixed bearing posterior stabilized insert was placed.  Full extension was achieved with excellent varus and valgus balance  throughout full range of motion.  The rotation is marked and the patella  then everted.  The thickness is measured to be 21 mm, free hand resection  taken to 12 mm, the 38 template was placed, lug holes were drilled, trial  patella was placed, and it tracked normally.  The osteophytes were then  removed off the posterior femur with the trial in place.  The proximal tibia  is then prepared with the modular drill and keel punch for the size 3.  The  cut bone surfaces are prepared with pulsatile lavage and the cement was  mixed.  Once we were ready for implantation, size 3 fixed bearing tibial  tray, size 3 posterior stabilized femur, and 38 patella are cemented in  place, the patella held with a  clamp.  A trial 10 mm insert is placed with  the knee held in full extension and all excess cement is removed.  Once the  cement was fully hardened, a permanent 10 mm fixed bearing posterior  stabilized insert is placed into the tibial tray.  The wound was copiously  irrigated with antibiotic solution and the tourniquet released with a total  time of 50 minutes.  Minor bleeding was stopped with cautery.  The lateral  arthrotomy was closed with interrupted #1 PDS but is left open from inferior  to superior pole of the patella to serve as a lateral release.  The flexion  against gravity was 135 degrees and the patella tracked perfectly normally.  The subcu was closed with interrupted 2-0 Vicryl, the subcuticular with  running 4-0 Monocryl.  The incision was cleaned and dried, Steri-Strips were  applied.  The drain was hooked to suction.  A bulky, sterile dressing was  applied.  She was then put in a knee immobilizer, awakened, and transferred  to the recovery room in stable condition.                                               Ollen Gross, M.D.    FA/MEDQ  D:  10/13/2003  T:  10/13/2003  Job:  [id number]

## 2010-06-18 NOTE — Discharge Summary (Signed)
[facility]  Patient:    [name], [name]               MRN: [id number] Adm. Date:  [id number] Disc. Date: [id number] Attending:  Brandy Hale                           Discharge Summary  FINAL DIAGNOSES: 1. Ventral incisional hernia. 2. Status post repair of large diaphragmatic hernia. 3. Hypertension. 4. Urinary incontinence.  OPERATION PERFORMED:  Laparoscopic ventral incisional hernia repair with dual Gore-Tex mesh dated 04/21/00.  HISTORY OF PRESENT ILLNESS:  This is a 77 year old white female who underwent exploratory laparotomy and repair of a huge diaphragmatic hernia by Dr. Lebron Conners in July 2001.  She has enjoyed good result of that with excellent swallowing function, no pain and no reflux.  She developed a moderately large incisional hernia at the lower end of her upper midline incision which has been getting larger and is beginning to cause some pain. But, she has not had any nausea, vomiting, or obstructive symptoms.  She does not want to have any problems with bowel obstruction and referred herself for consideration of elective hernia repair.  PAST MEDICAL HISTORY:  Significant for hypertension, arthritis, the diaphragmatic hernia repair, urinary incontinence, and diverticulosis.  CURRENT MEDICATIONS: 1. Latril one tablet daily. 2. Glucosamine daily.  DRUG ALLERGIES:  CODEINE.  PHYSICAL EXAMINATION:  GENERAL:  A pleasant older woman who is alert, oriented, and appears fit for her age.  HEAD AND NECK:  Exam unremarkable, no mass.  LUNGS:  Clear.  HEART:  Regular rate and rhythm, no murmur.  ABDOMEN:  Soft, nontender.  There is an upper midline incision.  Liver and spleen not enlarged.  There is a large but completely reducible ventral hernia beginning at the umbilicus and extending superiorly with the defect at least 6 cm in a cephalad direction and the hernia sac is larger.  HOSPITAL COURSE:  On the day  of admission, the patient was taken to the operating room.  She underwent diagnostic laparoscopy and takedown of her adhesions, and we were able to perform laparoscopic repair of her ventral hernia with a 22 x 18 cm sheet of dual Gore-Tex mesh.  Postoperatively, the patient did reasonably well.  She got up and walking the next day and was tolerating some liquids.  She was constipated and required a laxative.  She advanced in her diet and activities slowly but steadily and consistent with her advanced age.  We gave her some more laxative on March 25 and she had a good bowel movement and her appetite improved.  She requested that she be discharged on March 25 and, considering the fact that she was doing quite well, we allowed her to go home.  Discharge medications include Vicodin for pain.  She was to continue her usual medications.  She was asked to follow up with me in the office in three to four days to check her wound. She was advised to do no lifting, no strenuous activity, and no sports. DD:  05/08/00 TD:  05/08/00 Job: [id number]

## 2010-06-18 NOTE — Consult Note (Signed)
[facility]  Patient:    [name], [name]                   MRN: [id number] Proc. Date: 08/02/99 Adm. Date:  [id number] Attending:  Feliciana Rossetti CC:         Feliciana Rossetti, M.D.             Venita Lick. Pleas Koch., M.D. [facility]                          Consultation Report  REASON FOR CONSULTATION:  Diaphragmatic hernia.  HISTORY OF PRESENT ILLNESS:  This is an 77 year old white female without significant prior GI problems.  Since Christmas of 2000 she has had four distinct episodes of postprandial epigastric pain, nausea and vomiting.  She will tend to have bilious vomiting and also vomits some food.  The episodes will tend to occur after large meals, will last about 24 hours and then resolve.  She has some diarrhea during the episodes of pain, but otherwise has normal gastrointestinal  function in between.  She was admitted on June 30th after a more prolonged episode of about 36 hours nd then her symptoms resolved promptly.  She feels fine and is asymptomatic today.  She has no history of heartburn or swallowing difficulty otherwise.  PAST HISTORY:  Hypertension.  Urinary incontinence.  Diverticulosis.  She has not have any surgical problems in the past.  She has not had any other GI problems n the past.  CURRENT MEDICATIONS:  Ditropan XL 5 mg q.d.  Reglan 5 mg t.i.d.  DRUG ALLERGIES:  None known.  SOCIAL HISTORY:  She is a nondrinker and nonsmoker.  She lives in [place] and is self-sufficient regarding daily activity.  FAMILY HISTORY:  Noncontributory.  REVIEW OF SYSTEMS:  Otherwise noncontributory.  No cardiac history.  PHYSICAL EXAMINATION:  GENERAL APPEARANCE:  Pleasant elderly female who is alert and oriented, and in o distress.  VITAL SIGNS:  She has an irregular heart rhythm, but is not tachycardic.  HEENT:  Sclerae clear.  Extraocular movements intact.  NECK:  Supple.  Nontender.  No bruits.  No lymph nodes.  HEART:   Irregular rhythm, normal rate.  No murmurs.  LUNGS:  Clear to auscultation.  ABDOMEN:  Soft, nontender and no mass.  Liver and spleen not enlarged.  No hernias.  No distention.  RECTAL:  Stool is Hemoccult negative per GI consultation.  ADMISSION DATA:  Chest x-ray shows a large retrocardiac air-fluid level consistent with a large hiatal hernia.  Ultrasound in April 2001 was negative.  CT scan April 2001 shows a large diaphragmatic hernia containing stomach and colon.  Upper endoscopy  August 02, 1999 shows intrathoracic stomach, but normal mucosa.  Question of a deformed pylorus.  Some retained foods.  LABORATORY WORK:  White blood cell count 12,800 and hemoglobin 14.4.  Potassium  3.1, sodium 141 and glucose 151.  Liver function tests normal.  Urinalysis shows positive for nitrites, many bacteria, but no white cells.  IMPRESSION: 1. Large diaphragmatic hernia, probably type 3.  Suspect intermittent gastric    volvulus and/or intermittent obstruction of the lower gastrointestinal tract  secondary to the hernia. 2. Irregular heart rhythm, needs better definition. 3. Hypertension. 4. Urinary incontinence.  PLAN: 1. I agree with Dr. Raynelle Dick plan of obtaining an upper GI and small    bowel follow through tomorrow. 2. She will almost certainly need elective surgical repair of  her diaphragmatic  hernia in the future.  The timing of this surgical intervention has yet to    be determined. 3. She will need better definition of cardiac status preop.  Consider cardiology    consultation. 4. I will follow along with you as her workup proceeds. DD:  08/02/99 TD:  08/03/99 Job: [id number]

## 2010-06-18 NOTE — H&P (Signed)
[name], [name]            ACCOUNT NO.:  [id number]   MEDICAL RECORD NO.:  [id number]          PATIENT TYPE:  INP   LOCATION:  1825                         FACILITY:  [facility]   PHYSICIAN:  Lenon Curt. Chilton Si, M.D.  DATE OF BIRTH:  [birth year]   DATE OF ADMISSION:  11/09/2003  DATE OF DISCHARGE:                                HISTORY & PHYSICAL   CHIEF COMPLAINT:  Heart was beating too fast today.   HISTORY OF PRESENT ILLNESS:  An 77 year old white female currently resides  in [facility] in the skilled care unit and is under the primary care  of Dr. Murray Hodgkins.  She is now admitted through [facility] Emergency  Room following ambulance transport there from her nursing home for  evaluation and treatment of tachycardia.  The patient had uncontrolled  rates of 100-160 BPM since November 08, 2003.  She complained of dyspnea at  rest yesterday and then again today.  There has been no chest pain or  diaphoresis associated with rapid heart rate.  The patient has had nausea  each morning for several days and also has had nocturnal fevers, up to 100.3  degrees, with some nocturnal diaphoresis associated with the fevers.  These  have been attributed to urinary tract infection for which she was receiving  Cipro, recently changed to Levaquin.   She was last hospitalized here on October 13, 2003 through October 21, 2003, for right total knee replacement for degenerative arthritis resulting  in severe valgus deformity and pain.  The patient had postoperative new  problem of atrial fibrillation with rapid ventricular response.  This was  brought under control with Cardizem and she was discharged on this drug and  subsequently maintained on it.  She also was put on Lopressor 50 mg b.i.d.  This was reduced to 25 mg b.i.d. because of some complaints of respiratory  difficulty and wheezing approximately 3 days ago.  The patient had  bronchitis at that point in time, diagnosed and she  was treated with  albuterol inhaler a couple of times.  The inhaler made her heart race and it  was last used yesterday about midday and has not been used since then.   Chest x-ray done November 07, 2003 showed cardiomegaly and a small amount of  lung pleural fluid but no evidence consistent with congestive heart failure  or pulmonary vascular congestion.   She remains on Coumadin and the last INR done a few days ago was 3.0.   PAST MEDICAL HISTORY:  1.  Hypertension.  2.  Hypothyroidism compensated with levothyroxine.  3.  History of toxic goiter with subsequent ablation and hypothyroid state      as noted above.  4.  Hyperlipidemia.  5.  Osteoarthritis..  6.  Osteoporosis.  7.  Kyphosis of thoracic spine.  8.  Macular degeneration.  9.  Chronic diarrhea.  10. Diverticulosis.  11. Diverticular bleed October 03, 2001.  12. Asthma.  13. Mild cognitive deficit.  14. Bronchiectasis left lower lobe noted on x-ray 2/005.  15. History of hiatal hernia with repair.  16. Urinary incontinence.  17. Recurrent urinary tract infections.  18. October 13, 2003, new onset atrial fibrillation.  19. September 2003, history of ablation of toxic goiter with radioactive      iodine.  20. 2005, right renal atrophy.   PRIOR SURGERY:  1.  July 2001, repair of a hiatal hernia by Dr. Orson Slick.  2.  March 2002, repair of diaphragmatic and ventral hernia by Dr. Claud Kelp and Dr. Orson Slick.  3.  Childhood tonsillectomy.  4.  History of breast lumpectomy.  5.  Cataract extractions.  6.  October 13, 2003 right total knee replacement.   PROCEDURES:  1.  2001, CT chest, normal.  2.  July 2001, upper GI series:  Large hiatal hernia.  3.  April 2001, IVT:  Normal.  4.  April 2001, CT of the abdomen and pelvis:  Diverticulosis, hiatal      hernia.  5.  April 2001, ultrasound of the abdomen:  Small hemangioma of the liver.  6.  September 2003, radioactive iodine therapy for ablation of toxic  goiter.  7.  July 2001, EGD by Dr. Claudette Head, hiatal hernia, gastric valvules.  8.  October 16, 2003, 2D echocardiogram:  Basically normal.  9.  October 16, 2003, CT angiogram:  No pulmonary embolus.   CONSULTATIONS:  Partial listing:  1.  Satira Anis, gastroenterology.  2.  Lebron Conners, general surgery, Willa Rough, cardiology.   MEDICATIONS:  1.  Cardizem CD 240 mg 1 daily.  2.  Lopressor 50 mg b.i.d.  stopped on November 07, 2003 at which time 25 mg      was initiated.  3.  Coumadin 2 mg daily.  4.  Protonix 40 mg daily.  5.  Lasix 20 mg daily.  6.  K-Dur 10 mEq daily.  7.  Digoxin 0.125 mg daily.  8.  Avapro 300 mg daily.  9.  Levothyroxine 100 mcg daily.  10. Ocuvite 1 twice daily.  11. Percocet as needed for pain.  12. Albuterol nebulizer every 4 hours if needed for shortness of breath, has      only been used twice, last use 24 hours ago.  13. Dramamine 50 mg if needed for nausea.  14. Tylenol 325 mg 2 tabs q.4h p.r.n. pain, headache or temps.   ALLERGIES:  CODEINE, ASPIRIN.   SOCIAL HISTORY:  Widowed.  Husband died of ALS.  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  She is retired from this position.  There is  no tobacco use.  She rarely uses alcohol and only then a small glass of  wine.  Her emergency contact:  Andrey Campanile and [place], [phone] or [phone].   FAMILY HISTORY:  Father died age 69, cause unknown.  Mother died at age 4,  cause unknown.  One daughter died with cancer of the breast   REVIEW OF SYSTEMS:  GENERAL:  Weight has been reasonably stable.  She is  moderately anxious on a chronic basis.  She does give a reasonably good  history.  HEENT:  Wears prescription lenses for reading.  PULMONARY:  See  history of present illness.  No hemoptysis or sputum production at this time.  HEART:  See history of present illness.  No history of prior  myocardial  infarction, no angina.  GI:  Has had some recent nausea.  She  also has loose stools about 3 times per  day.  This is chronic problem she  says, no blood in the stool, cramping or abdominal pain.  GENITOURINARY:  Recurrent urinary tract infection with a recent UTI treated with Cipro.  No  current dysuria, has some incontinence on a persistent basis.  MUSCULOSKELETAL:  Recent right TKR, generalized arthralgias, osteopenia by  history.  NEUROLOGIC:  Mild tremor, mild cognitive deficit.  CIRCULATION:  Has cold feelings in feet and hands at times.  ENDOCRINE:  As noted above,  had a history of toxic goiter treated with radioactive iodine ablation and  now with compensated hypothyroidism.  No history of diabetes mellitus.  SKIN:  Generalized dryness.   PHYSICAL EXAMINATION:  Temperature 100.2, pulse 120-160, irregular.  Blood  pressure 159/82 and 145/92.  GENERAL:  Mildly overweight, elderly female who is alert and oriented,  cooperative with the exam.  SKIN:  Dry, no petechiae, ecchymoses, or other abnormal lesions.  HEAD:  Eyes, ears, nose and throat:  Sclerae white, conjunctiva clear,  pupils equal, round and reactive to light and accommodation.  Extraocular  movements full.  Left intraocular lens implant.  Oropharynx appears normal.  Hearing may be mildly diminished.  External auditory canals and TMs grossly  normal.  NECK:  No thyromegaly, nodules, mass, bruit or adenopathy.  CHEST:  There is no evidence of wheezing or rales at present time.  She is  mildly tachypneic at rest on oxygen.  She is coughing some but  nonproductively.  HEART:  Atrial fibrillation with rapid ventricular response, no gallop,  murmur, click, rubs, heave or thrill.  BREAST:  Atrophic, nontender, no nodules.  ABDOMEN:  No organomegaly, mass, bruit.  GENITAL AND RECTAL:  Exams deferred at this point due to pain in the knee  and patient preference.  MUSCULOSKELETAL:  Healing scar of the right knee, is able to flex  it  approximately 70 degrees.  There is some increased rigidity at the right  knee.  Other joints are not acutely inflamed although many are mildly  tender.  NEUROLOGIC:  Cranial nerves appear to be intact.  There is mild tremor of  extended hands.  She has mild cognitive deficit.  CIRCULATION:  Diminished dorsalis pedis and posterior tibia pulses, mild  varicosities bilaterally and approximately 1+ edema superimposed on chunky  ankles bilaterally.   LABORATORIES:  Hemoglobin 11.2, white count 13,500, INR 3.3, myoglobin 151  (normal).  CK-MB 1.3, Troponin less than 0.05.  Comprehensive metabolic  panel normal except for potassium 3.3.   EKG atrial fibrillation with rates of about 120-160.   PROBLEMS:  1.  Atrial fibrillation with rapid ventricular response.  2.  Dyspnea.  3.  History of asthma.  4.  Nausea.  5.  Anemia.  6.  Hypertension.  7.  Recent right knee surgery.  PLAN:  We will repeat CT angiogram of the chest to rule out pulmonary  embolus.  Lopressor we will increase back to 50 mg daily.  We will replenish  her potassium.  Heart monitoring has been requested.  We will follow up  other lab tests including rechecking thyroid test, Digoxin level.  I have  also requested a 2D echocardiogram again       AGG/MEDQ  D:  11/09/2003  T:  11/09/2003  Job:  [id number]

## 2010-06-18 NOTE — Discharge Summary (Signed)
[facility]  Patient:    [name], [name]                   MRN: [id number] Adm. Date:  [id number] Disc. Date: [id number] Attending:  Carmelina Peal Dictator:   Feliciana Rossetti, M.D.                           Discharge Summary  DISCHARGE DIAGNOSIS: 1. Presyncope. 2. Hypovolemia. 3. Urinary tract infection. 4. Hypokalemia. 5. Incarcerated diaphragmatic hernia.  HOSPITAL COURSE: The patient was admitted to [facility] following two weeks of worsening nausea and vomiting that became bilious.  It was nonbloody however.  The patient upon questioning had symptoms of nausea and vomiting for greater than 60 days.  Urinalysis revealed urinary tract infection and for this patient was begun on Tequin.  The patient initially responded to this therapy stating she felt better with improved oral intake; however, she began to worsen again and consultation was obtained with the gastroenterologist.  The gastroenterologist reviewed patients diaphragmatic hernia and suspected that it may be incarcerated, and for this surgery consult was obtained from Dr. Angelia Mould. Derrell Lolling and subsequently from Dr. Zigmund Daniel.  Given concern of tachydysrhythmia in patient, consult was obtained from Dr. Luis Abed, M.D., [facility].  Cardiologic workup, including repeat electrocardiogram and two-dimensional echocardiogram revealed a healthy cardiac status.  At this time, it was felt patient was ready to proceed to operating room, and she was prepared and taken to the operating room where surgical correction was performed (see operative note).  Patient had an uneventful postoperative course, ______ beats noted on the monitor.  Patient continued to do well following her procedure and by August 14, 1999, patient was felt ready for discharge.  CONDITION AT DISCHARGE: The patient was well-appearing, ambulating with ease, stating she felt well requiring only rare narcotic  analgesia.  DISCHARGE MEDICATIONS: 1. Ditropan XL 5 mg qd. 2. Darvocet-N 100 p.r.n. pain.  DISCHARGE INSTRUCTIONS: The patient was to follow-up the week following discharge with the surgeons and with 2 weeks following discharge with Dr. Feliciana Rossetti. DD:  09/09/99 TD:  09/10/99 Job: [id number]

## 2010-06-18 NOTE — Consult Note (Signed)
NAMEKHRISTIAN, SEALS NO.:  [id number]   MEDICAL RECORD NO.:  [id number]          PATIENT TYPE:  INP   LOCATION:  3302                         FACILITY:  [facility]   PHYSICIAN:  Velora Heckler, MD      DATE OF BIRTH:  September 08, 1917   DATE OF CONSULTATION:  DATE OF DISCHARGE:                                   CONSULTATION   REFERRING PHYSICIAN:  Dr. Nolon Rod Monguilod and Dr. Melvia Heaps.   REASON FOR CONSULTATION:  Clostridium difficile colitis, acidosis,  leukocytosis.   HISTORY OF PRESENT ILLNESS:  The patient is a pleasant 77 year old white  female admitted on the medical service of Dr. Lenon Curt. Green on November 09, 2003, from [facility] with atrial fibrillation and rapid ventricular  response.  The patient was noted during the ensuing hospital days to develop  abdominal distension and diarrhea.  White count also rose to 15,500 on  October 11.  Clostridium difficile toxin and culture was obtained and tested  positive.  The patient underwent a CT scan of the abdomen which demonstrated  a thick-walled colon consistent with colitis.  There was some free-fluid  present within the peritoneal cavity.  There was no sign of perforation or  free air.  The patient was seen in consultation by gastroenterology.  The  patient was started on Flagyl and subsequently vancomycin was added.  The  patient's white cell count continued to rise to 21,000 and then 26,000 on  the morning of October 14. Also noted was a drop in the patient's serum  bicarbonate level to 16.  The patient, however, remained clinically stable.  She remained in the stepdown unit.  General surgery is asked to consult at  this time for the possibility of total colectomy should the patient's  clinical course deteriorate further.   The patient is awake and oriented.  She denies abdominal pain.  She denies  any previous episodes of Clostridium difficile colitis, although she has had  frequent diarrhea  for as long as she can remember.  Family is at the  bedside.   PAST MEDICAL HISTORY:  History of hypertension, history of hypothyroidism  status post radioactive iodine ablation for toxic goiter, history of  hyperlipidemia, history of osteoarthritis, history of osteoporosis, history  of macular degeneration, history of chronic diarrhea, history of  diverticulosis, history of asthma, history of bronchiectasis, history of  hiatal hernia, status post repair July 2001 by Dr. Lebron Conners, and  status post repair of diaphragmatic and ventral hernia by Dr. Angelia Mould.  Derrell Lolling in March of 2002, history of atrial fibrillation, history of right  renal atrophy, history of breast lumpectomy, history of cataracts, status  post right total knee replacement by Dr. Ollen Gross on October 13, 2003.   MEDICATIONS:  1.  Cardizem.  2.  Lopressor.  3.  Coumadin.  4.  Protonix.  5.  Lasix.  6.  K-Dur.  7.  Digoxin.  8.  Avapro.  9.  Levothroid.  10. Ocuvite.  11. Percocet.  12. Albuterol.  13. Dramamine.  14. Tylenol.   ALLERGIES:  CODEINE  AND ASPIRIN.   SOCIAL HISTORY:  The patient is widowed.  She has lived at [facility] for several years.  She denies tobacco use.  She drinks a rare glass of  wine.  Family is at the bedside.   FAMILY HISTORY:  Notable for breast cancer in the patient's daughter.   REVIEW OF SYSTEMS:  A 15-system review documented in the medical record.  Significant positives related to gastrointestinal tract noted above.  Diarrhea appears to have diminished over the past 12 hours.   PHYSICAL EXAMINATION:  GENERAL:  An 77 year old old, bright white female,  ward 3300.  No acute distress.  VITAL SIGNS:  Temperature 98.8, pulse 85, respirations 18, blood pressure  120/65.  O2 saturations 99% on two liters nasal cannula.  HEENT:  Shows her to be normocephalic.  She is not icteric.  Mucous  membranes are very dry.  Dentition is fair to poor.  Palpation of the  neck  shows no palpable masses.  There is no lymphadenopathy.  Thyroid was normal  without nodularity.  There was no tenderness.  CARDIAC:  Exam shows an irregular rhythm in the 80's.  Peripheral pulses are  full.  Auscultation of the chest shows bilateral breath sounds without  significant rales, rhonchi or wheeze.  ABDOMEN:  Soft.  There are bowel sounds present.  There is a well-healed  upper midline surgical wound.  She is slightly tympanitic over the right mid  abdomen.  There is no tenderness to palpation or percussion. There are no  palpable masses.  There is no hepatosplenomegaly.  There is no guarding.  There is no rebound.  There is no sign of hernia.  EXTREMITIES:  Nontender without edema.  There is a fresh surgical wound on  the right knee which appears to be healing without complication.  NEUROLOGIC:  The patient is alert and oriented.  She responds to questioning  appropriately.  She has no focal neurologic deficit.   LABORATORY DATA:  White count 26.1, hemoglobin 10.7, hematocrit 31.5%,  platelet count 418,000.  Electrolytes show a potassium of 2.9, bicarbonate  16, creatinine 1.1.   Radiographic studies:  CT scan of the abdomen and pelvis from October 13  noted above with thickening of the colonic wall.  KUB film from today shows  no change in mild ileus, and colonic wall thickening.  There is no free air.   IMPRESSION:  Severe Clostridium difficile colitis, rule out early ischemia,  doubt infarction at this point.  No evidence of secondary end-organ failure.   PLAN:  1.  Agree with continuing Flagyl and vancomycin.  2.  Maintain n.p.o.  Intravenous hydration, consider hyperalimentation.  3.  Serial abdominal exams.  4.  Repeat laboratory studies in the a.m. of October 15.  5.  If clinically deteriorates, would proceed with flexible sigmoidoscopy to     assess the colon for ischemia versus infarction.  I have discussed this      with Dr. Melvia Heaps who is in  agreement.  6.  Operation for exploratory laparotomy with subtotal colectomy and end      ileostomy is certainly a last resort, and would only be undertaken in      the event of ischemia, infarction or development of other end-organ      failure.   I have discussed all of the above with the family at the bedside. They  understand.  We will follow her closely.      Tawanna Cooler   TMG/MEDQ  D:  11/14/2003  T:  11/14/2003  Job:  [id number]   cc:   Rosanne Sack, M.D.  [address]  [place], [place] [address]  Fax: [phone]   Lenon Curt. Chilton Si, M.D.  [address]  [place]  [place] [address]  Fax: [phone]   Barbette Hair. Arlyce Dice, M.D. [facility]

## 2010-06-18 NOTE — Assessment & Plan Note (Signed)
[facility]                              CARDIOLOGY OFFICE NOTE   TELSA, DILLAVOU               MRN:          [id number]  DATE:10/20/2005                            DOB:          1917-11-02    REASON FOR VISIT:  Follow up atrial fibrillation and cardiac testing.   HISTORY OF PRESENT ILLNESS:  I saw Ms. Baxley back in August.  Her  history is outlined in my previous note.  She states that she feels much  better with no significant dizziness or weakness.  She has been tolerating  her routine exercise as well.  Her heart rate is much better controlled  today following our increase in Lopressor dosing.  Electrocardiogram shows  atrial fibrillation at 79 beats per minute with nonspecific ST-T wave  changes.  I referred her for an echocardiogram which was done back in  September and the ejection fraction at that time was described as 40 to 50%,  although in the setting of rapid atrial fibrillation.  This raises the  possibility of a tachycardia-mediated cardiomyopathy versus underestimation  of left ventricular function in the setting of rapid rhythm.  She also had  mild to moderate mitral regurgitation.  I discussed this with the patient  and her daughter today.   ALLERGIES:  CODEINE.   PRESENT MEDICATIONS:  1. Synthroid 150 mcg p.o. daily.  2. Lanoxin 125 mcg p.o. daily.  3. Icaps b.i.d.  4. Multivitamin 1 p.o. daily.  5. Coumadin as directed by Dr. Chilton Si.  6. Enablex 7.5 mg p.o. daily.  7. Metoprolol 50 mg p.o. b.i.d.   REVIEW OF SYSTEMS:  As described in history of present illness.  She has had  no bleeding problems.   EXAMINATION:  VITAL SIGNS:  Blood pressure 159/90, heart rate 79, weight 146  pounds.  GENERAL:  Patient is comfortable and in no acute distress.  LUNGS:  Clear without labored breathing.  CARDIAC:  Irregularly irregular rhythm with soft apical systolic murmur.  No  S3 gallop.  EXTREMITIES:  No pitting  edema.   IMPRESSION AND RECOMMENDATION:  1. Chronic atrial fibrillation, better rate controlled, and continuing on      Coumadin under the direction of Dr. Chilton Si.  The patient feels      symptomatically better with more optimal heart rate control and at this      point I will not change her medications.  I would like to repeat a      limited echocardiogram after approximately 8 weeks of better heart rate      control to reassess left ventricular function.  If she continues to      have evidence of cardiomyopathy, we may need to adjust her medical      regimen further.  2. I will plan to see her back over the next 6 months, assuming that we do      not need to make changes in her regimen sooner.  Jonelle Sidle, MD    SGM/MedQ  DD:  10/20/2005  DT:  10/22/2005  Job #:  [id number]   cc:   Lenon Curt. Chilton Si, M.D.

## 2010-06-18 NOTE — Assessment & Plan Note (Signed)
[facility]                              CARDIOLOGY OFFICE NOTE   TESHARA, MOREE               MRN:          [id number]  DATE:09/21/2005                            DOB:          [birth year]    REFERRING PHYSICIAN:  Lenon Curt. Chilton Si, MD   CARDIOLOGY CONSULTATION:   REQUESTING PHYSICIAN:  Dr. Murray Hodgkins.   REASON FOR CONSULTATION:  Recent dizziness and falls with history of atrial  fibrillation.   HISTORY OF PRESENT ILLNESS:  Ms. Markert is a very pleasant 77 year old  woman with an apparent longstanding history of atrial fibrillation managed  with a strategy of rate control and anticoagulation on Coumadin.  Additional  history includes hypertension, hypothyroidism and esophageal stricture and  dysmotility.  She was seen by Dr. Myrtis Ser in our group several years ago, in  2001, essentially with frequent premature atrial complexes.  She presently  resides in [facility] and typically does fairly well.  She has had  some recent episodes of dizziness and falls.  The patient states that one  occurred several weeks ago when she was walking from her kitchen to her  living room and felt dizzy, followed by a fall without frank loss of  consciousness, but difficulty getting up subsequently.  She had no chest  pain or breathlessness with this.  She tells me that staff at her facility  check her heart rate and blood pressure and that both were high.  She had  another episode more recently when she was backing into a chair and  apparently felt dizzy and missed the chair, falling to the floor.  In  reviewing her chart, I see that she was previously on Cardizem CD in  addition to metoprolol and Lanoxin, although this was discontinued sometime  last year.  I am not aware of any problems of significant bradycardia.  She  does exercise on an elliptical machine and states that she tolerates this  fairly well, although she does have dyspnea on  exertion.  Her  electrocardiogram today in the clinic shows atrial fibrillation with rapid  ventricular response at 117 beats per minute and nonspecific ST-T wave  changes.  I do not have an old tracing in hand for comparison.  She was  provided a 24-hour Holter monitor by Dr. Chilton Si, which was just returned  today, and showed atrial fibrillation ranging from 63 beats per minute up to  149 beats per minute.  She had no specific symptoms while wearing this  tracing.  The longest pause was only 2.5 seconds and she also had rare  premature ventricular complexes/couplets with other motion artifact noted.  Orthostatic measurements were obtained today and the patient's heart rate  ranged anywhere from 99 beats per minute up to 153 beats per minute and her  systolic blood pressure increased with standing from 154 up to 178.  Diastolics were in the 90s.  She does not have any frank sense of rapid  palpitations and it is not entirely certain how long her heart rate has been  elevated.  She previously underwent an echocardiogram many years ago,  demonstrating an ejection  fraction of 65% with possibly mild mitral valve  prolapse, but no significant mitral regurgitation.  She has not had any more  recent studies, as best I can tell.   ALLERGIES:  CODEINE.   PRESENT MEDICATIONS:  1. Synthroid 150 mcg p.o. daily.  2. Lanoxin 125 mcg p.o. daily.  3. Lopressor 25 mg p.o. b.i.d.  4. ICAPS b.i.d.  5. Multivitamin daily.  6. Coumadin as directed.  7. Enablex 7.5 mg p.o. daily.   PAST MEDICAL HISTORY:  As outlined above.  In addition, she has a history of  lower gastrointestinal hemorrhage, possibly due to diverticular bleed.  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  She also has a history of significant illness due to Clostridium  difficile colitis.   SOCIAL HISTORY:  The patient is a widow.  She has 2 children.  She resides  in the [facility].   There is no history of tobacco use.  She drinks  alcohol occasionally.  She has 2-3 caffeinated beverages a day.  She does  use a step machine 5-6 times a week.   PRIOR SURGICAL HISTORY:  1. Total knee replacement in 2005.  2. She is status post breast lumpectomy.  3. She had cataract surgery.  4. She has also had repair of a diaphragmatic and ventral hernia in March      2002.   REVIEW OF SYSTEMS:  As described in the history of present illness.  She is  not having any orthopnea or PND.  She denies any significant lower extremity  edema.  She does stage that she has an occasional fullness in her head.  She  has had no obvious bleeding problems.  She is not experiencing any  reproducible exertional chest pain.   FAMILY HISTORY:  Noncontributory at this particular point.   PHYSICAL EXAMINATION:  VITAL SIGNS:  Blood pressure 156/96, heart rate  ranging between 110-120 beats per minute, seated on the examination table.  Weight is 146 pounds.  GENERAL:  The patient is comfortably, denying any active shortness of  breath, chest pain or palpitations.  NECK:  Supple without elevated jugular venous pressure.  There is no  thyromegaly or thyroid tenderness noted.  LUNGS:  Diminished breath sounds at the bases, no rales noted.  CARDIAC:  Exam reveals an irregularly irregular rhythm without loud murmur.  No S3 gallop is noted.  EXTREMITIES:  No frank pitting edema.   IMPRESSION AND RECOMMENDATIONS:  1. Atrial fibrillation, presumably chronic, and managed with the strategy      of anticoagulation and rate control.  The patient's heart rate is      actually fairly rapid at this time and one wonders if her      symptomatology is due to uncontrolled rate rather than bradycardia or      pauses.  Certainly, the possibility of tachycardia/bradycardia syndrome      is to be considered in this elderly woman.  She has tolerated calcium     channel blocker, beta blocker and Lanoxin therapy in the  past and our      first step will be to increase her Lopressor to 50 mg p.o. b.i.d.  I      will also plan a followup echocardiogram to reassess left ventricular      function and exclude tachycardia-mediated cardiomyopathy or other      valvular heart disease as a component of her shortness of breath.  I  will then have her come back over the next month for symptom review.      Clearly, if she manifests symptomatic bradycardia, then she may      actually be a candidate for pacemaker placement.  We briefly touched on      this today.  I would otherwise suggest regular followup of her thyroid      studies to ensure that this is within control and not exacerbating her      cardiac arrhythmia.  2. Further plans to follow.                                Jonelle Sidle, MD    SGM/MedQ  DD:  09/21/2005  DT:  09/22/2005  Job #:  [id number]   cc:   Lenon Curt. Chilton Si, MD

## 2010-06-18 NOTE — Procedures (Signed)
[facility]  Patient:    [name], [name]                   MRN: [id number] Proc. Date: 08/02/99 Adm. Date:  [id number] Attending:  Feliciana Rossetti CC:         Venita Lick. Pleas Koch., M.D. [facility]             Feliciana Rossetti, M.D.                           Procedure Report  PROCEDURE:  Esophagogastroduodenoscopy.  ENDOSCOPIST:  Venita Lick. Pleas Koch., M.D.  REFERRING PHYSICIAN:  Feliciana Rossetti, M.D.  INDICATIONS:  An 77 year old white female hospitalized with severe, intermittent abdominal pain associated with refractory nausea and vomiting.  A CT scan of the abdomen revealed a large diaphragmatic hernia containing stomach, colon and fat.  PHYSICAL EXAMINATION:  CHEST:  Clear to auscultation and percussion.  CARDIAC:  Regular rate and rhythm without murmurs.  NEUROLOGIC:  Alert and oriented x 3.  ANESTHESIA:  Fentanyl 50 mcg IV, Versed 6 mg IV and pharyngeal Cetacaine spray.  MONITORING:  Automated blood pressure monitor, pulse oximeter and cardiac monitor.  Low-flow oxygen was given my nasal cannula throughout the procedure. The procedure was well-tolerated with no immediate complications.  PROCEDURE:  After the nature of the procedure was discussed with the patient, including discussion of its risks, benefits, and alternatives, she consented to proceed.  She was then comfortably sedated in the left lateral decubitus position. The Olympus video endoscope was inserted into the posterior pharynx and esophagus was intubated under direct visualization.  The esophagus had a normal endoscopic appearance.  The Z-line measured at 35 cm from the incisors. Examination of the proximal gastric body revealed a large volume of retained semisolid and liquid bilious material.  This could not be completely suctioned.  There was a point of partial obstruction of the gastric body, some 10-15 cm below the esophagogastric junction corresponding to the location  of the diaphragm or a gastric volvulus.  The gastric folds did appear to be partially twisted at the site of the partial obstruction.  In the distal stomach, the antrum appeared normal.  However, the gastric pylorus was slightly deformed.  It may have also been deformed secondary to a volvulus. The duodenal bulb and descending duodenum were unremarkable.  Retroflexed view of the angularis, lesser curvature, cardia and fundus revealed only retained semisolid bilious material.  I could not thoroughly suction all this material.  The stomach was decompressed and the endoscope was withdrawn from the patient.  IMPRESSION: 1. Gastric volvulus versus intrathoracic stomach. 2. Rule out a deformed gastric pylorus versus gastric volvulus. 3. Retained gastric contents.  RECOMMENDATION: 1. Surgical consultation. 2. Upper GI series with small-bowel follow-through to further define. DD:  08/02/99 TD:  08/02/99 Job: [id number]

## 2010-06-18 NOTE — Discharge Summary (Signed)
NAME:  [name], [name]                  ACCOUNT NO.:  [id number]   MEDICAL RECORD NO.:  [id number]                   PATIENT TYPE:  INP   LOCATION:  0348                                 FACILITY:  [facility]   PHYSICIAN:  Iva Boop, M.D. [facility]           DATE OF BIRTH:  Apr 18, 1917   DATE OF ADMISSION:  09/28/2001  DATE OF DISCHARGE:  10/02/2001                                 DISCHARGE SUMMARY   ADMISSION DIAGNOSES:  1. An 77 year old white female with acute lower gastrointestinal bleed,     suspect diverticular.  2. Hyperthyroidism.  3. Hypertension.  4. Anemia secondary to #1.   DISCHARGE DIAGNOSES:  1. Resolved acute diverticular hemorrhage.  2. Anemia secondary to above.  3. Toxic thyroid goiter for ablation October 02, 2001.  4. Hypertension poorly controlled.   CONSULTATIONS:  None.   HISTORY OF PRESENT ILLNESS:  Briefly, the patient is a pleasant 77 year old  white female patient of Dr. [name], also primary patient of Dr. Feliciana Rossetti and known to Dr. Sabino Gasser for GI.  The patient was recently  diagnosed with hyperthyroidism and is anticipating ablation procedure within  the next few days. She developed diarrhea over the past couple of months,  had been weak and had associated tremor and was found to have elevated T4,  T3 and depressed TSH.  On the evening of admission, she had eaten dinner and  then at 7 p.m. had acute onset of bright red blood per rectum which was  painless. She had no associated nausea and vomiting, no melena. She did not  have any associated chest pain, shortness of breath or presyncope.  She does  take one aspirin per day but has not been on any other anticoagulants or  NSAIDs.  The patient had several episodes of the rectal bleeding and then  presented to the emergency room for evaluation.  She remained  hemodynamically stable and decision was made to admit her to the hospital  with an acute lower GI bleed for stabilization and further  diagnostic work-  up. She was admitted per Dr. Stan Head who was covering on call.   The patient had apparently had prior flexible sigmoidoscopy but was  uncertain about colonoscopy.  She has had a ventral hernia repair.   LABORATORY DATA:  September 28, 2001, wbc 6.6, hemoglobin 12, hematocrit 35.1,  MCV 88.6, platelets 277.  On September 29, 2001, hemoglobin 9.4, hematocrit  28.0.  This quickly drifted to 7.9 and 23.4 later that same day.  Post  transfusion on September 30, 2001, hemoglobin 10.1, hematocrit 28.9 and follow-  up on October 01, 2001, with hemoglobin 9.2, hematocrit 27.3.  Protime  14.3, INR 1.1, PTT 33.  Electrolytes within normal limits.  Glucose 129, BUN  23, creatinine 1.3, albumin 3.  Serial values again obtained glucose 111 on  October 01, 2001.  Urinalysis with 3 to 6 wbc, few bacteria.   EKG showed a sinus rhythm.  HOSPITAL COURSE:  The patient was admitted to the service of Dr. Stan Head with an acute lower GI bleed.  She was placed at bedrest, started on  IV fluids, serial H&H were obtained and she quickly drifted into the 8 range  and was subsequently transfused two units of packed rbc on September 29, 2001.  She was prepped with GoLYTELY on September 29, 2001, as well and then underwent  colonoscopy with Dr. Leone Payor which showed severe sigmoid diverticulosis,  grade I external hemorrhoids. She had no evidence of active bleeding at the  time of the procedure.  No other significant findings and was felt to have  bled from diverticulosis.  The patient remained stable thereafter and did  not require any further transfusions after her initial two units.  We were  able to advance her diet.  She did have some persistent problems with  hypertension while hospitalized and had complained of some headache which  may have been related to the hypertension. She is being followed by Dr.  Quintella Reichert and had apparently had some recent change in her blood pressure  medication.  Blood  pressure max on October 01, 2001, was 219 at one  point/118.  This was rechecked and was 174/76.  She had been off of her  Toprol for one day which was resumed and we also resumed her Cardizem.  On  October 02, 2001, blood pressure was 181/85.  Post Cardizem dosing she was  down to 169/70.   DISPOSITION:  She was felt stable for discharge to home on October 02, 2001, with instructions to follow up with Dr. Virginia Rochester for further GI problems,  to follow up with Dr. [name] as scheduled.  She was actually scheduled for  ablation of her thyroid the day of discharge and was to keep that  appointment.  She was also advised to call Dr.  Quintella Reichert within the next 24  hours should she have continued headache. She has an appointment with him  within the next one week for evaluation of her blood pressure.   DIET:  Regular.   DISCHARGE MEDICATIONS:  1. Toprol XL 50 mg p.o. q.d.  2. Cardizem CD 180 q.d.  3. She was advised to hold her aspirin for at least 10 days.     Mike Gip, P.A.-C. [facility]                Iva Boop, M.D. [facility]    AE/MEDQ  D:  10/10/2001  T:  10/10/2001  Job:  [id number]   cc:   Georgiana Spinner, M.D.  Fax: [phone]   Brooke Bonito, M.D.  [address]  [place]  [place] [address]  Fax: [phone]   Lacretia Leigh. Quintella Reichert, M.D.

## 2010-06-18 NOTE — Op Note (Signed)
[facility]  Patient:    [name], [name]               MRN: [id number] Proc. Date: 04/21/00 Adm. Date:  [id number] Attending:  Brandy Hale CC:         Zigmund Daniel, M.D.  Lacretia Leigh. Quintella Reichert, M.D.   Operative Report  PREOPERATIVE DIAGNOSIS:  Ventral incisional hernia.  POSTOPERATIVE DIAGNOSIS:  Ventral incisional hernia.  OPERATION PERFORMED:  Laparoscopic repair of ventral incisional hernia with dual mesh (22 cm x 18 cm dimension).  SURGEON:  Dr. Claud Kelp.  FIRST ASSISTANT:  Dr. Lebron Conners.  OPERATIVE INDICATIONS:  This is an 77 year old white female who underwent an open diaphragmatic hernia repair by Dr. Lebron Conners in July of 2001. She did very well with that surgery with excellent relief of her heartburn, reflux, an postprandial pain. She has developed a moderately large incisional hernia at the lower end of her upper midline incision centered just above the umbilicus and this is getting larger rapidly and is becoming uncomfortable. On exam, she has a moderately large but completely reducible ventral hernia beginning at the umbilicus and extending superiorly with defect in the fascia of about 6-7 cm. The hernia sac itself is probably 12-15 cm. She is brought to the operating room electively for repair of her ventral hernia.  OPERATIVE FINDINGS:  There were fairly extensive omental adhesions to the rim of the hernia defect and up into the sac as well. The body of the stomach was adherent to the upper midline incision and had to be dissected free to create a space to anchor the mesh. This dissection was uneventful and the rest of the GI tract looked normal.  OPERATIVE TECHNIQUE:  Following the induction of general endotracheal anesthesia, an oral gastric tube and a Foley catheter were inserted. The abdomen was prepped and draped in a sterile fashion. A 1.5 cm incision was made in the left flank. Dissection  was carried down through the fascial layers until we could identify the peritoneum, elevate the peritoneum and enter the abdomen directly under direct vision. A 10 mm Hasson trocar was inserted and secured with a pursestring suture of #0 Vicryl. Pneumoperitoneum was created. A video camera was inserted. There were no adhesions around the trocar site. We surveyed the abdomen with findings as described above. Two 5 mm trocars were placed on the left side, one in the left upper quadrant and one in the left lower quadrant and another 5 mm trocar placed in the right mid abdomen. Using traction and counter traction and the harmonic scalpel, we took down all of the omental adhesions. When we got our dissection superiorly and found that the body of the stomach was adherent to the abdominal wall, we used endoshear scissors to carefully dissect the stomach off of the abdominal wall. That dissection was uneventful. The stomach was carefully examined and we found no injury to the stomach whatsoever.  We then measured the hernia defect and planned the mesh repair. Using spinal needles passed through the skin, we measured the defect. We then drew a nearly circular elliptical incision around this area taking the edge of the mesh 3-4 cm peripheral to the hernia defect in all directions. We measured this and it was 22 cm x 18 cm.  A large sheet of dual mesh was brought to the operative field. It was measured and using the marking pen on the abdominal wall, a template was created on the mesh and the  mesh was then cut into an 18 x 22 cm fat ellipse. The mesh was marked at the 12 oclock, 3 oclock, 6 oclock and 9 oclock positions. Sutures of #0 Novofil were placed through the mesh and then tied down and cut about 7 or 8 inches long. We marked the skin and the mesh to orient the repair. The mesh was then rolled up and inserted into the abdominal cavity. The mesh was spread out and oriented appropriately. Great  care was taken to keep the rough side of the mesh toward the peritoneum and the smooth side of the mesh toward the intestinal viscera.  Using an endoclose device passed through the abdominal wall, we retrieved the Novofil sutures through four peripherally placed stab wounds. After all of these sutures were passed, we were careful to inspect that we had at least a 1 cm bite in the fascia. We then elevated the mesh and tied all of these sutures and cut the redundant suture material off. This spread the mesh out quite nicely with no redundancy.  We used two 3 mm tackers, each had 30 tacks in them to tack the mesh in place. We placed a row of 5 mm tacks around the edge of the mesh about 5-10 mm back from the edge of the mesh to secure the mesh to the under surface of the abdominal wall and to prevent herniation. We then placed an inner circular ring of tacks to further secure the mesh to the edge of the fascial defect. Photographs were taken. We inspected the repair and were quite satisfied with the fixation. We inspected the stomach and intestines. There was on bleeding or injury. Trocars were removed under direct vision. There was no bleeding from any of the trocar sites.  The pneumoperitoneum was released. The fascia in the left flank incision was closed with #0 Vicryl sutures. The rest of the incision was closed with Steri-Strips and a couple of skin staples. Clean bandages were placed and the patient taken to the recovery room in stable condition. Estimated blood loss was about 50 cc, complications none. Sponge, needle and instrument counts were correct. DD:  04/21/00 TD:  04/22/00 Job: [id number]

## 2010-06-18 NOTE — Assessment & Plan Note (Signed)
[facility]                              CARDIOLOGY OFFICE NOTE   [name], [name]               MRN:          [id number]  DATE:12/19/2005                            DOB:          1917-06-04    PRIMARY CARE PHYSICIAN:  Dr. Murray Hodgkins.   REASON FOR VISIT:  Followup heart rate control.   HISTORY OF PRESENT ILLNESS:  Mr. Shepperson was last in the office in  September.  She has chronic atrial fibrillation managed with Coumadin and  metoprolol.  She had better heart rate control in general on her last visit,  although followed up for an echocardiogram and was noted to have increased  heart rates at that time, and the study was aborted in hopes of attaining  better heart rate control prior to imaging.  She came into the office today  and I reviewed her medications.  Her daughter states that the patient's  heart rate was measured between 115 to 133 at the [facility] at around  9:00 in the morning, which is before she takes her a.m. medications.  She  has been taking her metoprolol at around 10:00 in the morning and also  around 8:30 in the evening.  We called the pharmacy to verify that she was  actually taking the short-acting form, and our plan will be to switch her to  a long-acting form to see if this attains better heart rate control by  overlapping the doses in b.i.d. fashion.  She otherwise does not have any  new symptoms.   ALLERGIES:  CODEINE.   PRESENT MEDICATIONS:  1. Synthroid 150 mcg p.o. daily.  2. Lanoxin 125 mcg p.o. daily.  3. I-Caps b.i.d.  4. Multivitamin 1 p.o. daily.  5. Coumadin as directed.  6. Enablex 7.5 mg p.o. daily.  7. Metoprolol 50 mg p.o. b.i.d.   REVIEW OF SYSTEMS:  As described in the history of present illness.   PHYSICAL EXAMINATION:  Blood pressure today is 154/72, heart rate is 80 to  95 and irregular.  Weight is 145 pounds.  The patient is comfortable in no acute distress.  NECK:  No  elevated jugular venous pressure.  LUNGS:  Clear without labored breathing.  CARDIAC:  Irregularly irregular rhythm.   IMPRESSION/RECOMMENDATIONS:  1. Chronic atrial fibrillation.  Will plan to switch her to Toprol XL 50      mg p.o. b.i.d. (total of 100 mg daily) to see if this helps attain      better heart rate control.  She will let us know about heart rate      checks at the [facility] in the meanwhile.  She will continue      Coumadin otherwise.  We will plan a followup echocardiogram down the      road when her heart rate has      stabilized further.  Otherwise, she will keep the 6 month visit made at      our last followup.  2. Further plans to follow.     Jonelle Sidle, MD  Electronically Signed    SGM/MedQ  DD: 12/19/2005  DT:  12/19/2005  Job #: [id number]   cc:   Lenon Curt Chilton Si, M.D.

## 2010-07-07 ENCOUNTER — Encounter: Payer: Self-pay | Admitting: Internal Medicine

## 2010-08-09 ENCOUNTER — Encounter: Payer: Self-pay | Admitting: *Deleted

## 2010-08-10 ENCOUNTER — Ambulatory Visit (INDEPENDENT_AMBULATORY_CARE_PROVIDER_SITE_OTHER): Payer: Medicare Other | Admitting: Internal Medicine

## 2010-08-10 ENCOUNTER — Encounter: Payer: Self-pay | Admitting: Internal Medicine

## 2010-08-10 DIAGNOSIS — I4891 Unspecified atrial fibrillation: Secondary | ICD-10-CM

## 2010-08-10 DIAGNOSIS — Z950 Presence of cardiac pacemaker: Secondary | ICD-10-CM

## 2010-08-10 DIAGNOSIS — I498 Other specified cardiac arrhythmias: Secondary | ICD-10-CM

## 2010-08-10 DIAGNOSIS — I495 Sick sinus syndrome: Secondary | ICD-10-CM

## 2010-08-10 DIAGNOSIS — I10 Essential (primary) hypertension: Secondary | ICD-10-CM

## 2010-08-10 LAB — PACEMAKER DEVICE OBSERVATION
BATTERY VOLTAGE: 2.993 V
BRDY-0004RV: 110 {beats}/min
RV LEAD IMPEDENCE PM: 725 Ohm

## 2010-08-10 NOTE — Progress Notes (Signed)
  HPI  [name] is a 77 y.o. female seen in followup for atrial fibrillation and bradycardia. She is status post pacemaker implantation.  Since her implant she has had no more lightheadedness or syncope. The device pocket is well healed.  The patient denies SOB, chest pain, edema or palpitations   Past Medical History  Diagnosis Date  . Hypertension   . Hypothyroidism   . Osteoarthritis   . Osteoporosis   . Diverticulosis   . Chronic diarrhea   . Macular degeneration   . Kyphosis     of thoracic spine  . History of GI diverticular bleed Sept 3, 2003  . Asthma   . Toxic goiter     hx w subsequent ablation and hypothyroid state  . Mild cognitive disorder   . Bronchiectasis     left lower lobe noted on x-ray 2/05  . Hiatal hernia     hx w repair  . Urinary incontinence   . Urinary tract bacterial infections   . Atrial fibrillation Sept 12, 2005    new onset  . Right renal atrophy 2005    Past Surgical History  Procedure Date  . Hiatal hernia repair     Current Outpatient Prescriptions  Medication Sig Dispense Refill  . acetaminophen (TYLENOL) 500 MG tablet Take 500 mg by mouth every 6 (six) hours as needed.        . digoxin (LANOXIN) 0.125 MG tablet Take 125 mcg by mouth daily.        Marland Kitchen donepezil (ARICEPT) 10 MG tablet Take 10 mg by mouth at bedtime as needed.        . enalapril (VASOTEC) 20 MG tablet Take 20 mg by mouth 2 (two) times daily.        Marland Kitchen levothyroxine (SYNTHROID, LEVOTHROID) 125 MCG tablet Take 125 mcg by mouth daily.        . metoprolol (LOPRESSOR) 50 MG tablet Take 50 mg by mouth 2 (two) times daily.       . Multiple Vitamins-Minerals (PRESERVISION/LUTEIN) CAPS Take by mouth 2 (two) times daily.        . Probiotic Product (ALIGN) 4 MG CAPS Take by mouth.        . warfarin (COUMADIN) 2 MG tablet Take 2 mg by mouth as directed.        Marland Kitchen DISCONTD: darifenacin (ENABLEX) 7.5 MG 24 hr tablet Take 7.5 mg by mouth daily.        Marland Kitchen DISCONTD:  levothyroxine (LEVOXYL) 150 MCG tablet Take 150 mcg by mouth daily.        Marland Kitchen DISCONTD: Loperamide HCl (IMODIUM A-D) 2 MG CHEW Chew by mouth as needed.        Marland Kitchen DISCONTD: methenamine (HIPREX) 1 G tablet Take 1 g by mouth. daily       . DISCONTD: Multiple Vitamins-Minerals (CENTRUM SILVER ULTRA MENS) TABS Take by mouth daily.          Allergies  Allergen Reactions  . Aspirin     Review of Systems negative except from HPI and PMH  Physical Exam Well developed and well nourished in no acute distress Neck Supple JVP flat; carotids brisk and full Device pocket well healed Clear to ausculation Regular rate and rhythm, no murmurs gallops or rub Soft with active bowel sounds No clubbing cyanosis and edema Alert and oriented, grossly normal motor and sensory function Skin Warm and Dry   Assessment and  Plan

## 2010-08-10 NOTE — Assessment & Plan Note (Signed)
The patient's device was interrogated.  The information was reviewed. No changes were made in the programming.    

## 2010-08-10 NOTE — Assessment & Plan Note (Signed)
50% ventricular pacing

## 2010-08-10 NOTE — Assessment & Plan Note (Signed)
Permanent well rate controlled and on coumadin which seh is tolerating well

## 2010-08-10 NOTE — Patient Instructions (Signed)
Your physician wants you to follow-up in: 1 year  You will receive a reminder letter in the mail two months in advance. If you don't receive a letter, please call our office to schedule the follow-up appointment.  Your physician recommends that you continue on your current medications as directed. Please refer to the Current Medication list given to you today.  

## 2010-08-10 NOTE — Assessment & Plan Note (Signed)
intermmettint -follwoed by PCP

## 2010-10-26 ENCOUNTER — Other Ambulatory Visit: Payer: Self-pay | Admitting: Internal Medicine

## 2010-10-26 DIAGNOSIS — R32 Unspecified urinary incontinence: Secondary | ICD-10-CM

## 2010-10-28 ENCOUNTER — Other Ambulatory Visit: Payer: Medicare Other

## 2010-10-29 ENCOUNTER — Ambulatory Visit
Admission: RE | Admit: 2010-10-29 | Discharge: 2010-10-29 | Disposition: A | Discharge status: Home / Self Care | Payer: No Typology Code available for payment source | Source: Ambulatory Visit | Attending: Internal Medicine | Admitting: Internal Medicine

## 2010-10-29 ENCOUNTER — Ambulatory Visit
Admission: RE | Admit: 2010-10-29 | Discharge: 2010-10-29 | Disposition: A | Discharge status: Home / Self Care | Payer: Medicare Other | Source: Ambulatory Visit | Attending: Internal Medicine | Admitting: Internal Medicine

## 2010-10-29 DIAGNOSIS — R32 Unspecified urinary incontinence: Secondary | ICD-10-CM

## 2010-11-11 ENCOUNTER — Other Ambulatory Visit: Payer: Self-pay | Admitting: Internal Medicine

## 2010-11-11 ENCOUNTER — Encounter: Payer: Self-pay | Admitting: Internal Medicine

## 2010-11-11 ENCOUNTER — Ambulatory Visit (INDEPENDENT_AMBULATORY_CARE_PROVIDER_SITE_OTHER): Payer: Medicare Other | Admitting: *Deleted

## 2010-11-11 DIAGNOSIS — I4891 Unspecified atrial fibrillation: Secondary | ICD-10-CM

## 2010-11-11 DIAGNOSIS — I498 Other specified cardiac arrhythmias: Secondary | ICD-10-CM

## 2010-11-11 DIAGNOSIS — Z950 Presence of cardiac pacemaker: Secondary | ICD-10-CM

## 2010-11-11 LAB — REMOTE PACEMAKER DEVICE
BATTERY VOLTAGE: 2.98 V
RV LEAD AMPLITUDE: 9.6 mv
VENTRICULAR PACING PM: 63

## 2010-11-11 LAB — CBC
HCT: 45
Hemoglobin: 15.4 — ABNORMAL HIGH
Platelets: 207
WBC: 8

## 2010-11-15 NOTE — Progress Notes (Signed)
Pacer remote check  

## 2010-12-07 ENCOUNTER — Encounter: Payer: Self-pay | Admitting: *Deleted

## 2011-02-10 ENCOUNTER — Ambulatory Visit (INDEPENDENT_AMBULATORY_CARE_PROVIDER_SITE_OTHER): Payer: Medicare Other | Admitting: *Deleted

## 2011-02-10 DIAGNOSIS — I495 Sick sinus syndrome: Secondary | ICD-10-CM

## 2011-02-10 DIAGNOSIS — I498 Other specified cardiac arrhythmias: Secondary | ICD-10-CM

## 2011-02-11 ENCOUNTER — Encounter: Payer: Self-pay | Admitting: Internal Medicine

## 2011-02-11 ENCOUNTER — Other Ambulatory Visit: Payer: Self-pay | Admitting: Internal Medicine

## 2011-02-11 LAB — REMOTE PACEMAKER DEVICE
BMOD-0002RV: 12
BRDY-0002RV: 60 {beats}/min
DEVICE MODEL PM: 7124598
VENTRICULAR PACING PM: 67

## 2011-02-17 NOTE — Progress Notes (Signed)
Remote icd check  

## 2011-02-22 ENCOUNTER — Encounter: Payer: Self-pay | Admitting: *Deleted

## 2011-05-12 ENCOUNTER — Encounter: Payer: Self-pay | Admitting: Internal Medicine

## 2011-05-12 ENCOUNTER — Ambulatory Visit (INDEPENDENT_AMBULATORY_CARE_PROVIDER_SITE_OTHER): Payer: Medicare Other | Admitting: *Deleted

## 2011-05-12 DIAGNOSIS — I498 Other specified cardiac arrhythmias: Secondary | ICD-10-CM

## 2011-05-13 LAB — REMOTE PACEMAKER DEVICE
RV LEAD AMPLITUDE: 10.6 mv
RV LEAD IMPEDENCE PM: 700 Ohm
VENTRICULAR PACING PM: 74

## 2011-05-19 NOTE — Progress Notes (Signed)
Remote pacer check  

## 2011-05-31 ENCOUNTER — Encounter: Payer: Self-pay | Admitting: *Deleted

## 2011-08-08 LAB — PROTIME-INR

## 2011-09-08 LAB — PROTIME-INR

## 2011-09-28 ENCOUNTER — Encounter (INDEPENDENT_AMBULATORY_CARE_PROVIDER_SITE_OTHER): Payer: Medicare Other

## 2011-09-28 ENCOUNTER — Encounter: Payer: Self-pay | Admitting: Cardiology

## 2011-09-28 ENCOUNTER — Ambulatory Visit (INDEPENDENT_AMBULATORY_CARE_PROVIDER_SITE_OTHER): Payer: Medicare Other | Admitting: Cardiology

## 2011-09-28 VITALS — BP 169/77 | HR 59 | Ht 66.0 in | Wt 128.6 lb

## 2011-09-28 DIAGNOSIS — Z950 Presence of cardiac pacemaker: Secondary | ICD-10-CM

## 2011-09-28 DIAGNOSIS — I498 Other specified cardiac arrhythmias: Secondary | ICD-10-CM

## 2011-09-28 DIAGNOSIS — R001 Bradycardia, unspecified: Secondary | ICD-10-CM

## 2011-09-28 DIAGNOSIS — I4891 Unspecified atrial fibrillation: Secondary | ICD-10-CM

## 2011-09-28 DIAGNOSIS — M7989 Other specified soft tissue disorders: Secondary | ICD-10-CM

## 2011-09-28 LAB — PACEMAKER DEVICE OBSERVATION
BRDY-0002RV: 60 {beats}/min
BRDY-0004RV: 100 {beats}/min
DEVICE MODEL PM: 7124598
RV LEAD AMPLITUDE: 8.7 mv
RV LEAD IMPEDENCE PM: 690 Ohm

## 2011-09-28 NOTE — Progress Notes (Signed)
ELECTROPHYSIOLOGY OFFICE NOTE  Patient ID: [name] MRN: [id number], DOB/AGE: Jan 23, 1918   Date of Visit: 09/28/2011  Primary Physician: Murray Hodgkins, MD Primary Cardiologist: Berton Mount, MD Reason for Visit: Device follow-up  History of Present Illness [name] is a pleasant 78 year old woman with permanent AFib and bradycardia s/p single chamber PPM implantation who presents today for routine device follow-up. Following her last remote check in April, it was noted her histograms were left-shifted/blunted. She was V pacing 74% of the time. It was recommended rate response be turned on at her next clinic visit. She is accompanied by her daughter who assists with history questions. [name] reports she is able to perform her ADLs and physical therapy without much difficulty; however, she has noticed she tires easily when walking with a walker to the main dining room at the facility. She denies CP or SOB. She denies palpitations. She denies dizziness, near syncope or syncope. She and her daughter report increased LLE swelling. She denies LE pain or redness. She denies difficulty with walking. [name] and her daughter report intermittent swelling in both legs over the years and in January of this year a RLE venous Doppler study ruled out DVT. They are now concerned about the swelling on the left. She is chronically anticoagulated with warfarin. They also report she has experienced swelling like this before related to severe arthritis involving the left knee. She has not seen her orthopedic specialist recently.  Past Medical History  Diagnosis Date  . Hypertension   . Hypothyroidism   . Osteoarthritis   . Osteoporosis   . Diverticulosis   . Chronic diarrhea   . Macular degeneration   . Kyphosis     of thoracic spine  . History of GI diverticular bleed Sept 3, 2003  . Asthma   . Toxic goiter     hx w subsequent ablation and hypothyroid state  . Mild cognitive  disorder   . Bronchiectasis     left lower lobe noted on x-ray 2/05  . Hiatal hernia     hx w repair  . Urinary incontinence   . Urinary tract bacterial infections   . Atrial fibrillation Sept 12, 2005    new onset  . Right renal atrophy 2005    Past Surgical History  Procedure Date  . Hiatal hernia repair      Allergies/Intolerances Allergies  Allergen Reactions  . Asacol (Mesalamine)   . Aspirin   . Butalbital-Asa-Caff-Codeine     Current Home Medications Current Outpatient Prescriptions  Medication Sig Dispense Refill  . acetaminophen (TYLENOL) 500 MG tablet Take 500 mg by mouth at bedtime.       . cloNIDine (CATAPRES) 0.1 MG tablet Take 0.1 mg by mouth daily.      . digoxin (LANOXIN) 0.125 MG tablet Take 125 mcg by mouth daily.        Marland Kitchen donepezil (ARICEPT) 10 MG tablet Take 10 mg by mouth at bedtime as needed.        . enalapril (VASOTEC) 20 MG tablet Take 20 mg by mouth 2 (two) times daily.        Marland Kitchen escitalopram (LEXAPRO) 5 MG tablet Take 5 mg by mouth daily.      Marland Kitchen levothyroxine (SYNTHROID, LEVOTHROID) 100 MCG tablet Take 100 mcg by mouth daily.      . metoprolol (LOPRESSOR) 50 MG tablet Take 100 mg by mouth 2 (two) times daily.       . Multiple  Vitamins-Minerals (PRESERVISION/LUTEIN) CAPS Take by mouth 2 (two) times daily.        . Probiotic Product (ALIGN) 4 MG CAPS Take 4 mg by mouth daily.       Marland Kitchen warfarin (COUMADIN) 2 MG tablet Take 2 mg by mouth as directed.          Social History Social History  . Marital Status: Widowed   Social History Main Topics  . Smoking status: Never Smoker   . Smokeless tobacco: Never Used  . Alcohol Use: No  . Drug Use: No   Social History Narrative  . Lives at [facility]    Review of Systems General: No chills, fever, night sweats or weight changes Cardiovascular: No chest pain, dyspnea on exertion, orthopnea, palpitations, paroxysmal nocturnal dyspnea Dermatological: No rash, lesions or  masses Respiratory: No cough, dyspnea Urologic: No hematuria, dysuria Abdominal: No nausea, vomiting, diarrhea, bright red blood per rectum, melena, or hematemesis Neurologic: No visual changes, weakness, changes in mental status All other systems reviewed and are otherwise negative except as noted above.  Physical Exam Blood pressure 169/77, pulse 59, height 5\' 6"  (1.676 m), weight 128 lb 9.6 oz (58.333 kg).  General: Well developed, well appearing, elderly 78 year old female in no acute distress. HEENT: Normocephalic, atraumatic. EOMs intact. Sclera nonicteric. Oropharynx clear.  Neck: Supple. No JVD. Lungs: Respirations regular and unlabored, CTA bilaterally. No wheezes, rales or rhonchi. Heart: S1, S2 present. No murmurs, rub, S3 or S4. Abdomen: Soft, non-distended. Extremities: No clubbing or cyanosis. 1+ edema LLE. No erythema or tenderness. Multiple small ecchymoses on LE bilaterally.   Psych: Normal affect. Neuro: Alert and oriented X 3. Moves all extremities spontaneously.   Recent labs (copy sent with patient from [facility] 08/18/2011 CBC - WBC 6000, Hgb 13, Hct 40, PLT 171,000 CMP - sodium 144, potassium 4.0, chloride  108, bicarb 25, BUN 23, creatinine 1.04, ALT 8, AST 14, alk phos 80 TSH - 0.253 Digoxin level - 0.7 INR 2.57  Diagnostics Device interrogation shows normal VVI PPM with good battery status and stable lead parameters/measurements; histograms were left-shifted/blunted therefore rate response was turned on and her sensor rate lowered to 100 bpm; no episodes; no other programming changes made; see PaceArt report  Assessment and Plan 1. Bradycardia s/p PPM - normal device function; turned rate response on as outlined above, mode now VVIR; no other programming changes made; continue remote device checks every 3 months; follow-up with Dr. Graciela Husbands in one year unless needed sooner 2. Atrial fibrillation - continue rate control with metoprolol and warfarin for  embolic prophylaxis 3. LLE swelling - [name] and her daughter are concerned about her unilateral edema; doubt DVT as she is chronically anticoagulated with warfarin, but will order LLE venous Doppler US; recommended she wear compression stockings daily as tolerates; follow-up with orthopedic specialist as scheduled  Ms. Hochstein and her daughter expressed verbal understanding and agree with this plan of care. Signed, Rick Duff, PA-C 09/28/2011, 1:51 PM

## 2011-09-28 NOTE — Patient Instructions (Addendum)
Remote monitoring is used to monitor your Pacemaker of ICD from home. This monitoring reduces the number of office visits required to check your device to one time per year. It allows [place] to keep an eye on the functioning of your device to ensure it is working properly. You are scheduled for a device check from home on January 02, 2012. You may send your transmission at any time that day. If you have a wireless device, the transmission will be sent automatically. After your physician reviews your transmission, you will receive a postcard with your next transmission date.  Your physician wants you to follow-up in: 1 year with Dr Graciela Husbands.  You will receive a reminder letter in the mail two months in advance. If you don't receive a letter, please call our office to schedule the follow-up appointment.

## 2011-10-06 ENCOUNTER — Encounter: Payer: Self-pay | Admitting: Cardiology

## 2012-01-02 ENCOUNTER — Ambulatory Visit (INDEPENDENT_AMBULATORY_CARE_PROVIDER_SITE_OTHER): Payer: Medicare Other | Admitting: *Deleted

## 2012-01-02 ENCOUNTER — Encounter: Payer: Self-pay | Admitting: Internal Medicine

## 2012-01-02 DIAGNOSIS — I498 Other specified cardiac arrhythmias: Secondary | ICD-10-CM

## 2012-01-02 DIAGNOSIS — Z950 Presence of cardiac pacemaker: Secondary | ICD-10-CM

## 2012-01-02 LAB — REMOTE PACEMAKER DEVICE
RV LEAD AMPLITUDE: 10.6 mv
RV LEAD THRESHOLD: 1.25 V

## 2012-01-09 ENCOUNTER — Encounter: Payer: Self-pay | Admitting: *Deleted

## 2012-02-06 ENCOUNTER — Inpatient Hospital Stay (HOSPITAL_COMMUNITY)
Admission: EM | Admit: 2012-02-06 | Discharge: 2012-02-09 | DRG: 378 | Disposition: A | Discharge status: Nursing Home (Includes ICF) | Payer: Medicare Other | Attending: Internal Medicine | Admitting: Internal Medicine

## 2012-02-06 ENCOUNTER — Encounter (HOSPITAL_COMMUNITY): Payer: Self-pay | Admitting: Emergency Medicine

## 2012-02-06 DIAGNOSIS — G3184 Mild cognitive impairment, so stated: Secondary | ICD-10-CM | POA: Diagnosis present

## 2012-02-06 DIAGNOSIS — K625 Hemorrhage of anus and rectum: Secondary | ICD-10-CM | POA: Diagnosis present

## 2012-02-06 DIAGNOSIS — E039 Hypothyroidism, unspecified: Secondary | ICD-10-CM | POA: Diagnosis present

## 2012-02-06 DIAGNOSIS — K922 Gastrointestinal hemorrhage, unspecified: Secondary | ICD-10-CM

## 2012-02-06 DIAGNOSIS — Z79899 Other long term (current) drug therapy: Secondary | ICD-10-CM

## 2012-02-06 DIAGNOSIS — R55 Syncope and collapse: Secondary | ICD-10-CM

## 2012-02-06 DIAGNOSIS — I10 Essential (primary) hypertension: Secondary | ICD-10-CM | POA: Diagnosis present

## 2012-02-06 DIAGNOSIS — K5730 Diverticulosis of large intestine without perforation or abscess without bleeding: Secondary | ICD-10-CM

## 2012-02-06 DIAGNOSIS — I498 Other specified cardiac arrhythmias: Secondary | ICD-10-CM

## 2012-02-06 DIAGNOSIS — Z96659 Presence of unspecified artificial knee joint: Secondary | ICD-10-CM

## 2012-02-06 DIAGNOSIS — D62 Acute posthemorrhagic anemia: Secondary | ICD-10-CM | POA: Diagnosis present

## 2012-02-06 DIAGNOSIS — Z66 Do not resuscitate: Secondary | ICD-10-CM | POA: Diagnosis present

## 2012-02-06 DIAGNOSIS — K5731 Diverticulosis of large intestine without perforation or abscess with bleeding: Principal | ICD-10-CM | POA: Diagnosis present

## 2012-02-06 DIAGNOSIS — Z8719 Personal history of other diseases of the digestive system: Secondary | ICD-10-CM

## 2012-02-06 DIAGNOSIS — Z950 Presence of cardiac pacemaker: Secondary | ICD-10-CM

## 2012-02-06 DIAGNOSIS — R609 Edema, unspecified: Secondary | ICD-10-CM

## 2012-02-06 DIAGNOSIS — F0390 Unspecified dementia without behavioral disturbance: Secondary | ICD-10-CM | POA: Diagnosis present

## 2012-02-06 DIAGNOSIS — I4891 Unspecified atrial fibrillation: Secondary | ICD-10-CM

## 2012-02-06 DIAGNOSIS — Z7901 Long term (current) use of anticoagulants: Secondary | ICD-10-CM

## 2012-02-06 HISTORY — DX: Personal history of other infectious and parasitic diseases: Z86.19

## 2012-02-06 HISTORY — DX: Presence of cardiac pacemaker: Z95.0

## 2012-02-06 LAB — CBC WITH DIFFERENTIAL/PLATELET
HCT: 35.7 % — ABNORMAL LOW (ref 36.0–46.0)
Hemoglobin: 11.5 g/dL — ABNORMAL LOW (ref 12.0–15.0)
Lymphocytes Relative: 22 % (ref 12–46)
Monocytes Absolute: 0.5 10*3/uL (ref 0.1–1.0)
Monocytes Relative: 9 % (ref 3–12)
Neutro Abs: 3.5 10*3/uL (ref 1.7–7.7)
Neutrophils Relative %: 61 % (ref 43–77)
RBC: 3.9 MIL/uL (ref 3.87–5.11)
WBC: 5.7 10*3/uL (ref 4.0–10.5)

## 2012-02-06 LAB — PROTIME-INR: Prothrombin Time: 27.4 seconds — ABNORMAL HIGH (ref 11.6–15.2)

## 2012-02-06 LAB — BASIC METABOLIC PANEL
BUN: 19 mg/dL (ref 6–23)
CO2: 27 mEq/L (ref 19–32)
Chloride: 104 mEq/L (ref 96–112)
Creatinine, Ser: 0.98 mg/dL (ref 0.50–1.10)
GFR calc Af Amer: 56 mL/min — ABNORMAL LOW (ref >90)
Potassium: 4.4 mEq/L (ref 3.5–5.1)

## 2012-02-06 LAB — HEMOGLOBIN AND HEMATOCRIT, BLOOD
HCT: 26.7 % — ABNORMAL LOW (ref 36.0–46.0)
Hemoglobin: 8.6 g/dL — ABNORMAL LOW (ref 12.0–15.0)

## 2012-02-06 LAB — TYPE AND SCREEN: ABO/RH(D): O POS

## 2012-02-06 LAB — OCCULT BLOOD, POC DEVICE: Fecal Occult Bld: POSITIVE — AB

## 2012-02-06 MED ORDER — METOPROLOL SUCCINATE ER 100 MG PO TB24
100.0000 mg | ORAL_TABLET | Freq: Two times a day (BID) | ORAL | Status: DC
  Administered 2012-02-06 – 2012-02-09 (×6): 100 mg via ORAL
  Filled 2012-02-06 (×7): qty 1

## 2012-02-06 MED ORDER — ACETAMINOPHEN 650 MG RE SUPP: 650.0000 mg | Freq: Four times a day (QID) | RECTAL | Status: DC | PRN

## 2012-02-06 MED ORDER — ACETAMINOPHEN 325 MG PO TABS: 650.0000 mg | ORAL_TABLET | Freq: Four times a day (QID) | ORAL | Status: DC | PRN

## 2012-02-06 MED ORDER — ONDANSETRON HCL 4 MG/2ML IJ SOLN: 4.0000 mg | Freq: Four times a day (QID) | INTRAMUSCULAR | Status: DC | PRN

## 2012-02-06 MED ORDER — POLYETHYLENE GLYCOL 3350 17 G PO PACK
17.0000 g | PACK | Freq: Every day | ORAL | Status: DC | PRN
  Filled 2012-02-06: qty 1

## 2012-02-06 MED ORDER — DONEPEZIL HCL 10 MG PO TABS
10.0000 mg | ORAL_TABLET | Freq: Every day | ORAL | Status: DC
  Administered 2012-02-06 – 2012-02-08 (×3): 10 mg via ORAL
  Filled 2012-02-06 (×4): qty 1

## 2012-02-06 MED ORDER — SODIUM CHLORIDE 0.9 % IV SOLN
INTRAVENOUS | Status: DC
  Administered 2012-02-06 – 2012-02-07 (×2): via INTRAVENOUS
  Administered 2012-02-08: 20 mL via INTRAVENOUS

## 2012-02-06 MED ORDER — PRESERVISION/LUTEIN PO CAPS: 1.0000 | ORAL_CAPSULE | Freq: Two times a day (BID) | ORAL | Status: DC

## 2012-02-06 MED ORDER — DIGOXIN 125 MCG PO TABS
125.0000 ug | ORAL_TABLET | Freq: Every day | ORAL | Status: DC
  Administered 2012-02-06 – 2012-02-09 (×4): 125 ug via ORAL
  Filled 2012-02-06 (×4): qty 1

## 2012-02-06 MED ORDER — ENALAPRIL MALEATE 20 MG PO TABS
20.0000 mg | ORAL_TABLET | Freq: Two times a day (BID) | ORAL | Status: DC
  Administered 2012-02-06 – 2012-02-09 (×6): 20 mg via ORAL
  Filled 2012-02-06 (×8): qty 1

## 2012-02-06 MED ORDER — CLONIDINE HCL 0.2 MG PO TABS
0.2000 mg | ORAL_TABLET | Freq: Every day | ORAL | Status: DC
  Administered 2012-02-06 – 2012-02-09 (×4): 0.2 mg via ORAL
  Filled 2012-02-06 (×4): qty 1

## 2012-02-06 MED ORDER — ACETAMINOPHEN 500 MG PO TABS
500.0000 mg | ORAL_TABLET | Freq: Two times a day (BID) | ORAL | Status: DC
  Administered 2012-02-06: 500 mg via ORAL
  Filled 2012-02-06: qty 1

## 2012-02-06 MED ORDER — OCUVITE-LUTEIN PO CAPS
1.0000 | ORAL_CAPSULE | Freq: Every day | ORAL | Status: DC
  Administered 2012-02-06 – 2012-02-08 (×3): 1 via ORAL
  Filled 2012-02-06 (×4): qty 1

## 2012-02-06 MED ORDER — ACETAMINOPHEN 500 MG PO TABS
500.0000 mg | ORAL_TABLET | Freq: Two times a day (BID) | ORAL | Status: DC
  Administered 2012-02-07 – 2012-02-08 (×3): 500 mg via ORAL
  Filled 2012-02-06 (×6): qty 1

## 2012-02-06 MED ORDER — RISAQUAD PO CAPS
1.0000 | ORAL_CAPSULE | Freq: Every day | ORAL | Status: DC
  Administered 2012-02-06 – 2012-02-09 (×4): 1 via ORAL
  Filled 2012-02-06 (×4): qty 1

## 2012-02-06 MED ORDER — ONDANSETRON HCL 4 MG PO TABS: 4.0000 mg | ORAL_TABLET | Freq: Four times a day (QID) | ORAL | Status: DC | PRN

## 2012-02-06 MED ORDER — LEVOTHYROXINE SODIUM 125 MCG PO TABS
125.0000 ug | ORAL_TABLET | Freq: Every day | ORAL | Status: DC
  Administered 2012-02-07 – 2012-02-09 (×3): 125 ug via ORAL
  Filled 2012-02-06 (×4): qty 1

## 2012-02-06 MED ORDER — VITAMIN K1 1 MG/0.5ML IJ SOLN
1.0000 mg | Freq: Once | INTRAMUSCULAR | Status: AC
  Administered 2012-02-06: 1 mg via SUBCUTANEOUS
  Filled 2012-02-06: qty 0.5

## 2012-02-06 MED ORDER — ALIGN 4 MG PO CAPS: 4.0000 mg | ORAL_CAPSULE | Freq: Every day | ORAL | Status: DC

## 2012-02-06 NOTE — H&P (Signed)
[facility] History and Physical  [name] OZH:[id number] DOB: 01/30/1918 DOA: 02/06/2012  Referring physician: Dr. Cathren Laine PCP: [name], MD   Chief Complaint: Rectal bleeding   History of Present Illness: [name] is an 79 y.o. female with a PMH of diverticular bleeding of approximately 11 years ago, atrial fibrillation on chronic coumadin, who was brought to the hospital after her caregivers found bright red blood on her diaper this morning.  Since she was admitted, she has had 1 large bloody stool.  The patient reports some abdominal cramping for the past 2 weeks.  No associated nausea or vomiting.    Review of Systems: Constitutional: No fever, no chills;  Appetite normal; + weight loss, no weight gain.  HEENT: No blurry vision, no diplopia, no pharyngitis, no dysphagia CV: No chest pain, no palpitations.  Resp: No SOB, + chronic cough. GI: No nausea, no vomiting, no diarrhea, no melena, + hematochezia.  GU: + dysuria, no hematuria.  MSK: no myalgias, no arthralgias.  Neuro:  No headache, no focal neurological deficits, no history of seizures.  Psych: No depression, no anxiety.  Endo: No thyroid disease, no DM, no heat intolerance, no cold intolerance, no polyuria, no polydipsia  Skin: No rashes, no skin lesions.  Heme: No easy bruising, no history of blood diseases.  Past Medical History Past Medical History  Diagnosis Date  . Hypertension   . Hypothyroidism   . Osteoarthritis   . Osteoporosis   . Diverticulosis   . Chronic diarrhea   . Macular degeneration   . Kyphosis     of thoracic spine  . History of GI diverticular bleed Sept 3, 2003  . Toxic goiter     hx w subsequent ablation and hypothyroid state  . Mild cognitive disorder   . Bronchiectasis     left lower lobe noted on x-ray 2/05  . Hiatal hernia     hx w repair  . Urinary incontinence   . Urinary tract bacterial infections   . Atrial fibrillation Sept 12, 2005    new onset    . Right renal atrophy 2005  . Pacemaker   . H/O Clostridium difficile infection   . Macular degeneration   . Diverticulosis      Past Surgical History Past Surgical History  Procedure Date  . Repair of hiatal hernia and tube, gastrostomy. 08/06/1999  . Total knee arthroplasty 10/13/2003    Right  . Laparoscopic repair of ventral incisional hernia with 04/21/2000     Social History: History   Social History  . Marital Status: Widowed    Spouse Name: N/A    Number of Children: 2  . Years of Education: N/A   Occupational History  . Retired [employer] operational officer    Social History Main Topics  . Smoking status: Never Smoker   . Smokeless tobacco: Never Used  . Alcohol Use: No     Comment: Occasional wine.  . Drug Use: No  . Sexually Active: No   Other Topics Concern  . Not on file   Social History Narrative   Widowed. Lives at [facility], Health Care Unit, skilled nursing care.  Minimal ambulation with walker.  Mostly wheelchair bound.      Family History:  Family History  Problem Relation Age of Onset  . Aneurysm Father   . Cancer Daughter     Breast and lung cancer  . Hypertension Son   . Hyperlipidemia Son  Allergies: Asacol; Aspirin; Butalbital-asa-caff-codeine; and Codeine  Meds: Prior to Admission medications   Medication Sig Start Date End Date Taking? Authorizing Provider  acetaminophen (TYLENOL) 500 MG tablet Take 500 mg by mouth 2 (two) times daily.    Yes Historical Provider, MD  cloNIDine (CATAPRES) 0.2 MG tablet Take 0.2 mg by mouth daily.   Yes Historical Provider, MD  digoxin (LANOXIN) 0.125 MG tablet Take 125 mcg by mouth daily.     Yes Historical Provider, MD  donepezil (ARICEPT) 10 MG tablet Take 10 mg by mouth at bedtime.    Yes Historical Provider, MD  enalapril (VASOTEC) 20 MG tablet Take 20 mg by mouth 2 (two) times daily.     Yes Historical Provider, MD  levothyroxine (SYNTHROID, LEVOTHROID) 125 MCG tablet Take 125 mcg by  mouth daily.   Yes Historical Provider, MD  metoprolol succinate (TOPROL-XL) 100 MG 24 hr tablet Take 100 mg by mouth 2 (two) times daily. Take with or immediately following a meal.   Yes Historical Provider, MD  Multiple Vitamins-Minerals (PRESERVISION/LUTEIN) CAPS Take by mouth 2 (two) times daily.     Yes Historical Provider, MD  Probiotic Product (ALIGN) 4 MG CAPS Take 4 mg by mouth daily.    Yes Historical Provider, MD  warfarin (COUMADIN) 1 MG tablet Take 1-1.5 mg by mouth daily. 1 mg every other day alternating with 1.5 mg (last dose 1 mg)   Yes Historical Provider, MD    Physical Exam: Filed Vitals:   02/06/12 0747 02/06/12 0805 02/06/12 1220 02/06/12 1416  BP:  180/87 154/65 182/70  Pulse:  67 71 66  Temp:  97.9 F (36.6 C) 97.6 F (36.4 C) 97.5 F (36.4 C)  TempSrc:  Oral Oral Oral  Resp:  16 16 16   Height:   5\' 5"  (1.651 m)   Weight:   58.06 kg (128 lb)   SpO2: 98% 98% 96% 98%     Physical Exam: Blood pressure 182/70, pulse 66, temperature 97.5 F (36.4 C), temperature source Oral, resp. rate 16, height 5\' 5"  (1.651 m), weight 58.06 kg (128 lb), SpO2 98.00%. Gen: No acute distress. Head: Normocephalic, atraumatic. Eyes: Pupils round, equally reactive to light and accommodation. Extraocular movements intact. Sclera nonicteric. Mouth: Oropharynx clear. Fair dentition. If his membranes moist. Neck: Supple, no thyromegaly, lymphadenopathy, jugular venous distention. Chest: Lungs clear to auscultation bilaterally with good air movement. CV: Regular rate, and rhythm. No murmurs, rubs, or gallops Abdomen: Soft, nontender, nondistended with normal active bowel sounds. Extremities: Venous stasis dermatitis and trace edema bilaterally. Skin: Hemosiderin deposits to the lower extremities. Neuro: Alert and oriented. Cranial nerves II through XII grossly intact. Nonfocal. Psych: Mood and affect bright.  Labs on Admission:  Basic Metabolic Panel:  Lab 02/06/12 9604  NA 138   K 4.4  CL 104  CO2 27  GLUCOSE 100*  BUN 19  CREATININE 0.98  CALCIUM 8.9  MG --  PHOS --   CBC:  Lab 02/06/12 1310 02/06/12 0815  WBC -- 5.7  NEUTROABS -- 3.5  HGB 10.4* 11.5*  HCT 32.2* 35.7*  MCV -- 91.5  PLT -- 196    Radiological Exams on Admission: No results found.  Assessment/Plan Principal Problem:  *Rectal bleeding in the setting of Coumadin therapy and history of GI diverticular bleed  Likely diverticular given her history of diverticulosis and diverticular bleeding in the past.  Treat conservatively with IV fluids and holding her Coumadin. Given 1 mg of vitamin K as well to reverse  her INR.  Check hemoglobin and hematocrit every 8 hours and transfuse if hemoglobin falls below 8 mg/dL. Active Problems:  HYPERTENSION, BENIGN  Resume preadmission antihypertensives.  Atrial fibrillation / chronic anticoagulation  Heart tones sounds normal sinus. Rate controlled. Hold Coumadin. Continue digoxin and beta blocker.  Acute blood loss anemia  Hemoglobin dropping. Monitor closely and transfuse if indicated.  Hypothyroidism  Continue Synthroid.  Mild cognitive impairment  Continue Aricept.  Code Status: Full code. Family Communication: Junita Push (daughter) [phone].  Updated at bedside. Disposition Plan: Back to her retirement community when stable.  Time spent: One hour.  Hermon Zea [facility] Pager [phone]  If 7PM-7AM, please contact night-coverage [email or web address] Password [facility] 02/06/2012, 4:42 PM

## 2012-02-06 NOTE — ED Notes (Signed)
ZOX:[id number]<AV> Expected date:02/06/12<BR> Expected time: 7:37 AM<BR> Means of arrival:Ambulance<BR> Comments:<BR> 89yoF/rectal bleeding

## 2012-02-06 NOTE — ED Notes (Signed)
Pt here via EMS for [facility], staff found bright red bleed in diaper this am pt is on Coumadin.

## 2012-02-06 NOTE — ED Notes (Signed)
Pt  Had a small bm with lots of clots and bloody EDMD aware

## 2012-02-06 NOTE — ED Provider Notes (Signed)
History     CSN: [id number]  Arrival date & time 02/06/12  0746   First MD Initiated Contact with Patient 02/06/12 0805      Chief Complaint  Patient presents with  . GI Bleeding    (Consider location/radiation/quality/duration/timing/severity/associated sxs/prior treatment) HPI Comments: This is a 79 year old female, who presents to the emergency department with chief complaint of GI bleed. Patient has a history remarkable for diverticular bleeding. Her caretaker reported seen "a lot" of bright red blood this morning in her diaper. She is currently taking Coumadin for A. fib. Her Coumadin level was 4.97 on 01/23/12, it was then held until 01/31/12, and then restarted at 1 mg and 1.15 mg every other day. She denies having headache, chest pain, shortness of breath, nausea, vomiting, diarrhea, constipation, dysuria, numbness and tingling of the extremities. Patient denies pain at this time.  The history is provided by the patient. No language interpreter was used.    Past Medical History  Diagnosis Date  . Hypertension   . Hypothyroidism   . Osteoarthritis   . Osteoporosis   . Diverticulosis   . Chronic diarrhea   . Macular degeneration   . Kyphosis     of thoracic spine  . History of GI diverticular bleed Sept 3, 2003  . Asthma   . Toxic goiter     hx w subsequent ablation and hypothyroid state  . Mild cognitive disorder   . Bronchiectasis     left lower lobe noted on x-ray 2/05  . Hiatal hernia     hx w repair  . Urinary incontinence   . Urinary tract bacterial infections   . Atrial fibrillation Sept 12, 2005    new onset  . Right renal atrophy 2005    Past Surgical History  Procedure Date  . Hiatal hernia repair     History reviewed. No pertinent family history.  History  Substance Use Topics  . Smoking status: Never Smoker   . Smokeless tobacco: Never Used  . Alcohol Use: No    OB History    Grav Para Term Preterm Abortions TAB SAB Ect Mult Living              Review of Systems  All other systems reviewed and are negative.    Allergies  Asacol; Aspirin; and Butalbital-asa-caff-codeine  Home Medications   Current Outpatient Rx  Name  Route  Sig  Dispense  Refill  . ACETAMINOPHEN 500 MG PO TABS   Oral   Take 500 mg by mouth at bedtime.          Marland Kitchen CLONIDINE HCL 0.1 MG PO TABS   Oral   Take 0.1 mg by mouth daily.         Marland Kitchen DIGOXIN 0.125 MG PO TABS   Oral   Take 125 mcg by mouth daily.           . DONEPEZIL HCL 10 MG PO TABS   Oral   Take 10 mg by mouth at bedtime as needed.           . ENALAPRIL MALEATE 20 MG PO TABS   Oral   Take 20 mg by mouth 2 (two) times daily.           Marland Kitchen ESCITALOPRAM OXALATE 5 MG PO TABS   Oral   Take 5 mg by mouth daily.         Marland Kitchen LEVOTHYROXINE SODIUM 100 MCG PO TABS   Oral   Take 100  mcg by mouth daily.         Marland Kitchen METOPROLOL TARTRATE 50 MG PO TABS   Oral   Take 100 mg by mouth 2 (two) times daily.          Marland Kitchen PRESERVISION/LUTEIN PO CAPS   Oral   Take by mouth 2 (two) times daily.           Marland Kitchen ALIGN 4 MG PO CAPS   Oral   Take 4 mg by mouth daily.          . WARFARIN SODIUM 2 MG PO TABS   Oral   Take 2 mg by mouth as directed.             BP 180/87  Pulse 67  Temp 97.9 F (36.6 C) (Oral)  Resp 16  SpO2 98%  Physical Exam  Nursing note and vitals reviewed. Constitutional: She is oriented to person, place, and time. She appears well-developed and well-nourished.  HENT:  Head: Normocephalic and atraumatic.  Eyes: Conjunctivae normal and EOM are normal. Pupils are equal, round, and reactive to light.  Neck: Normal range of motion. Neck supple.  Cardiovascular: Normal rate and regular rhythm.  Exam reveals no gallop and no friction rub.   No murmur heard. Pulmonary/Chest: Effort normal and breath sounds normal. No respiratory distress. She has no wheezes. She has no rales. She exhibits no tenderness.  Abdominal: Soft. Bowel sounds are normal. She  exhibits no distension and no mass. There is no tenderness. There is no rebound and no guarding.  Genitourinary: Rectal exam shows no external hemorrhoid, no mass and no tenderness. Guaiac positive stool.       Approximately 5 mL of maroon colored blood was expelled from the rectum with rectal exam.  Rectal tone was normal, no external hemorrhoids.  Musculoskeletal: Normal range of motion. She exhibits no edema and no tenderness.  Neurological: She is alert and oriented to person, place, and time.  Skin: Skin is warm and dry.  Psychiatric: She has a normal mood and affect. Her behavior is normal. Judgment and thought content normal.    ED Course  Procedures (including critical care time)   Labs Reviewed  CBC WITH DIFFERENTIAL  BASIC METABOLIC PANEL   Results for orders placed during the hospital encounter of 02/06/12  CBC WITH DIFFERENTIAL      Component Value Range   WBC 5.7  4.0 - 10.5 K/uL   RBC 3.90  3.87 - 5.11 MIL/uL   Hemoglobin 11.5 (*) 12.0 - 15.0 g/dL   HCT 40.9 (*) 81.1 - 91.4 %   MCV 91.5  78.0 - 100.0 fL   MCH 29.5  26.0 - 34.0 pg   MCHC 32.2  30.0 - 36.0 g/dL   RDW 78.2  95.6 - 21.3 %   Platelets 196  150 - 400 K/uL   Neutrophils Relative 61  43 - 77 %   Neutro Abs 3.5  1.7 - 7.7 K/uL   Lymphocytes Relative 22  12 - 46 %   Lymphs Abs 1.3  0.7 - 4.0 K/uL   Monocytes Relative 9  3 - 12 %   Monocytes Absolute 0.5  0.1 - 1.0 K/uL   Eosinophils Relative 6 (*) 0 - 5 %   Eosinophils Absolute 0.3  0.0 - 0.7 K/uL   Basophils Relative 1  0 - 1 %   Basophils Absolute 0.1  0.0 - 0.1 K/uL  BASIC METABOLIC PANEL      Component Value  Range   Sodium 138  135 - 145 mEq/L   Potassium 4.4  3.5 - 5.1 mEq/L   Chloride 104  96 - 112 mEq/L   CO2 27  19 - 32 mEq/L   Glucose, Bld 100 (*) 70 - 99 mg/dL   BUN 19  6 - 23 mg/dL   Creatinine, Ser 1.61  0.50 - 1.10 mg/dL   Calcium 8.9  8.4 - 09.6 mg/dL   GFR calc non Af Amer 49 (*) >90 mL/min   GFR calc Af Amer 56 (*) >90 mL/min    PROTIME-INR      Component Value Range   Prothrombin Time 27.4 (*) 11.6 - 15.2 seconds   INR 2.71 (*) 0.00 - 1.49  OCCULT BLOOD, POC DEVICE      Component Value Range   Fecal Occult Bld POSITIVE (*) NEGATIVE  TYPE AND SCREEN      Component Value Range   ABO/RH(D) PENDING     Antibody Screen PENDING     Sample Expiration 02/09/2012       1. GI bleeding       MDM  79 year old female with GI bleed.  Discussed this patient with Dr. Denton Lank.  Will wait to get labs, and likely admit.  10:07 AM This patient was seen by and discussed with Dr. Denton Lank, patient will be admitted by a trial hospitalist, Dr. Darnelle Catalan.        Roxy Horseman, PA-C 02/06/12 1008

## 2012-02-07 DIAGNOSIS — K5730 Diverticulosis of large intestine without perforation or abscess without bleeding: Secondary | ICD-10-CM

## 2012-02-07 DIAGNOSIS — D62 Acute posthemorrhagic anemia: Secondary | ICD-10-CM

## 2012-02-07 DIAGNOSIS — I4891 Unspecified atrial fibrillation: Secondary | ICD-10-CM

## 2012-02-07 DIAGNOSIS — I10 Essential (primary) hypertension: Secondary | ICD-10-CM

## 2012-02-07 LAB — COMPREHENSIVE METABOLIC PANEL
ALT: 5 U/L (ref 0–35)
AST: 11 U/L (ref 0–37)
CO2: 24 mEq/L (ref 19–32)
Calcium: 8.2 mg/dL — ABNORMAL LOW (ref 8.4–10.5)
Sodium: 140 mEq/L (ref 135–145)
Total Protein: 5.1 g/dL — ABNORMAL LOW (ref 6.0–8.3)

## 2012-02-07 LAB — CBC
HCT: 27.5 % — ABNORMAL LOW (ref 36.0–46.0)
Hemoglobin: 8.9 g/dL — ABNORMAL LOW (ref 12.0–15.0)
MCHC: 32.4 g/dL (ref 30.0–36.0)
MCV: 92.2 fL (ref 78.0–100.0)
Platelets: 158 10*3/uL (ref 150–400)
Platelets: 167 10*3/uL (ref 150–400)
RBC: 2.99 MIL/uL — ABNORMAL LOW (ref 3.87–5.11)
RBC: 2.95 MIL/uL — ABNORMAL LOW (ref 3.87–5.11)
RDW: 14.5 % (ref 11.5–15.5)
RDW: 14.6 % (ref 11.5–15.5)
WBC: 5.7 10*3/uL (ref 4.0–10.5)
WBC: 6.5 10*3/uL (ref 4.0–10.5)

## 2012-02-07 LAB — PROTIME-INR
INR: 2 — ABNORMAL HIGH (ref 0.00–1.49)
Prothrombin Time: 21.9 seconds — ABNORMAL HIGH (ref 11.6–15.2)

## 2012-02-07 MED ORDER — PHYTONADIONE 5 MG PO TABS
5.0000 mg | ORAL_TABLET | Freq: Once | ORAL | Status: AC
  Administered 2012-02-07: 5 mg via ORAL
  Filled 2012-02-07: qty 1

## 2012-02-07 NOTE — Consult Note (Signed)
[facility] Consultation  Referring Provider:   [facility] Primary Care Physician:  [name], MD Primary Gastroenterologist:     Stan Head, MD  Reason for Consultation:  Rectal bleeding      HPI: [name] is a 79 y.o. female transferred from nursing home yesterday after bleed was found in her diaper. Patient not sure she is bleeding but "that is what they tell me" Her family is in room and helps with history.  Patient reported abdominal cramping over the last couple of weeks. She is on chronic coumadin for atrial fibrillation. Hemoglobin yesterday morning was 11.5, it had drifted to 8.9 at 5am. BUN is normal. INR yesterday 2.71 She is hemodynamically stable but has continued to bleed. No nausea or vomiting. . Per family, patient had a similar bleed about 12 years ago.    Past Medical History  Diagnosis Date  . Hypertension   . Hypothyroidism   . Osteoarthritis   . Osteoporosis   . Diverticulosis   . Chronic diarrhea   . Macular degeneration   . Kyphosis     of thoracic spine  . History of GI diverticular bleed Sept 3, 2003  . Toxic goiter     hx w subsequent ablation and hypothyroid state  . Mild cognitive disorder   . Bronchiectasis     left lower lobe noted on x-ray 2/05  . Hiatal hernia     hx w repair  . Urinary incontinence   . Urinary tract bacterial infections   . Atrial fibrillation Sept 12, 2005    new onset  . Right renal atrophy 2005  . Pacemaker   . H/O Clostridium difficile infection   . Macular degeneration   . Diverticulosis     Past Surgical History  Procedure Date  . Repair of hiatal hernia and tube, gastrostomy. 08/06/1999  . Total knee arthroplasty 10/13/2003    Right  . Laparoscopic repair of ventral incisional hernia with 04/21/2000    Family History  Problem Relation Age of Onset  . Aneurysm Father   . Cancer Daughter     Breast and lung cancer  . Hypertension Son   . Hyperlipidemia Son     History    Substance Use Topics  . Smoking status: Never Smoker   . Smokeless tobacco: Never Used  . Alcohol Use: No     Comment: Occasional wine.    Prior to Admission medications   Medication Sig Start Date End Date Taking? Authorizing Provider  acetaminophen (TYLENOL) 500 MG tablet Take 500 mg by mouth 2 (two) times daily.    Yes Historical Provider, MD  cloNIDine (CATAPRES) 0.2 MG tablet Take 0.2 mg by mouth daily.   Yes Historical Provider, MD  digoxin (LANOXIN) 0.125 MG tablet Take 125 mcg by mouth daily.     Yes Historical Provider, MD  donepezil (ARICEPT) 10 MG tablet Take 10 mg by mouth at bedtime.    Yes Historical Provider, MD  enalapril (VASOTEC) 20 MG tablet Take 20 mg by mouth 2 (two) times daily.     Yes Historical Provider, MD  levothyroxine (SYNTHROID, LEVOTHROID) 125 MCG tablet Take 125 mcg by mouth daily.   Yes Historical Provider, MD  metoprolol succinate (TOPROL-XL) 100 MG 24 hr tablet Take 100 mg by mouth 2 (two) times daily. Take with or immediately following a meal.   Yes Historical Provider, MD  Multiple Vitamins-Minerals (PRESERVISION/LUTEIN) CAPS Take by mouth 2 (two) times daily.     Yes Historical  Provider, MD  Probiotic Product (ALIGN) 4 MG CAPS Take 4 mg by mouth daily.    Yes Historical Provider, MD  warfarin (COUMADIN) 1 MG tablet Take 1-1.5 mg by mouth daily. 1 mg every other day alternating with 1.5 mg (last dose 1 mg)   Yes Historical Provider, MD    Current Facility-Administered Medications  Medication Dose Route Frequency Provider Last Rate Last Dose  . 0.9 %  sodium chloride infusion   Intravenous Continuous Maryruth Bun Rama, MD 75 mL/hr at 02/07/12 1045    . acetaminophen (TYLENOL) tablet 650 mg  650 mg Oral Q6H PRN Maryruth Bun Rama, MD       Or  . acetaminophen (TYLENOL) suppository 650 mg  650 mg Rectal Q6H PRN Christina P Rama, MD      . acetaminophen (TYLENOL) tablet 500 mg  500 mg Oral BID Christina P Rama, MD      . acidophilus (RISAQUAD) capsule 1  capsule  1 capsule Oral Daily Maryruth Bun Rama, MD   1 capsule at 02/07/12 1032  . cloNIDine (CATAPRES) tablet 0.2 mg  0.2 mg Oral Daily Maryruth Bun Rama, MD   0.2 mg at 02/07/12 1033  . digoxin (LANOXIN) tablet 125 mcg  125 mcg Oral Daily Maryruth Bun Rama, MD   125 mcg at 02/07/12 1033  . donepezil (ARICEPT) tablet 10 mg  10 mg Oral QHS Maryruth Bun Rama, MD   10 mg at 02/06/12 2140  . enalapril (VASOTEC) tablet 20 mg  20 mg Oral BID Maryruth Bun Rama, MD   20 mg at 02/07/12 1032  . levothyroxine (SYNTHROID, LEVOTHROID) tablet 125 mcg  125 mcg Oral QAC breakfast Maryruth Bun Rama, MD   125 mcg at 02/07/12 0719  . metoprolol succinate (TOPROL-XL) 24 hr tablet 100 mg  100 mg Oral BID Maryruth Bun Rama, MD   100 mg at 02/07/12 1033  . multivitamin-lutein (OCUVITE-LUTEIN) capsule 1 capsule  1 capsule Oral Daily Maryruth Bun Rama, MD   1 capsule at 02/07/12 1034  . ondansetron (ZOFRAN) tablet 4 mg  4 mg Oral Q6H PRN Christina P Rama, MD       Or  . ondansetron (ZOFRAN) injection 4 mg  4 mg Intravenous Q6H PRN Christina P Rama, MD      . polyethylene glycol (MIRALAX / GLYCOLAX) packet 17 g  17 g Oral Daily PRN Maryruth Bun Rama, MD        Allergies as of 02/06/2012 - Review Complete 02/06/2012  Allergen Reaction Noted  . Asacol (mesalamine)  09/28/2011  . Aspirin Other (See Comments)   . Butalbital-asa-caff-codeine  09/28/2011  . Codeine Other (See Comments) 09/28/2011     Review of Systems:  Not sure reliable today as patient not even aware she is bleeding.     PHYSICAL EXAM: Vital signs in last 24 hours: Temp:  [97.5 F (36.4 C)-97.7 F (36.5 C)] 97.7 F (36.5 C) (01/07 0524) Pulse Rate:  [66-71] 66  (01/07 0524) Resp:  [16-18] 18  (01/07 0524) BP: (154-185)/(53-70) 178/62 mmHg (01/07 1032) SpO2:  [96 %-98 %] 97 % (01/07 0524) Weight:  [124 lb 5.4 oz (56.4 kg)-128 lb (58.06 kg)] 124 lb 5.4 oz (56.4 kg) (01/07 0524) Last BM Date: 02/06/12 General:   Pleasant white female in NAD Head:   Normocephalic and atraumatic. Eyes:   No icterus.   Conjunctiva pale. Ears:  Normal auditory acuity. Neck:  Supple; no masses felt Lungs:  Respirations even and unlabored. Occasional crackle LLL. Heart:  Regular rate and rhythm Abdomen:  Soft, nondistended, nontender. Normal bowel sounds. No appreciable masses or hepatomegaly.  Rectal:  Scant amount of bright red blood in vault. No obvious external lesions.   Msk:  Symmetrical without gross deformities.  Extremities:  Without edema. Skin:  Intact without significant lesions or rashes. Cervical Nodes:  No significant cervical adenopathy. Psych:  Alert and cooperative. Normal affect.  LAB RESULTS:  Basename 02/07/12 0457 02/06/12 2039 02/06/12 1310 02/06/12 0815  WBC 5.4 -- -- 5.7  HGB 8.9* 8.6* 10.4* --  HCT 27.5* 26.7* 32.2* --  PLT 158 -- -- 196   BMET  Basename 02/07/12 0457 02/06/12 0815  NA 140 138  K 3.9 4.4  CL 109 104  CO2 24 27  GLUCOSE 86 100*  BUN 21 19  CREATININE 0.90 0.98  CALCIUM 8.2* 8.9   LFT  Basename 02/07/12 0457  PROT 5.1*  ALBUMIN 2.3*  AST 11  ALT 5  ALKPHOS 65  BILITOT 0.4  BILIDIR --  IBILI --   PT/INR  Basename 02/06/12 0815  LABPROT 27.4*  INR 2.71*      PREVIOUS ENDOSCOPIES: none found but family reports colonoscopy 12 or so years ago  IMPRESSION / PLAN: 29. 79 year old female with hematochezia. She reported some abdominal cramping for two weeks, none today. Abdominal exam is benign and WBC is normal but ischemic colitis not excluded. Suspect diverticular hemorrhage.   Will continue supportive care, she may require blood.   Hopefully bleeding will cease but if not then will need to consider colonoscopy vrs RBC scan / interventional radiology .   She is eating solids right now for lunch. I will change her to liquids in case procedures are needed.   Possibly reverse INR since actively bleeding. I spoke to hospitalist. Will check INR (last one yesterday), if high then will give  more Vitamin K (see got 1mg  yesterday).  Will make sure she has type and screen done for blood.    2.history of severe clostridium difficile 2005       Thanks   LOS: 1 day   Willette Cluster  02/07/2012, 11:48 AM    ________________________________________________________________________  [facility] MD note:  I personally examined the patient, reviewed the data and agree with the assessment and plan described above.  Painless red rectal bleeding in 79 yo woman on coumadin for afib.  Will follow clinically, transfuse PRN, coumadin was reversed slightly with low dose vit k.   Rob Bunting, MD [facility] Pager [phone]

## 2012-02-07 NOTE — Care Management Note (Signed)
    Page 1 of 1   02/07/2012     11:11:11 AM   CARE MANAGEMENT NOTE 02/07/2012  Patient:  [name], [name]   Account Number:  [id number]  Date Initiated:  02/07/2012  Documentation initiated by:  Lorenda Ishihara  Subjective/Objective Assessment:   79 yo female admitted from [facility] with LGIB.     Action/Plan:   Return to SNF when stable   Anticipated DC Date:  02/10/2012   Anticipated DC Plan:  SKILLED NURSING FACILITY  In-house referral  Clinical Social Worker      DC Planning Services  CM consult      Choice offered to / List presented to:             Status of service:  Completed, signed off Medicare Important Message given?   (If response is "NO", the following Medicare IM given date fields will be blank) Date Medicare IM given:   Date Additional Medicare IM given:    Discharge Disposition:  SKILLED NURSING FACILITY  Per UR Regulation:  Reviewed for med. necessity/level of care/duration of stay  If discussed at Long Length of Stay Meetings, dates discussed:    Comments:

## 2012-02-07 NOTE — Progress Notes (Signed)
[facility] PROGRESS NOTE  [name] WJX:[id number] DOB: 10-Apr-1917 DOA: 02/06/2012 PCP: [name], MD  Brief narrative 79 year old female with history of diverticular bleeding approximately 11 years ago, atrial fibrillation on chronic Coumadin, was admitted on 1/6 with history of bright red bleeding per rectum. She reports some abdominal cramping for 2 weeks. No nausea or vomiting. She was admitted for further evaluation and management.  Assessment/Plan:  Rectal bleeding/diverticular bleeding  Clinically seems to be decreasing.  Coumadin on hold but INR still 2 after 1 mg of vitamin K. We'll provide additional 2.5 mg of vitamin K and follow INR in a.m.  [facility] consulted-plan is to keep off anticoagulation for approximately 10 days and then resume. If she has recurrent bleeding after that then may need further evaluation or revisit decision regarding continuing Coumadin. However if she worsens during this hospitalization she may need further workup.  Acute blood loss anemia on chronic anemia  Hemoglobin seems to have stabilized. Monitor closely. Transfuse if hemoglobin less than 8 g/dL.  Hypertension  Fluctuating blood pressures. Continue home regimen.  Atrial fibrillation  Controlled ventricular rate/sinus rhythm. Coumadin held. Continue digoxin and metoprolol.  Hypothyroidism  Continue Synthroid.  Dementia  Mental status at baseline.   Code Status: Full Family Communication: Discussed with patient's daughter at bedside. Disposition Plan: Discharge home when medically stable.   Consultants:  [facility]  Procedures:  None  Antibiotics:  None  HPI/Subjective: Denies complaints. Per nursing, one BM overnight with blood.  Objective: Filed Vitals:   02/07/12 0524 02/07/12 1032 02/07/12 1201 02/07/12 1310  BP: 182/59 178/62 176/58 153/61  Pulse: 66   65  Temp: 97.7 F (36.5 C)   97.6 F (36.4 C)  TempSrc:  Oral   Oral  Resp: 18   18  Height:      Weight: 56.4 kg (124 lb 5.4 oz)     SpO2: 97%   97%    Intake/Output Summary (Last 24 hours) at 02/07/12 1617 Last data filed at 02/07/12 1411  Gross per 24 hour  Intake   2630 ml  Output      0 ml  Net   2630 ml   Filed Weights   02/06/12 1220 02/07/12 0524  Weight: 58.06 kg (128 lb) 56.4 kg (124 lb 5.4 oz)    Exam:   General exam: Frail elderly female. Comfortable.  Respiratory system: Clear to auscultation. No increased work of breathing.  Crevasses system: First and second heart sounds heard, regular rate and rhythm. No JVD, murmurs or gallops.  Gastrointestinal system: Abdomen is nondistended soft and nontender. Normal bowel sounds heard.  Central system: Alert and oriented to self and partly to place. No focal neurological deficits.  Extremities symmetrical 5 x 5 Powers. Superficial cuts and bruises on legs.   Data Reviewed: Basic Metabolic Panel:  Lab 02/07/12 2130 02/06/12 0815  NA 140 138  K 3.9 4.4  CL 109 104  CO2 24 27  GLUCOSE 86 100*  BUN 21 19  CREATININE 0.90 0.98  CALCIUM 8.2* 8.9  MG -- --  PHOS -- --   Liver Function Tests:  Lab 02/07/12 0457  AST 11  ALT 5  ALKPHOS 65  BILITOT 0.4  PROT 5.1*  ALBUMIN 2.3*   No results found for this basename: LIPASE:5,AMYLASE:5 in the last 168 hours No results found for this basename: AMMONIA:5 in the last 168 hours CBC:  Lab 02/07/12 1330 02/07/12 0457 02/06/12 2039 02/06/12 1310 02/06/12 0815  WBC  5.7 5.4 -- -- 5.7  NEUTROABS -- -- -- -- 3.5  HGB 8.7* 8.9* 8.6* 10.4* 11.5*  HCT 27.2* 27.5* 26.7* 32.2* 35.7*  MCV 92.2 92.0 -- -- 91.5  PLT 167 158 -- -- 196   Cardiac Enzymes: No results found for this basename: CKTOTAL:5,CKMB:5,CKMBINDEX:5,TROPONINI:5 in the last 168 hours BNP (last 3 results) No results found for this basename: PROBNP:3 in the last 8760 hours CBG: No results found for this basename: GLUCAP:5 in the last 168 hours  No results  found for this or any previous visit (from the past 240 hour(s)).   Studies: No results found.  Scheduled Meds:    . acetaminophen  500 mg Oral BID  . acidophilus  1 capsule Oral Daily  . cloNIDine  0.2 mg Oral Daily  . digoxin  125 mcg Oral Daily  . donepezil  10 mg Oral QHS  . enalapril  20 mg Oral BID  . levothyroxine  125 mcg Oral QAC breakfast  . metoprolol succinate  100 mg Oral BID  . multivitamin-lutein  1 capsule Oral Daily   Continuous Infusions:    . sodium chloride 75 mL/hr at 02/07/12 1045    Principal Problem:  *Rectal bleeding Active Problems:  HYPERTENSION, BENIGN  Atrial fibrillation  History of GI diverticular bleed  Acute blood loss anemia  Hypothyroidism  Mild cognitive impairment  Chronic anticoagulation    Time spent: 30 minutes    [facility]  [facility] Pager [phone]. If 8PM-8AM, please contact night-coverage at [email or web address], password [facility] 02/07/2012, 4:17 PM  LOS: 1 day

## 2012-02-07 NOTE — Clinical Social Work Psychosocial (Signed)
     Clinical Social Work Department BRIEF PSYCHOSOCIAL ASSESSMENT 02/07/2012  Patient:  [name], [name]     Account Number:  [id number]     Admit date:  02/06/2012  Clinical Social Worker:  Jacelyn Grip  Date/Time:  02/07/2012 03:00 PM  Referred by:  Physician  Date Referred:  02/07/2012 Referred for  SNF Placement   Other Referral:   Interview type:  Patient Other interview type:   and patient dtr and son-in-law at bedside    PSYCHOSOCIAL DATA Living Status:  FACILITY Admitted from facility:  [facility] Level of care:  Skilled Nursing Facility Primary support name:  [facility] [place]/daughter Primary support relationship to patient:  CHILD, ADULT Degree of support available:   strong    CURRENT CONCERNS Current Concerns  Post-Acute Placement   Other Concerns:    SOCIAL WORK ASSESSMENT / PLAN CSW received referral that pt admitted from [facility].    CSW met with pt, pt daughter, and pt son in law at bedside and had discussion re: disposition.    Pt daughter confirmed pt from [facility] SNF and plan is to return when medically stable for discharge.    CSW contacted [facility] SNF and confirmed pt able to return at d/c.    CSW to facilitate pt discharge needs when pt medically ready for discharge.   Assessment/plan status:  Psychosocial Support/Ongoing Assessment of Needs Other assessment/ plan:   discharge planning   Information/referral to community resources:   Referral back to [facility]    PATIENTS/FAMILYS RESPONSE TO PLAN OF CARE: Pt alert and oriented x 4. Pt resting when CSW entered room, but awoke at end of conversation. Pt and pt family agreeable to return to [facility].

## 2012-02-07 NOTE — ED Provider Notes (Signed)
Medical screening examination/treatment/procedure(s) were conducted as a shared visit with non-physician practitioner(s) and myself.  I personally evaluated the patient during the encounter Pt with hx afib, diverticulosis w bleeding, presents w rectal bleeding. Vss. abd soft nt. Labs. Admit.   Suzi Roots, MD 02/07/12 [phone]

## 2012-02-08 LAB — BASIC METABOLIC PANEL
BUN: 15 mg/dL (ref 6–23)
Chloride: 109 mEq/L (ref 96–112)
GFR calc Af Amer: 62 mL/min — ABNORMAL LOW (ref >90)
GFR calc non Af Amer: 54 mL/min — ABNORMAL LOW (ref >90)
Glucose, Bld: 97 mg/dL (ref 70–99)
Potassium: 3.5 mEq/L (ref 3.5–5.1)
Sodium: 142 mEq/L (ref 135–145)

## 2012-02-08 LAB — CBC
HCT: 31.5 % — ABNORMAL LOW (ref 36.0–46.0)
HCT: 31.6 % — ABNORMAL LOW (ref 36.0–46.0)
Hemoglobin: 9.9 g/dL — ABNORMAL LOW (ref 12.0–15.0)
Hemoglobin: 10 g/dL — ABNORMAL LOW (ref 12.0–15.0)
MCH: 29.2 pg (ref 26.0–34.0)
RBC: 3.39 MIL/uL — ABNORMAL LOW (ref 3.87–5.11)
RBC: 3.41 MIL/uL — ABNORMAL LOW (ref 3.87–5.11)

## 2012-02-08 LAB — PROTIME-INR
INR: 1.5 — ABNORMAL HIGH (ref 0.00–1.49)
Prothrombin Time: 17.7 seconds — ABNORMAL HIGH (ref 11.6–15.2)

## 2012-02-08 MED ORDER — PEG-KCL-NACL-NASULF-NA ASC-C 100 G PO SOLR
1.0000 | Freq: Once | ORAL | Status: AC
  Administered 2012-02-08: 100 g via ORAL
  Filled 2012-02-08: qty 1

## 2012-02-08 NOTE — Progress Notes (Signed)
[facility] PROGRESS NOTE  [name] BJY:[id number] DOB: [birth year] DOA: 02/06/2012 PCP: [name], MD  Brief narrative 79 year old female with history of diverticular bleeding approximately 11 years ago, atrial fibrillation on chronic Coumadin, was admitted on 1/6 with history of bright red bleeding per rectum. She reports some abdominal cramping for 2 weeks. No nausea or vomiting. She was admitted for further evaluation and management.  Assessment/Plan:  Rectal bleeding/diverticular bleeding  Continues to have rectal bleeding-3 episodes in last 24 hours. Hemodynamically and H&H stable.  Coumadin on hold. INR reversed to 1.5 with vitamin [facility] gastro[facility] consultation appreciated. They plan for colonoscopy on 1/9.  Acute blood loss anemia on chronic anemia  Hemoglobin seems to have stabilized. Monitor closely. Transfuse if hemoglobin less than 8 g/dL.  Hypertension  Fluctuating blood pressures-per. Continue home regimen.  Atrial fibrillation  Controlled ventricular rate/sinus rhythm. Coumadin held. Continue digoxin and metoprolol.  Hypothyroidism  Continue Synthroid.  Dementia  Mental status at baseline.   Code Status: Full Family Communication: Discussed with patient's daughter Ms. [name] and her spouse at bedside. Disposition Plan: Discharge home when medically stable.   Consultants:  [facility]  Procedures:  None  Antibiotics:  None  HPI/Subjective: Patient seen this morning. Denied complaints. Hungry. Per family, 3 bloody BMs since yesterday  Objective: Filed Vitals:   02/07/12 1310 02/07/12 2009 02/08/12 0610 02/08/12 1350  BP: 153/61 174/72 154/82 134/63  Pulse: 65 72 63 66  Temp: 97.6 F (36.4 C) 98.1 F (36.7 C) 97.9 F (36.6 C) 97.8 F (36.6 C)  TempSrc: Oral Oral Oral Oral  Resp: 18 16 16 16   Height:      Weight:   55.2 kg (121 lb 11.1 oz)   SpO2: 97% 100% 99% 100%    Intake/Output  Summary (Last 24 hours) at 02/08/12 1825 Last data filed at 02/08/12 1508  Gross per 24 hour  Intake 1033.83 ml  Output      2 ml  Net 1031.83 ml   Filed Weights   02/06/12 1220 02/07/12 0524 02/08/12 0610  Weight: 58.06 kg (128 lb) 56.4 kg (124 lb 5.4 oz) 55.2 kg (121 lb 11.1 oz)    Exam:   General exam: Frail elderly female. Comfortable. Was seen sitting on the chair.  Respiratory system: Clear to auscultation. No increased work of breathing.  Crevasses system: First and second heart sounds heard, regular rate and rhythm. No JVD, murmurs or gallops.  Gastrointestinal system: Abdomen is nondistended soft and nontender. Normal bowel sounds heard.  Central system: Alert and oriented to self and partly to place. No focal neurological deficits.  Extremities symmetrical 5 x 5 Powers. Superficial cuts and bruises on legs.   Data Reviewed: Basic Metabolic Panel:  Lab 02/08/12 0865 02/07/12 0457 02/06/12 0815  NA 142 140 138  K 3.5 3.9 4.4  CL 109 109 104  CO2 24 24 27   GLUCOSE 97 86 100*  BUN 15 21 19   CREATININE 0.90 0.90 0.98  CALCIUM 8.9 8.2* 8.9  MG -- -- --  PHOS -- -- --   Liver Function Tests:  Lab 02/07/12 0457  AST 11  ALT 5  ALKPHOS 65  BILITOT 0.4  PROT 5.1*  ALBUMIN 2.3*   No results found for this basename: LIPASE:5,AMYLASE:5 in the last 168 hours No results found for this basename: AMMONIA:5 in the last 168 hours CBC:  Lab 02/08/12 0610 02/07/12 2155 02/07/12 1330 02/07/12 0457 02/06/12 2039 02/06/12 0815  WBC 5.8 6.5  5.7 5.4 -- 5.7  NEUTROABS -- -- -- -- -- 3.5  HGB 9.9* 8.9* 8.7* 8.9* 8.6* --  HCT 31.5* 27.5* 27.2* 27.5* 26.7* --  MCV 92.9 91.4 92.2 92.0 -- 91.5  PLT 197 168 167 158 -- 196   Cardiac Enzymes: No results found for this basename: CKTOTAL:5,CKMB:5,CKMBINDEX:5,TROPONINI:5 in the last 168 hours BNP (last 3 results) No results found for this basename: PROBNP:3 in the last 8760 hours CBG: No results found for this basename:  GLUCAP:5 in the last 168 hours  No results found for this or any previous visit (from the past 240 hour(s)).   Studies: No results found.  Scheduled Meds:    . acetaminophen  500 mg Oral BID  . acidophilus  1 capsule Oral Daily  . cloNIDine  0.2 mg Oral Daily  . digoxin  125 mcg Oral Daily  . donepezil  10 mg Oral QHS  . enalapril  20 mg Oral BID  . levothyroxine  125 mcg Oral QAC breakfast  . metoprolol succinate  100 mg Oral BID  . multivitamin-lutein  1 capsule Oral Daily  . peg 3350 powder  1 kit Oral Once   Continuous Infusions:    . sodium chloride 20 mL (02/08/12 0018)    Principal Problem:  *Rectal bleeding Active Problems:  HYPERTENSION, BENIGN  Atrial fibrillation  History of GI diverticular bleed  Acute blood loss anemia  Hypothyroidism  Mild cognitive impairment  Chronic anticoagulation    Time spent: 30 minutes    [facility]  [facility] Pager [phone]. If 8PM-8AM, please contact night-coverage at [email or web address], password [facility] 02/08/2012, 6:25 PM  LOS: 2 days

## 2012-02-08 NOTE — Progress Notes (Signed)
[facility] Progress Note  SUBJECTIVE: Hungry  OBJECTIVE:  Vital signs in last 24 hours: Temp:  [97.6 F (36.4 C)-98.1 F (36.7 C)] 97.9 F (36.6 C) (01/08 0610) Pulse Rate:  [63-72] 63  (01/08 0610) Resp:  [16-18] 16  (01/08 0610) BP: (153-176)/(58-82) 154/82 mmHg (01/08 0610) SpO2:  [97 %-100 %] 99 % (01/08 0610) Weight:  [121 lb 11.1 oz (55.2 kg)] 121 lb 11.1 oz (55.2 kg) (01/08 0610) Last BM Date: 02/06/12 General:    white female in NAD Abdomen:  Soft, nontender and nondistended. Normal bowel sounds. Psych:  Cooperative. Normal mood and affect.   Lab Results:  Basename 02/08/12 0610 02/07/12 2155 02/07/12 1330  WBC 5.8 6.5 5.7  HGB 9.9* 8.9* 8.7*  HCT 31.5* 27.5* 27.2*  PLT 197 168 167   BMET  Basename 02/08/12 0610 02/07/12 0457 02/06/12 0815  NA 142 140 138  K 3.5 3.9 4.4  CL 109 109 104  CO2 24 24 27   GLUCOSE 97 86 100*  BUN 15 21 19   CREATININE 0.90 0.90 0.98  CALCIUM 8.9 8.2* 8.9   LFT  Basename 02/07/12 0457  PROT 5.1*  ALBUMIN 2.3*  AST 11  ALT 5  ALKPHOS 65  BILITOT 0.4  BILIDIR --  IBILI --   PT/INR  Basename 02/08/12 0855 02/07/12 1330  LABPROT 17.7* 21.9*  INR 1.50* 2.00*      ASSESSMENT / PLAN:  48. 79 year old female with hematochezia. Suspect diverticular hemorrhage. She has continued to pass some blood, last episode this am but hgb stable (actually rose overnight). Long conversation with family, plan is to proceed with a colonoscopy in am for evaluation (even if bleeding ceases) as patient hasn't had one in over 12 years.  2. Coagulopathy secondary to coumadin. INR down to 1.5 today. Coumadin on hold, s/p Vit K.      LOS: 2 days   Willette Cluster  02/08/2012, 10:49 AM   ________________________________________________________________________  [facility] MD note:  I personally examined the patient, reviewed the data and agree with the assessment and plan described above.   Rob Bunting, MD [facility]  Gastroenterology Pager [phone]

## 2012-02-09 ENCOUNTER — Encounter (HOSPITAL_COMMUNITY)
Admission: EM | Disposition: A | Discharge status: Nursing Home (Includes ICF) | Payer: Self-pay | Source: Home / Self Care | Attending: Internal Medicine

## 2012-02-09 DIAGNOSIS — K5730 Diverticulosis of large intestine without perforation or abscess without bleeding: Secondary | ICD-10-CM

## 2012-02-09 HISTORY — PX: COLONOSCOPY: SHX5424

## 2012-02-09 LAB — CBC
MCH: 29.4 pg (ref 26.0–34.0)
MCHC: 31.2 g/dL (ref 30.0–36.0)
Platelets: 172 10*3/uL (ref 150–400)
RDW: 14.9 % (ref 11.5–15.5)

## 2012-02-09 LAB — BASIC METABOLIC PANEL
Calcium: 8.8 mg/dL (ref 8.4–10.5)
GFR calc Af Amer: 61 mL/min — ABNORMAL LOW (ref >90)
GFR calc non Af Amer: 52 mL/min — ABNORMAL LOW (ref >90)
Glucose, Bld: 90 mg/dL (ref 70–99)
Sodium: 144 mEq/L (ref 135–145)

## 2012-02-09 SURGERY — COLONOSCOPY
Anesthesia: Moderate Sedation

## 2012-02-09 MED ORDER — HYDRALAZINE HCL 20 MG/ML IJ SOLN: 10.0000 mg | Freq: Four times a day (QID) | INTRAMUSCULAR | Status: DC | PRN

## 2012-02-09 MED ORDER — SODIUM CHLORIDE 0.45 % IV SOLN: INTRAVENOUS | Status: DC

## 2012-02-09 MED ORDER — FENTANYL CITRATE 0.05 MG/ML IJ SOLN
INTRAMUSCULAR | Status: AC
  Filled 2012-02-09: qty 2

## 2012-02-09 MED ORDER — FENTANYL CITRATE 0.05 MG/ML IJ SOLN
INTRAMUSCULAR | Status: DC | PRN
  Administered 2012-02-09: 25 ug via INTRAVENOUS
  Administered 2012-02-09 (×4): 12.5 ug via INTRAVENOUS

## 2012-02-09 MED ORDER — MIDAZOLAM HCL 5 MG/5ML IJ SOLN
INTRAMUSCULAR | Status: DC | PRN
  Administered 2012-02-09 (×3): 1 mg via INTRAVENOUS

## 2012-02-09 MED ORDER — MIDAZOLAM HCL 10 MG/2ML IJ SOLN
INTRAMUSCULAR | Status: AC
  Filled 2012-02-09: qty 2

## 2012-02-09 NOTE — Op Note (Signed)
[facility] [address] [place], [address]   COLONOSCOPY PROCEDURE REPORT  PATIENT: [name], [name]  MR#: [id number] BIRTHDATE: December 06, 1917 , 89  yrs. old GENDER: Female ENDOSCOPIST: Rachael Fee, MD PROCEDURE DATE:  02/09/2012 PROCEDURE:   Colonoscopy, diagnostic ASA CLASS:   Class IV INDICATIONS:rectal bleeding, painless. MEDICATIONS: Fentanyl 75 mcg IV and Versed 3 mg IV  DESCRIPTION OF PROCEDURE:   After the risks benefits and alternatives of the procedure were thoroughly explained, informed consent was obtained.  A digital rectal exam revealed no abnormalities of the rectum.   The Pentax Colonoscope [id number] endoscope was introduced through the anus and advanced to the cecum, which was identified by both the appendix and ileocecal valve. No adverse events experienced.   The quality of the prep was good, using MoviPrep  The instrument was then slowly withdrawn as the colon was fully examined.   COLON FINDINGS: There were severe diverticular changes throughout left colon.  There was no recent or old blood.  The examination was otherwise normal.  Retroflexed views revealed no abnormalities. The time to cecum=3 minutes 00 seconds.  Withdrawal time=10 minutes 00 seconds.  The scope was withdrawn and the procedure completed. COMPLICATIONS: There were no complications.  ENDOSCOPIC IMPRESSION: There were severe diverticular changes throughout left colon.  This is almost certainly the source of her recent bleeding and it is unlikely to cause bleeding again. The examination was otherwise normal.  No polyps or cancers  RECOMMENDATIONS: OK to restart coumadin in 5 days.  The bleeding has clearly stopped and she can be discharged today from hospital.   eSigned:  Rachael Fee, MD 02/09/2012 12:30 PM

## 2012-02-09 NOTE — Discharge Summary (Addendum)
Physician Discharge Summary  BECKA LAGASSE ZOX:[id number] DOB: 03/16/1917 DOA: 02/06/2012  PCP: [name], MD  Admit date: 02/06/2012 Discharge date: 02/09/2012  Time spent: Greater than 30 minutes  Recommendations for Outpatient Follow-up:  1. With Dr. Murray Hodgkins, PCP in 5 days from hospital discharge with repeat labs (CBC). Decision regarding resuming Coumadin to be made at that time.  Discharge Diagnoses:  Principal Problem:  *Rectal bleeding Active Problems:  HYPERTENSION, BENIGN  Atrial fibrillation  History of GI diverticular bleed  Acute blood loss anemia  Hypothyroidism  Mild cognitive impairment  Chronic anticoagulation  Diverticulosis of colon (without mention of hemorrhage)   Discharge Condition: Improved and stable.  Diet recommendation: Heart healthy diet.  Filed Weights   02/07/12 0524 02/08/12 0610 02/09/12 0625  Weight: 56.4 kg (124 lb 5.4 oz) 55.2 kg (121 lb 11.1 oz) 54.885 kg (121 lb)    History of present illness:  79 year old female with history of diverticular bleeding approximately 11 years ago, atrial fibrillation on chronic Coumadin, was admitted on 1/6 with history of bright red bleeding per rectum. She reports some abdominal cramping for 2 weeks. No nausea or vomiting. She was admitted for further evaluation and management.  Hospital Course:   Rectal bleeding/diverticular bleeding  Patient was initially managed conservatively by reversing her INR with vitamin K. Even though her INR came down from 2.71 to 1.5, patient continued to have bloody BMs. Gastroenterology was consulted and eventually performed colonoscopy on 1/9 which shows severe diverticular changes throughout left colon which is almost certainly the source of her recent bleeding and GI indicates that it is unlikely to cause bleeding again. The examination was otherwise normal and there were no polyps or cancers. They recommend restarting Coumadin in 5 days and have cleared her for  discharge back to NH today. Patient's family is agreeable to this plan.  Acute blood loss anemia on chronic anemia  Admitting hemoglobin was 11.5 which dropped down to 8.7-10 gm/dl range. This was secondary to rectal bleeding. This however stabilized and did not require blood transfusions. Recommend followup of CBC in 5 days.   Hypertension  Patient is probably a difficult to control hypertensive given the polypharmacy that she is on. Continue clonidine, metoprolol and enalapril. Fluctuating blood pressures.   Atrial fibrillation Controlled ventricular rate/sinus rhythm. Coumadin held secondary to rectal bleeding. Per GI recommendations, Coumadin may be resumed in 5 days. Continue digoxin and metoprolol.  Hypothyroidism  Continue Synthroid.  Dementia  Mental status at baseline.  DO NOT RESUSCITATE  Procedures: EGD 02/09/12: There were severe diverticular changes throughout left colon. This is almost certainly the source of her recent bleeding and it is unlikely to cause bleeding again. The examination was otherwise normal. No polyps or cancers  Consultations:  [place] [facility]: Dr. Christella Hartigan  Discharge Exam:  Complaints Patient denies complaints. No abdominal pain. No further rectal bleeding.  Filed Vitals:   02/09/12 1300 02/09/12 1315 02/09/12 1322 02/09/12 1341  BP: 120/56 137/68 142/54 144/55  Pulse:    70  Temp:    97.8 F (36.6 C)  TempSrc:    Axillary  Resp: 13 15 17 18   Height:      Weight:      SpO2: 95% 94% 95% 95%    General exam: Frail elderly female. Comfortable.  Respiratory system: Clear to auscultation. No increased work of breathing.  Crevasses system: First and second heart sounds heard, regular rate and rhythm. No JVD, murmurs or gallops.  Gastrointestinal system: Abdomen is  nondistended soft and nontender. Normal bowel sounds heard.  Central system: Alert and oriented to self and partly to place. No focal neurological deficits.  Extremities  symmetrical 5 x 5 Powers. Superficial cuts and bruises on legs  Discharge Instructions      Discharge Orders    Future Appointments: Provider: Department: Dept Phone: Center:   04/09/2012 8:15 AM [facility] Main Office [place]) [phone] [id number]     Future Orders Please Complete By Expires   Diet - low sodium heart healthy      Increase activity slowly      Call MD for:      Comments:   Rectal bleeding.       Medication List     As of 02/09/2012  2:52 PM    STOP taking these medications         warfarin 1 MG tablet   Commonly known as: COUMADIN      TAKE these medications         acetaminophen 500 MG tablet   Commonly known as: TYLENOL   Take 500 mg by mouth 2 (two) times daily.      ALIGN 4 MG Caps   Take 4 mg by mouth daily.      ARICEPT 10 MG tablet   Generic drug: donepezil   Take 10 mg by mouth at bedtime.      cloNIDine 0.2 MG tablet   Commonly known as: CATAPRES   Take 0.2 mg by mouth daily.      digoxin 0.125 MG tablet   Commonly known as: LANOXIN   Take 125 mcg by mouth daily.      enalapril 20 MG tablet   Commonly known as: VASOTEC   Take 20 mg by mouth 2 (two) times daily.      levothyroxine 125 MCG tablet   Commonly known as: SYNTHROID, LEVOTHROID   Take 125 mcg by mouth daily.      metoprolol succinate 100 MG 24 hr tablet   Commonly known as: TOPROL-XL   Take 100 mg by mouth 2 (two) times daily. Take with or immediately following a meal.      PreserVision/Lutein Caps   Take by mouth 2 (two) times daily.         Follow-up Information    Follow up with GREEN, Lenon Curt, MD. Schedule an appointment as soon as possible for a visit in 5 days. (With repeat labs (CBC) and decision regarding starting back Coumadin)    Contact information:   [address] Jeanella Anton [place] [address] [phone]           The results of significant diagnostics from this hospitalization (including imaging,  microbiology, ancillary and laboratory) are listed below for reference.    Significant Diagnostic Studies: No results found.  Microbiology: No results found for this or any previous visit (from the past 240 hour(s)).   Labs: Basic Metabolic Panel:  Lab 02/09/12 9629 02/08/12 0610 02/07/12 0457 02/06/12 0815  NA 144 142 140 138  K 3.8 3.5 3.9 4.4  CL 111 109 109 104  CO2 24 24 24 27   GLUCOSE 90 97 86 100*  BUN 16 15 21 19   CREATININE 0.92 0.90 0.90 0.98  CALCIUM 8.8 8.9 8.2* 8.9  MG -- -- -- --  PHOS -- -- -- --   Liver Function Tests:  Lab 02/07/12 0457  AST 11  ALT 5  ALKPHOS 65  BILITOT 0.4  PROT 5.1*  ALBUMIN  2.3*   No results found for this basename: LIPASE:5,AMYLASE:5 in the last 168 hours No results found for this basename: AMMONIA:5 in the last 168 hours CBC:  Lab 02/09/12 0540 02/08/12 1942 02/08/12 0610 02/07/12 2155 02/07/12 1330 02/06/12 0815  WBC 6.4 7.1 5.8 6.5 5.7 --  NEUTROABS -- -- -- -- -- 3.5  HGB 9.3* 10.0* 9.9* 8.9* 8.7* --  HCT 29.8* 31.6* 31.5* 27.5* 27.2* --  MCV 94.3 92.7 92.9 91.4 92.2 --  PLT 172 185 197 168 167 --   Cardiac Enzymes: No results found for this basename: CKTOTAL:5,CKMB:5,CKMBINDEX:5,TROPONINI:5 in the last 168 hours BNP: BNP (last 3 results) No results found for this basename: PROBNP:3 in the last 8760 hours CBG: No results found for this basename: GLUCAP:5 in the last 168 hours     Signed:  Wania Longstreth  [facility] 02/09/2012, 2:52 PM

## 2012-02-09 NOTE — Progress Notes (Signed)
Patient discharged to SNF, copies of all discharge medications and instructions sent with patient's family to facility. Patient to be transported by family.

## 2012-02-09 NOTE — Progress Notes (Signed)
Clinical Social Worker facilitated pt discharge needs to [facility] including contacting facility, faxing pt discharge information to [facility], discussing with pt and pt family at bedside, providing RN phone number to call report, pt daughter and son-in-law plan to provide transportation for pt back to [facility]. No further social work needs identified at this time. Clinical Social Worker signing off.   Jacklynn Lewis, MSW, LCSWA  Clinical Social Work [phone]

## 2012-02-09 NOTE — Progress Notes (Addendum)
Clinical Social Worker continuing to follow for discharge planning needs. Per MD, pt to have colonoscopy today and aim for discharge tomorrow as along as medically cleared. Pt is from [facility] SNF and plan is to return. Clinical Social Worker contacted the facility and left voice message to update. Discussed with pt family. Clinical Social Worker to continue to follow and facilitate pt discharge needs when pt medically stable for discharge.  Jacklynn Lewis, MSW, LCSWA  Clinical Social Work [phone]

## 2012-02-09 NOTE — Interval H&P Note (Signed)
History and Physical Interval Note:  02/09/2012 12:00 PM  [name]  has presented today for surgery, with the diagnosis of hematochezia  The various methods of treatment have been discussed with the patient and family. After consideration of risks, benefits and other options for treatment, the patient has consented to  Procedure(s) (LRB) with comments: COLONOSCOPY (N/A) as a surgical intervention .  The patient's history has been reviewed, patient examined, no change in status, stable for surgery.  I have reviewed the patient's chart and labs.  Questions were answered to the patient's satisfaction.     Rob Bunting

## 2012-02-09 NOTE — Progress Notes (Signed)
Clinical Social Worker received notification from RN that pt returned from procedure and MD feels pt medically ready for discharge today. Clinical Social Worker contacted [facility] who confirmed that pt could return today and facility would need MD to complete discharge information completed by 3:30pm. Clinical Social Worker notified RN who was notifying MD. Clinical Social Worker to facilitate pt discharge needs.  Jacklynn Lewis, MSW, LCSWA  Clinical Social Work [phone]

## 2012-02-09 NOTE — H&P (View-Only) (Signed)
[facility] Progress Note  SUBJECTIVE: Hungry  OBJECTIVE:  Vital signs in last 24 hours: Temp:  [97.6 F (36.4 C)-98.1 F (36.7 C)] 97.9 F (36.6 C) (01/08 0610) Pulse Rate:  [63-72] 63  (01/08 0610) Resp:  [16-18] 16  (01/08 0610) BP: (153-176)/(58-82) 154/82 mmHg (01/08 0610) SpO2:  [97 %-100 %] 99 % (01/08 0610) Weight:  [121 lb 11.1 oz (55.2 kg)] 121 lb 11.1 oz (55.2 kg) (01/08 0610) Last BM Date: 02/06/12 General:    white female in NAD Abdomen:  Soft, nontender and nondistended. Normal bowel sounds. Psych:  Cooperative. Normal mood and affect.   Lab Results:  Basename 02/08/12 0610 02/07/12 2155 02/07/12 1330  WBC 5.8 6.5 5.7  HGB 9.9* 8.9* 8.7*  HCT 31.5* 27.5* 27.2*  PLT 197 168 167   BMET  Basename 02/08/12 0610 02/07/12 0457 02/06/12 0815  NA 142 140 138  K 3.5 3.9 4.4  CL 109 109 104  CO2 24 24 27  GLUCOSE 97 86 100*  BUN 15 21 19  CREATININE 0.90 0.90 0.98  CALCIUM 8.9 8.2* 8.9   LFT  Basename 02/07/12 0457  PROT 5.1*  ALBUMIN 2.3*  AST 11  ALT 5  ALKPHOS 65  BILITOT 0.4  BILIDIR --  IBILI --   PT/INR  Basename 02/08/12 0855 02/07/12 1330  LABPROT 17.7* 21.9*  INR 1.50* 2.00*      ASSESSMENT / PLAN:  1. 79 year old female with hematochezia. Suspect diverticular hemorrhage. She has continued to pass some blood, last episode this am but hgb stable (actually rose overnight). Long conversation with family, plan is to proceed with a colonoscopy in am for evaluation (even if bleeding ceases) as patient hasn't had one in over 12 years.  2. Coagulopathy secondary to coumadin. INR down to 1.5 today. Coumadin on hold, s/p Vit K.      LOS: 2 days   Paula Guenther  02/08/2012, 10:49 AM   ________________________________________________________________________  [facility] MD note:  I personally examined the patient, reviewed the data and agree with the assessment and plan described above.   Roen Macgowan, MD [place]  [facility]astroenterology Pager [phone]  

## 2012-02-10 ENCOUNTER — Encounter (HOSPITAL_COMMUNITY): Payer: Self-pay | Admitting: Gastroenterology

## 2012-04-09 ENCOUNTER — Encounter: Payer: Self-pay | Admitting: Internal Medicine

## 2012-04-09 ENCOUNTER — Other Ambulatory Visit: Payer: Self-pay

## 2012-04-09 ENCOUNTER — Ambulatory Visit (INDEPENDENT_AMBULATORY_CARE_PROVIDER_SITE_OTHER): Payer: Medicare Other | Admitting: *Deleted

## 2012-04-09 DIAGNOSIS — Z950 Presence of cardiac pacemaker: Secondary | ICD-10-CM

## 2012-04-09 DIAGNOSIS — I4891 Unspecified atrial fibrillation: Secondary | ICD-10-CM

## 2012-04-09 LAB — REMOTE PACEMAKER DEVICE
BATTERY VOLTAGE: 2.96 V
BRDY-0002RV: 60 {beats}/min
BRDY-0005RV: 50 {beats}/min
DEVICE MODEL PM: 7124598
RV LEAD THRESHOLD: 1.75 V

## 2012-04-13 ENCOUNTER — Encounter: Payer: Self-pay | Admitting: *Deleted

## 2012-04-27 ENCOUNTER — Non-Acute Institutional Stay (SKILLED_NURSING_FACILITY): Payer: Medicare Other | Admitting: Nurse Practitioner

## 2012-04-27 DIAGNOSIS — R609 Edema, unspecified: Secondary | ICD-10-CM

## 2012-04-27 DIAGNOSIS — F32A Depression, unspecified: Secondary | ICD-10-CM | POA: Insufficient documentation

## 2012-04-27 DIAGNOSIS — E039 Hypothyroidism, unspecified: Secondary | ICD-10-CM

## 2012-04-27 DIAGNOSIS — I10 Essential (primary) hypertension: Secondary | ICD-10-CM

## 2012-04-27 DIAGNOSIS — I4891 Unspecified atrial fibrillation: Secondary | ICD-10-CM

## 2012-04-27 DIAGNOSIS — D62 Acute posthemorrhagic anemia: Secondary | ICD-10-CM

## 2012-04-27 DIAGNOSIS — G3184 Mild cognitive impairment, so stated: Secondary | ICD-10-CM

## 2012-04-27 DIAGNOSIS — Z7901 Long term (current) use of anticoagulants: Secondary | ICD-10-CM

## 2012-04-27 DIAGNOSIS — F329 Major depressive disorder, single episode, unspecified: Secondary | ICD-10-CM

## 2012-04-27 NOTE — Progress Notes (Signed)
Subjective:    Patient ID: [name], female    DOB: [birth year], 79 y.o.   MRN: [id number]  HPI  Staff reported the patient is unhappy, belligerent, rude, used to be on Lexapro 5mg  which seems helped to stabilize her mood.  Bp still mildly elevated 162/80--151/78, asymptomatic   Anemia: continue to improved since Fe, B12, and Folic acid started. Hgb 11.4 04/23/12  hx of GI diverticular bleed about 11 years ago(diverticulosis of colon without mention of hemorrhage)  244.9-HYPOTHYROIDISM The hypothyroidism is stable. The patient denies any symptoms of hypothyroidism such as dry skin, hoarseness, loss of energy, cold intolerance, or weight gain.  No complications noted from the medication presently being used.taking Levothyroxine and TSH 0.971 02/16/12  272.4-HYPERLIPIDEMIA The patient's most recent LDL is at goal.  290.21-SENILE DEMENTIA WITH DEPRESSIVE FEATURES The dementia remains stable and continues to function adequately in the current living environment with supervision.off Lexapro 5mg   331.0-ALZHEIMER'S The Alzheimer's disease has not changed. The patient has had little change in behavior.  No complications noted from the medication presently being used.  On Aricept.   401.9-HTN UNSPECIFIED The blood pressure readings taken outside the office since the last visit have not been controlled in the target range. The patient denies chest pain, shortness of breath, dyspnea on exertion, pedal edema, or headache. No complications noted from the medication presently being used.Metoprolol, Clonidine, Vasotec--midly elevated still.   427.31-AFIB The patient's atrial fibrillation has improved.No complications noted from the medication presently being used. Dig  715.90-OSTEOARTHRITIS The arthritis is stable.  The patient has mild joint pain.  782.3-EDEMA The edema has improved.  No complications noted from the medication presently being used.off Furosemide  V45.01-CARDIAC PACEMAKER IN SITU   Implant in June 2011 die to symptomatic bradycardia.   Review of Systems  Constitutional: Negative for fever, diaphoresis, appetite change, fatigue and unexpected weight change.  HENT: Positive for hearing loss. Negative for congestion, neck pain, neck stiffness and sinus pressure.   Eyes: Negative.   Respiratory: Positive for shortness of breath. Negative for cough, chest tightness and wheezing.   Cardiovascular: Positive for leg swelling. Negative for chest pain and palpitations.  Gastrointestinal: Negative.  Negative for abdominal pain, constipation, blood in stool and anal bleeding.  Endocrine:       Has been treated for hypothyroidism  Genitourinary: Positive for frequency. Negative for dysuria, urgency, flank pain and pelvic pain.  Musculoskeletal: Positive for back pain, arthralgias and gait problem (w/c).  Skin: Negative for rash and wound.       Venous dermatitis BLE  Allergic/Immunologic: Negative.   Neurological: Negative for tremors, facial asymmetry, speech difficulty, weakness, light-headedness, numbness and headaches.  Hematological: Bruises/bleeds easily (coumadin and anemai. ).  Psychiatric/Behavioral: Positive for behavioral problems, confusion and dysphoric mood. Negative for hallucinations and sleep disturbance. The patient is not nervous/anxious.        Objective:   Physical Exam  Constitutional: She is oriented to person, place, and time. She appears well-developed and well-nourished. No distress.  HENT:  Head: Normocephalic and atraumatic.  Eyes: Conjunctivae and EOM are normal. Pupils are equal, round, and reactive to light.  Neck: Normal range of motion. Neck supple. No JVD present. No thyromegaly present.  Cardiovascular: Normal rate, regular rhythm and normal heart sounds.   No murmur heard. Pulmonary/Chest: Effort normal. She has no wheezes. She has rales (bibasilar dry rales). She exhibits no tenderness.  Abdominal: Soft. Bowel sounds are normal. There is  no tenderness.  Musculoskeletal: Normal range of  motion. She exhibits no edema.  Lymphadenopathy:    She has no cervical adenopathy.  Neurological: She is alert and oriented to person, place, and time. She has normal reflexes. She displays normal reflexes. No cranial nerve deficit. She exhibits normal muscle tone. Coordination normal.  Skin: Skin is warm and dry. No rash noted. She is not diaphoretic. There is erythema (bilagteral lower legs chronic).  Psychiatric: Her speech is normal. Her affect is angry, blunt and inappropriate. She is aggressive. Thought content is not paranoid and not delusional. Cognition and memory are impaired. She expresses impulsivity and inappropriate judgment. She exhibits a depressed mood. She exhibits abnormal recent memory.          Assessment & Plan:   . HYPERTENSION, BENIGN Elevated SBP, continue Metoprolol, Vasotec, Clonidine.   . Atrial fibrillation Rate controlled, check Digoxin level.   Marland Kitchen EDEMA trace  . Pacemaker-st judes single chamber  Funtioning.   Marland Kitchen History of GI diverticular bleed x2 in the past 10-12 years.   . Rectal bleeding No further occurrence.   . Acute blood loss anemia Resolved, Hgb 11.4 04/23/12  . Hypothyroidism Update TSH  . Mild cognitive impairment Continue SNF for care and Aricept.   . Chronic anticoagulation Continue to adjust Coumadin dose per PT/INR  . Depression Will try Lexapro 5mg

## 2012-05-01 ENCOUNTER — Non-Acute Institutional Stay (SKILLED_NURSING_FACILITY): Payer: Medicare Other | Admitting: Nurse Practitioner

## 2012-05-01 DIAGNOSIS — I10 Essential (primary) hypertension: Secondary | ICD-10-CM

## 2012-05-01 DIAGNOSIS — I4891 Unspecified atrial fibrillation: Secondary | ICD-10-CM

## 2012-05-01 DIAGNOSIS — E039 Hypothyroidism, unspecified: Secondary | ICD-10-CM

## 2012-05-01 DIAGNOSIS — F329 Major depressive disorder, single episode, unspecified: Secondary | ICD-10-CM

## 2012-05-01 NOTE — Progress Notes (Signed)
Subjective:    Patient ID: [name], female    DOB: October 11, 1917, 79 y.o.   MRN: [id number]  HPI   Staff reported the patient is unhappy, belligerent, rude, Lexapro 5mg  resumed and too soon to eval.   Bp still mildly elevated 162/80--151/78, asymptomatic   Anemia: continue to improved since Fe, B12, and Folic acid started. Hgb 11.4 04/23/12  HYPOTHYROIDISM The hypothyroidism is stable. The patient denies any symptoms of hypothyroidism such as dry skin, hoarseness, loss of energy, cold intolerance, or weight gain.  No complications noted from the medication presently being used.taking Levothyroxine and TSH 0.971 02/16/12 and 0.172 04/30/12--Levothyroxine decreased to 05/01/12--f/u TSH in 12 weeks scheduled.   HYPERLIPIDEMIA The patient's most recent LDL is at goal.  SENILE DEMENTIA WITH DEPRESSIVE FEATURES The dementia remains stable and continues to function adequately in the current living environment with supervision.off Lexapro 5mg   ALZHEIMER'S The Alzheimer's disease has not changed. The patient has had little change in behavior.  No complications noted from the medication presently being used.  On Aricept.   HTN UNSPECIFIED The blood pressure readings taken outside the office since the last visit have not been controlled in the target range. The patient denies chest pain, shortness of breath, dyspnea on exertion, pedal edema, or headache. No complications noted from the medication presently being used.Metoprolol, Clonidine, Vasotec--midly elevated still.   AFIB The patient's atrial fibrillation has improved.No complications noted from the medication presently being used. Dig  OSTEOARTHRITIS The arthritis is stable.  The patient has mild joint pain.  EDEMA The edema has improved.  No complications noted from the medication presently being used.off Furosemide  CARDIAC PACEMAKER IN SITU  Implant in June 2011 die to symptomatic bradycardia   Review of Systems  Constitutional:  Negative for fever, diaphoresis, appetite change, fatigue and unexpected weight change.  HENT: Positive for hearing loss. Negative for congestion, neck pain, neck stiffness and sinus pressure.   Eyes: Negative.   Respiratory: Positive for shortness of breath. Negative for cough, chest tightness and wheezing.   Cardiovascular: Positive for leg swelling. Negative for chest pain and palpitations.  Gastrointestinal: Negative.  Negative for abdominal pain, constipation, blood in stool and anal bleeding.  Endocrine:       Has been treated for hypothyroidism  Genitourinary: Positive for frequency. Negative for dysuria, urgency, flank pain and pelvic pain.  Musculoskeletal: Positive for back pain, arthralgias and gait problem (w/c).  Skin: Negative for rash and wound.       Venous dermatitis BLE  Allergic/Immunologic: Negative.   Neurological: Negative for tremors, facial asymmetry, speech difficulty, weakness, light-headedness, numbness and headaches.  Hematological: Bruises/bleeds easily (coumadin and anemai. ).  Psychiatric/Behavioral: Positive for behavioral problems, confusion and dysphoric mood. Negative for hallucinations and sleep disturbance. The patient is not nervous/anxious.        Objective:   Physical Exam  Constitutional: She is oriented to person, place, and time. She appears well-developed and well-nourished. No distress.  HENT:  Head: Normocephalic and atraumatic.  Eyes: Conjunctivae and EOM are normal. Pupils are equal, round, and reactive to light.  Neck: Normal range of motion. Neck supple. No JVD present. No thyromegaly present.  Cardiovascular: Normal rate, regular rhythm and normal heart sounds.   No murmur heard. Pulmonary/Chest: Effort normal. She has no wheezes. She has rales (bibasilar dry rales). She exhibits no tenderness.  Abdominal: Soft. Bowel sounds are normal. There is no tenderness.  Musculoskeletal: Normal range of motion. She exhibits no edema.  Lymphadenopathy:    She has no cervical adenopathy.  Neurological: She is alert and oriented to person, place, and time. She has normal reflexes. No cranial nerve deficit. She exhibits normal muscle tone. Coordination normal.  Skin: Skin is warm and dry. No rash noted. She is not diaphoretic. There is erythema (bilagteral lower legs chronic).  Psychiatric: Her speech is normal. Her affect is angry, blunt and inappropriate. She is aggressive. Thought content is not paranoid and not delusional. Cognition and memory are impaired. She expresses impulsivity and inappropriate judgment. She exhibits a depressed mood. She exhibits abnormal recent memory.          Assessment & Plan:   . HYPERTENSION, BENIGN Elevated SBP, continue Metoprolol, Vasotec, Clonidine.   . Atrial fibrillation Rate controlled, continue digoxin  . EDEMA trace  . Pacemaker-st judes single chamber  Funtioning.   Marland Kitchen History of GI diverticular bleed x2 in the past 10-12 years.    . Acute blood loss anemia Resolved  . Hypothyroidism Decrease Levothyroxine to , f/u TSH in 12 weeks.   . Mild cognitive impairment Continue SNF for care and Aricept.   . Chronic anticoagulation Continue to adjust Coumadin dose per PT/INR  . Depression Continue  Lexapro 5mg

## 2012-06-01 ENCOUNTER — Non-Acute Institutional Stay (SKILLED_NURSING_FACILITY): Payer: Medicare Other | Admitting: Nurse Practitioner

## 2012-06-01 DIAGNOSIS — G3184 Mild cognitive impairment, so stated: Secondary | ICD-10-CM

## 2012-06-01 DIAGNOSIS — R609 Edema, unspecified: Secondary | ICD-10-CM

## 2012-06-01 DIAGNOSIS — D62 Acute posthemorrhagic anemia: Secondary | ICD-10-CM

## 2012-06-01 DIAGNOSIS — I10 Essential (primary) hypertension: Secondary | ICD-10-CM

## 2012-06-01 DIAGNOSIS — F329 Major depressive disorder, single episode, unspecified: Secondary | ICD-10-CM

## 2012-06-01 DIAGNOSIS — I4891 Unspecified atrial fibrillation: Secondary | ICD-10-CM

## 2012-06-01 DIAGNOSIS — E039 Hypothyroidism, unspecified: Secondary | ICD-10-CM

## 2012-06-01 NOTE — Assessment & Plan Note (Signed)
Sad facial looks, takes Lexapro 5mg                        

## 2012-06-01 NOTE — Assessment & Plan Note (Addendum)
Rate controlled, takes Dig and Coumadin                 

## 2012-06-01 NOTE — Assessment & Plan Note (Signed)
Worse, LLE>RLE, Venous Doppler of the LLE to r/o DVT, BNP and BMP to evaluate, weight daily, Furosemide 20mg  daily for now.

## 2012-06-01 NOTE — Assessment & Plan Note (Signed)
Chronic elevated SBP, takes Clonidine 0.2mg , Enalapril 20mg , Metoprolol 100mg 

## 2012-06-01 NOTE — Assessment & Plan Note (Signed)
Levothyroxine since 05/01/12-f/u TSH scheduled

## 2012-06-01 NOTE — Progress Notes (Signed)
Patient ID: [name], female   DOB: 1917/09/15, 79 y.o.   MRN: [id number]  Chief Complaint:  Chief Complaint  Patient presents with  . Medical Managment of Chronic Issues    leg edema L>R     HPI:    Problem List Items Addressed This Visit     ICD-9-CM   HYPERTENSION, BENIGN     Chronic elevated SBP, takes Clonidine 0.2mg , Enalapril 20mg , Metoprolol 100mg     Atrial fibrillation     Rate controlled, takes Dig and Coumadin    Acute blood loss anemia     Stabilized with Hgb 10s, takes Fe, B12, Folate.     Hypothyroidism     Levothyroxine since 05/01/12-f/u TSH scheduled     Mild cognitive impairment     Resides at SNF, takes Aricept, progressed rapidly in the past year.     Depression     Sad facial looks, takes Lexapro 5mg      Edema - Primary (Chronic)     Worse, LLE>RLE, Venous Doppler of the LLE to r/o DVT, BNP and BMP to evaluate, weight daily, Furosemide 20mg  daily for now.        Review of Systems:  Review of Systems  Constitutional: Positive for malaise/fatigue. Negative for fever, chills, weight loss and diaphoresis.  HENT: Positive for hearing loss. Negative for congestion, sore throat, neck pain and ear discharge.   Eyes: Negative for pain, discharge and redness.  Respiratory: Negative for cough, sputum production and wheezing.   Cardiovascular: Positive for leg swelling and PND. Negative for chest pain, palpitations, orthopnea and claudication.  Gastrointestinal: Negative for heartburn, nausea, vomiting, abdominal pain, diarrhea, constipation and blood in stool.  Genitourinary: Positive for frequency (incontinent of bladder). Negative for dysuria, urgency, hematuria and flank pain.  Musculoskeletal: Positive for joint pain. Negative for myalgias, back pain and falls.  Skin: Negative for itching and rash.       Chronic BLE venous insufficiency  Neurological: Positive for weakness (generalized). Negative for dizziness, tingling, tremors, sensory  change, speech change, focal weakness, seizures and loss of consciousness.  Endo/Heme/Allergies: Negative for environmental allergies and polydipsia. Bruises/bleeds easily.  Psychiatric/Behavioral: Positive for depression and memory loss. Negative for suicidal ideas, hallucinations and substance abuse. The patient is not nervous/anxious and does not have insomnia.      Medications: Patient's Medications  New Prescriptions   No medications on file  Previous Medications   ACETAMINOPHEN (TYLENOL) 500 MG TABLET    Take 500 mg by mouth 2 (two) times daily.    CLONIDINE (CATAPRES) 0.2 MG TABLET    Take 0.2 mg by mouth daily.   DIGOXIN (LANOXIN) 0.125 MG TABLET    Take 125 mcg by mouth daily.     DONEPEZIL (ARICEPT) 10 MG TABLET    Take 10 mg by mouth at bedtime.    ENALAPRIL (VASOTEC) 20 MG TABLET    Take 20 mg by mouth 2 (two) times daily.     LEVOTHYROXINE (SYNTHROID, LEVOTHROID) 125 MCG TABLET    Take 125 mcg by mouth daily.   METOPROLOL SUCCINATE (TOPROL-XL) 100 MG 24 HR TABLET    Take 100 mg by mouth 2 (two) times daily. Take with or immediately following a meal.   MULTIPLE VITAMINS-MINERALS (PRESERVISION/LUTEIN) CAPS    Take by mouth 2 (two) times daily.     PROBIOTIC PRODUCT (ALIGN) 4 MG CAPS    Take 4 mg by mouth daily.   Modified Medications   No medications on file  Discontinued  Medications   No medications on file     Physical Exam: Physical Exam  Constitutional: She is oriented to person, place, and time. She appears well-developed and well-nourished. No distress.  HENT:  Head: Normocephalic and atraumatic.  Eyes: Conjunctivae and EOM are normal. Pupils are equal, round, and reactive to light.  Neck: Normal range of motion. Neck supple. No JVD present. No thyromegaly present.  Cardiovascular: Normal rate, regular rhythm and normal heart sounds.   No murmur heard. Pulmonary/Chest: Effort normal. She has no wheezes. She has rales (bibasilar dry rales). She exhibits no  tenderness.  Abdominal: Soft. Bowel sounds are normal. There is no tenderness.  Musculoskeletal: Normal range of motion. She exhibits edema (BLE. L>R).  Lymphadenopathy:    She has no cervical adenopathy.  Neurological: She is alert and oriented to person, place, and time. She has normal reflexes. No cranial nerve deficit. She exhibits normal muscle tone. Coordination normal.  Skin: Skin is warm and dry. No rash noted. She is not diaphoretic. There is erythema (bilagteral lower legs chronic).  Psychiatric: Her speech is normal. Her affect is angry, blunt and inappropriate. She is aggressive. Thought content is not paranoid and not delusional. Cognition and memory are impaired. She expresses impulsivity and inappropriate judgment. She exhibits a depressed mood. She exhibits abnormal recent memory.     Filed Vitals:   06/01/12 1000  BP: 160/70  Pulse: 68  Temp: 98.2 F (36.8 C)  TempSrc: Tympanic  Resp: 20  SpO2: 97%      Labs reviewed: Basic Metabolic Panel:  Recent Labs  16/10/96 0457 02/08/12 0610 02/09/12 0500  NA 140 142 144  K 3.9 3.5 3.8  CL 109 109 111  CO2 24 24 24   GLUCOSE 86 97 90  BUN 21 15 16   CREATININE 0.90 0.90 0.92  CALCIUM 8.2* 8.9 8.8    Liver Function Tests:  Recent Labs  02/07/12 0457  AST 11  ALT 5  ALKPHOS 65  BILITOT 0.4  PROT 5.1*  ALBUMIN 2.3*    CBC:  Recent Labs  02/06/12 0815  02/08/12 0610 02/08/12 1942 02/09/12 0540  WBC 5.7  < > 5.8 7.1 6.4  NEUTROABS 3.5  --   --   --   --   HGB 11.5*  < > 9.9* 10.0* 9.3*  HCT 35.7*  < > 31.5* 31.6* 29.8*  MCV 91.5  < > 92.9 92.7 94.3  PLT 196  < > 197 185 172  < > = values in this interval not displayed.  Anemia Panel: No results found for this basename: IRON, FOLATE, VITAMINB12,  in the last 8760 hours  Significant Diagnostic Results:     Assessment/Plan Edema Worse, LLE>RLE, Venous Doppler of the LLE to r/o DVT, BNP and BMP to evaluate, weight daily, Furosemide 20mg   daily for now.   HYPERTENSION, BENIGN Chronic elevated SBP, takes Clonidine 0.2mg , Enalapril 20mg , Metoprolol 100mg   Atrial fibrillation Rate controlled, takes Dig and Coumadin  Acute blood loss anemia Stabilized with Hgb 10s, takes Fe, B12, Folate.   Hypothyroidism Levothyroxine since 05/01/12-f/u TSH scheduled   Depression Sad facial looks, takes Lexapro 5mg    Mild cognitive impairment Resides at SNF, takes Aricept, progressed rapidly in the past year.      Family/ staff Communication: monitor edema and weights   Goals of care: SNF   Labs/tests ordered BMP, BNP, Venous Doppler LLE

## 2012-06-01 NOTE — Assessment & Plan Note (Signed)
Resides at SNF, takes Aricept, progressed rapidly in the past year.    

## 2012-06-01 NOTE — Assessment & Plan Note (Signed)
Stabilized with Hgb 10s, takes Fe, B12, Folate.

## 2012-06-04 LAB — BASIC METABOLIC PANEL: Creatinine: 1.1 mg/dL (ref 0.5–1.1)

## 2012-06-11 LAB — CBC AND DIFFERENTIAL
Hemoglobin: 11.6 g/dL — AB (ref 12.0–16.0)
Platelets: 141 10*3/uL — AB (ref 150–399)
WBC: 4.4 10^3/mL

## 2012-06-15 ENCOUNTER — Non-Acute Institutional Stay (SKILLED_NURSING_FACILITY): Payer: Medicare Other | Admitting: Nurse Practitioner

## 2012-06-15 DIAGNOSIS — F329 Major depressive disorder, single episode, unspecified: Secondary | ICD-10-CM

## 2012-06-15 DIAGNOSIS — I4891 Unspecified atrial fibrillation: Secondary | ICD-10-CM

## 2012-06-15 DIAGNOSIS — E039 Hypothyroidism, unspecified: Secondary | ICD-10-CM

## 2012-06-15 DIAGNOSIS — I10 Essential (primary) hypertension: Secondary | ICD-10-CM

## 2012-06-15 DIAGNOSIS — G3184 Mild cognitive impairment, so stated: Secondary | ICD-10-CM

## 2012-06-15 DIAGNOSIS — D62 Acute posthemorrhagic anemia: Secondary | ICD-10-CM

## 2012-06-15 DIAGNOSIS — R609 Edema, unspecified: Secondary | ICD-10-CM

## 2012-06-15 NOTE — Assessment & Plan Note (Signed)
Sad facial looks, takes Lexapro 5mg                        

## 2012-06-15 NOTE — Assessment & Plan Note (Signed)
Resides at SNF, takes Aricept, progressed rapidly in the past year.    

## 2012-06-15 NOTE — Assessment & Plan Note (Addendum)
Stabilized with Hgb 11s, takes Fe, B12, Folate.

## 2012-06-15 NOTE — Assessment & Plan Note (Signed)
Levothyroxine 100mcg since 05/01/12-f/u TSH scheduled  

## 2012-06-15 NOTE — Progress Notes (Signed)
Patient ID: [name], female   DOB: 03-11-1917, 79 y.o.   MRN: [id number]  Chief Complaint:  Chief Complaint  Patient presents with  . Medical Managment of Chronic Issues    ear wax, edema LLE>RLE     HPI:   Problem List Items Addressed This Visit   HYPERTENSION, BENIGN     Controlled,  takes Clonidine 0.2mg , Enalapril 20mg , Metoprolol 100mg       Atrial fibrillation     Rate controlled, takes Dig and Coumadin      Acute blood loss anemia     Stabilized with Hgb 11s, takes Fe, B12, Folate.       Hypothyroidism     Levothyroxine since 05/01/12-f/u TSH scheduled       Mild cognitive impairment     Resides at SNF, takes Aricept, progressed rapidly in the past year.       Depression     Sad facial looks, takes Lexapro 5mg        Edema - Primary (Chronic)     Improved slightly,  LLE>RLE, Venous Doppler of the LLE to 06/01/12 ruled out DVT, BNP 404.3 06/04/12  Furosemide 20mg  daily since 06/01/12         Review of Systems:  Review of Systems  Constitutional: Positive for malaise/fatigue. Negative for fever, chills, weight loss and diaphoresis.  HENT: Negative for congestion, sore throat, neck pain and ear discharge. Hearing loss: ear wax removed manually.    Eyes: Negative for pain, discharge and redness.  Respiratory: Negative for cough, sputum production and wheezing.   Cardiovascular: Positive for leg swelling and PND. Negative for chest pain, palpitations, orthopnea and claudication.  Gastrointestinal: Negative for heartburn, nausea, vomiting, abdominal pain, diarrhea, constipation and blood in stool.  Genitourinary: Positive for frequency (incontinent of bladder). Negative for dysuria, urgency, hematuria and flank pain.  Musculoskeletal: Positive for joint pain. Negative for myalgias, back pain and falls.  Skin: Negative for itching and rash.       Chronic BLE venous insufficiency  Neurological: Positive for weakness (generalized). Negative for  dizziness, tingling, tremors, sensory change, speech change, focal weakness, seizures and loss of consciousness.  Endo/Heme/Allergies: Negative for environmental allergies and polydipsia. Bruises/bleeds easily.  Psychiatric/Behavioral: Positive for depression and memory loss. Negative for suicidal ideas, hallucinations and substance abuse. The patient is not nervous/anxious and does not have insomnia.      Medications: Patient's Medications  New Prescriptions   No medications on file  Previous Medications   ACETAMINOPHEN (TYLENOL) 500 MG TABLET    Take 500 mg by mouth 2 (two) times daily.    CLONIDINE (CATAPRES) 0.2 MG TABLET    Take 0.2 mg by mouth daily.   DIGOXIN (LANOXIN) 0.125 MG TABLET    Take 125 mcg by mouth daily.     DONEPEZIL (ARICEPT) 10 MG TABLET    Take 10 mg by mouth at bedtime.    ENALAPRIL (VASOTEC) 20 MG TABLET    Take 20 mg by mouth 2 (two) times daily.     LEVOTHYROXINE (SYNTHROID, LEVOTHROID) 125 MCG TABLET    Take 125 mcg by mouth daily.   METOPROLOL SUCCINATE (TOPROL-XL) 100 MG 24 HR TABLET    Take 100 mg by mouth 2 (two) times daily. Take with or immediately following a meal.   MULTIPLE VITAMINS-MINERALS (PRESERVISION/LUTEIN) CAPS    Take by mouth 2 (two) times daily.     PROBIOTIC PRODUCT (ALIGN) 4 MG CAPS    Take 4 mg by mouth daily.  Modified Medications   No medications on file  Discontinued Medications   No medications on file     Physical Exam: Physical Exam  Constitutional: She is oriented to person, place, and time. She appears well-developed and well-nourished. No distress.  HENT:  Head: Normocephalic and atraumatic.  Right Ear: Tympanic membrane normal. No tenderness. No mastoid tenderness. Decreased hearing is noted.  Left Ear: Tympanic membrane normal. No tenderness. No mastoid tenderness. Decreased hearing is noted.  Impacted ear wax removed with curette.   Eyes: Conjunctivae and EOM are normal. Pupils are equal, round, and reactive to light.   Neck: Normal range of motion. Neck supple. No JVD present. No thyromegaly present.  Cardiovascular: Normal rate and regular rhythm.   Murmur heard.  Systolic murmur is present with a grade of 2/6  Pulmonary/Chest: Effort normal. She has no wheezes. She has rales (bibasilar dry rales). She exhibits no tenderness.  Abdominal: Soft. Bowel sounds are normal. There is no tenderness.  Musculoskeletal: Normal range of motion. She exhibits edema (BLE. L>R).  Lymphadenopathy:    She has no cervical adenopathy.  Neurological: She is alert and oriented to person, place, and time. She has normal reflexes. No cranial nerve deficit. She exhibits normal muscle tone. Coordination normal.  Skin: Skin is warm and dry. No rash noted. She is not diaphoretic. There is erythema (bilagteral lower legs chronic).  Psychiatric: Her speech is normal. Her affect is angry, blunt and inappropriate. She is aggressive. Thought content is not paranoid and not delusional. Cognition and memory are impaired. She expresses impulsivity and inappropriate judgment. She exhibits a depressed mood. She exhibits abnormal recent memory.     Filed Vitals:   06/15/12 1316  BP: 106/68  Pulse: 60  Temp: 96 F (35.6 C)  TempSrc: Tympanic  Resp: 18      Labs reviewed:  Digoxin level:  04/30/12 1.4  BNP   06/04/12 404.3  Basic Metabolic Panel:  Recent Labs  16/10/96 0457 02/08/12 0610 02/09/12 0500  NA 140 142 144  K 3.9 3.5 3.8  CL 109 109 111  CO2 24 24 24   GLUCOSE 86 97 90  BUN 21 15 16   CREATININE 0.90 0.90 0.92  CALCIUM 8.2* 8.9 8.8    Liver Function Tests:  Recent Labs  02/07/12 0457  AST 11  ALT 5  ALKPHOS 65  BILITOT 0.4  PROT 5.1*  ALBUMIN 2.3*    CBC:  Recent Labs  02/06/12 0815  02/08/12 0610 02/08/12 1942 02/09/12 0540 06/11/12  WBC 5.7  < > 5.8 7.1 6.4 4.4  NEUTROABS 3.5  --   --   --   --   --   HGB 11.5*  < > 9.9* 10.0* 9.3* 11.6*  HCT 35.7*  < > 31.5* 31.6* 29.8* 36  MCV  91.5  < > 92.9 92.7 94.3  --   PLT 196  < > 197 185 172 141*  < > = values in this interval not displayed.  Anemia Panel: No results found for this basename: IRON, FOLATE, VITAMINB12,  in the last 8760 hours  Significant Diagnostic Results:   06/01/12 LLE venous Doppler: no evidence of deep thrombosis   Assessment/Plan Edema Improved slightly,  LLE>RLE, Venous Doppler of the LLE to 06/01/12 ruled out DVT, BNP 404.3 06/04/12  Furosemide 20mg  daily since 06/01/12    Hypothyroidism Levothyroxine since 05/01/12-f/u TSH scheduled     HYPERTENSION, BENIGN Controlled,  takes Clonidine 0.2mg , Enalapril 20mg , Metoprolol 100mg     Atrial  fibrillation Rate controlled, takes Dig and Coumadin    Depression Sad facial looks, takes Lexapro 5mg      Acute blood loss anemia Stabilized with Hgb 11s, takes Fe, B12, Folate.     Mild cognitive impairment Resides at SNF, takes Aricept, progressed rapidly in the past year.         Family/ staff Communication: SNF   Goals of care: SNF   Labs/tests ordered none

## 2012-06-15 NOTE — Assessment & Plan Note (Signed)
Improved slightly,  LLE>RLE, Venous Doppler of the LLE to 06/01/12 ruled out DVT, BNP 404.3 06/04/12  Furosemide 20mg  daily since 06/01/12

## 2012-06-15 NOTE — Assessment & Plan Note (Signed)
Controlled,  takes Clonidine 0.2mg , Enalapril 20mg , Metoprolol 100mg 

## 2012-06-15 NOTE — Assessment & Plan Note (Signed)
Rate controlled, takes Dig and Coumadin

## 2012-06-22 ENCOUNTER — Non-Acute Institutional Stay (SKILLED_NURSING_FACILITY): Payer: Medicare Other | Admitting: Nurse Practitioner

## 2012-06-22 DIAGNOSIS — Z7901 Long term (current) use of anticoagulants: Secondary | ICD-10-CM

## 2012-06-22 DIAGNOSIS — J209 Acute bronchitis, unspecified: Secondary | ICD-10-CM | POA: Insufficient documentation

## 2012-06-22 DIAGNOSIS — I4891 Unspecified atrial fibrillation: Secondary | ICD-10-CM

## 2012-06-22 DIAGNOSIS — R609 Edema, unspecified: Secondary | ICD-10-CM

## 2012-06-22 DIAGNOSIS — I509 Heart failure, unspecified: Secondary | ICD-10-CM

## 2012-06-22 NOTE — Assessment & Plan Note (Signed)
Rate controlled, takes Dig and Coumadin                 

## 2012-06-22 NOTE — Assessment & Plan Note (Signed)
PT/INR next Monday.  

## 2012-06-22 NOTE — Assessment & Plan Note (Signed)
BNP 404.3 06/04/12--takes Furosemide 20mg  since 06/01/12.  Congestive cough noted. She is afebrile and no O2 desaturation. CXR to evaluate further.

## 2012-06-22 NOTE — Progress Notes (Signed)
Patient ID: [name], female   DOB: 07/23/1917, 79 y.o.   MRN: [id number]  Chief Complaint:  Chief Complaint  Patient presents with  . Medical Managment of Chronic Issues    congestive cough     HPI:   Problem List Items Addressed This Visit   Edema (Chronic)     Still elevated, increase Clonidine to  0.3mg , and continue Enalapril 20mg , Metoprolol 100mg         Atrial fibrillation     Rate controlled, takes Dig and Coumadin        Chronic anticoagulation     PT/INR next Monday    CHF (congestive heart failure) - Primary     BNP 404.3 06/04/12--takes Furosemide 20mg  since 06/01/12.  Congestive cough noted. She is afebrile and no O2 desaturation. CXR to evaluate further.     Acute bronchitis     CXR to r/o PNA and CHF. Augmentin 875mg  bid and Mucinex bid for 7 days.        Review of Systems:  Review of Systems  Constitutional: Positive for malaise/fatigue. Negative for fever, chills, weight loss and diaphoresis.  HENT: Negative for congestion, sore throat, neck pain and ear discharge. Hearing loss: ear wax removed manually.    Eyes: Negative for pain, discharge and redness.  Respiratory: Positive for cough. Negative for sputum production and wheezing.   Cardiovascular: Positive for leg swelling (better in the LLE) and PND. Negative for chest pain, palpitations, orthopnea and claudication.  Gastrointestinal: Negative for heartburn, nausea, vomiting, abdominal pain, diarrhea, constipation and blood in stool.  Genitourinary: Positive for frequency (incontinent of bladder). Negative for dysuria, urgency, hematuria and flank pain.  Musculoskeletal: Positive for joint pain. Negative for myalgias, back pain and falls.  Skin: Negative for itching and rash.       Chronic BLE venous insufficiency  Neurological: Positive for weakness (generalized). Negative for dizziness, tingling, tremors, sensory change, speech change, focal weakness, seizures and loss of consciousness.   Endo/Heme/Allergies: Negative for environmental allergies and polydipsia. Bruises/bleeds easily.  Psychiatric/Behavioral: Positive for depression and memory loss. Negative for suicidal ideas, hallucinations and substance abuse. The patient is not nervous/anxious and does not have insomnia.      Medications: Patient's Medications  New Prescriptions   No medications on file  Previous Medications   ACETAMINOPHEN (TYLENOL) 500 MG TABLET    Take 500 mg by mouth 2 (two) times daily.    CLONIDINE (CATAPRES) 0.2 MG TABLET    Take 0.2 mg by mouth daily.   DIGOXIN (LANOXIN) 0.125 MG TABLET    Take 125 mcg by mouth daily.     DONEPEZIL (ARICEPT) 10 MG TABLET    Take 10 mg by mouth at bedtime.    ENALAPRIL (VASOTEC) 20 MG TABLET    Take 20 mg by mouth 2 (two) times daily.     LEVOTHYROXINE (SYNTHROID, LEVOTHROID) 125 MCG TABLET    Take 125 mcg by mouth daily.   METOPROLOL SUCCINATE (TOPROL-XL) 100 MG 24 HR TABLET    Take 100 mg by mouth 2 (two) times daily. Take with or immediately following a meal.   MULTIPLE VITAMINS-MINERALS (PRESERVISION/LUTEIN) CAPS    Take by mouth 2 (two) times daily.     PROBIOTIC PRODUCT (ALIGN) 4 MG CAPS    Take 4 mg by mouth daily.   Modified Medications   No medications on file  Discontinued Medications   No medications on file     Physical Exam: Physical Exam  Constitutional: She is oriented  to person, place, and time. She appears well-developed and well-nourished. No distress.  HENT:  Head: Normocephalic and atraumatic.  Right Ear: Tympanic membrane normal. No tenderness. No mastoid tenderness. Decreased hearing is noted.  Left Ear: Tympanic membrane normal. No tenderness. No mastoid tenderness. Decreased hearing is noted.  Impacted ear wax removed with curette.   Eyes: Conjunctivae and EOM are normal. Pupils are equal, round, and reactive to light.  Neck: Normal range of motion. Neck supple. No JVD present. No thyromegaly present.  Cardiovascular: Normal  rate.  An irregular rhythm present.  Murmur heard.  Systolic murmur is present with a grade of 2/6  Left upper chest pacemaker.   Pulmonary/Chest: Effort normal. She has no wheezes. She has rales (bibasilar dry rales). She exhibits no tenderness.  Abdominal: Soft. Bowel sounds are normal. There is no tenderness.  Musculoskeletal: Normal range of motion. She exhibits edema (BLE. L>R).  Lymphadenopathy:    She has no cervical adenopathy.  Neurological: She is alert and oriented to person, place, and time. She has normal reflexes. No cranial nerve deficit. She exhibits normal muscle tone. Coordination normal.  Skin: Skin is warm and dry. No rash noted. She is not diaphoretic. There is erythema (bilagteral lower legs chronic).  Psychiatric: Her speech is normal. Her affect is angry, blunt and inappropriate. She is aggressive. Thought content is not paranoid and not delusional. Cognition and memory are impaired. She expresses impulsivity and inappropriate judgment. She exhibits a depressed mood. She exhibits abnormal recent memory.     Filed Vitals:   06/22/12 1103  BP: 174/82  Pulse: 72  Temp: 99.4 F (37.4 C)  TempSrc: Tympanic  Resp: 18  SpO2: 94%      Labs reviewed: Basic Metabolic Panel:  Recent Labs  40/98/11 0457 02/08/12 0610 02/09/12 0500 06/04/12  NA 140 142 144 141  K 3.9 3.5 3.8 4.1  CL 109 109 111  --   CO2 24 24 24   --   GLUCOSE 86 97 90  --   BUN 21 15 16 15   CREATININE 0.90 0.90 0.92 1.1  CALCIUM 8.2* 8.9 8.8  --     Liver Function Tests:  Recent Labs  02/07/12 0457  AST 11  ALT 5  ALKPHOS 65  BILITOT 0.4  PROT 5.1*  ALBUMIN 2.3*    CBC:  Recent Labs  02/06/12 0815  02/08/12 0610 02/08/12 1942 02/09/12 0540 06/11/12  WBC 5.7  < > 5.8 7.1 6.4 4.4  NEUTROABS 3.5  --   --   --   --   --   HGB 11.5*  < > 9.9* 10.0* 9.3* 11.6*  HCT 35.7*  < > 31.5* 31.6* 29.8* 36  MCV 91.5  < > 92.9 92.7 94.3  --   PLT 196  < > 197 185 172 141*  < > =  values in this interval not displayed.  Anemia Panel: No results found for this basename: IRON, FOLATE, VITAMINB12,  in the last 8760 hours  Significant Diagnostic Results:     Assessment/Plan CHF (congestive heart failure) BNP 404.3 06/04/12--takes Furosemide 20mg  since 06/01/12.  Congestive cough noted. She is afebrile and no O2 desaturation. CXR to evaluate further.   Acute bronchitis CXR to r/o PNA and CHF. Augmentin 875mg  bid and Mucinex bid for 7 days.   Chronic anticoagulation PT/INR next Monday  Atrial fibrillation Rate controlled, takes Dig and Coumadin      Edema Still elevated, increase Clonidine to  0.3mg , and continue Enalapril 20mg ,  Metoprolol 100mg           Family/ staff Communication: monitor Bp, cough, edema   Goals of care: SNF   Labs/tests ordered BMP in one week.

## 2012-06-22 NOTE — Assessment & Plan Note (Signed)
CXR to r/o PNA and CHF. Augmentin 875mg  bid and Mucinex bid for 7 days.

## 2012-06-22 NOTE — Assessment & Plan Note (Signed)
Still elevated, increase Clonidine to  0.3mg , and continue Enalapril 20mg , Metoprolol 100mg 

## 2012-06-26 LAB — BASIC METABOLIC PANEL
BUN: 10 mg/dL (ref 4–21)
Glucose: 94 mg/dL

## 2012-06-26 LAB — CBC AND DIFFERENTIAL
HCT: 41 % (ref 36–46)
Platelets: 227 10*3/uL (ref 150–399)

## 2012-06-29 ENCOUNTER — Non-Acute Institutional Stay (SKILLED_NURSING_FACILITY): Payer: Medicare Other | Admitting: Nurse Practitioner

## 2012-06-29 DIAGNOSIS — E039 Hypothyroidism, unspecified: Secondary | ICD-10-CM

## 2012-06-29 DIAGNOSIS — I10 Essential (primary) hypertension: Secondary | ICD-10-CM

## 2012-06-29 DIAGNOSIS — I4891 Unspecified atrial fibrillation: Secondary | ICD-10-CM

## 2012-06-29 DIAGNOSIS — R1310 Dysphagia, unspecified: Secondary | ICD-10-CM | POA: Insufficient documentation

## 2012-06-29 DIAGNOSIS — R609 Edema, unspecified: Secondary | ICD-10-CM

## 2012-06-29 DIAGNOSIS — J209 Acute bronchitis, unspecified: Secondary | ICD-10-CM

## 2012-06-29 DIAGNOSIS — D62 Acute posthemorrhagic anemia: Secondary | ICD-10-CM

## 2012-06-29 NOTE — Assessment & Plan Note (Signed)
Low measurements noted,  Will decrease Clonidine to 0.2mg , continue  Enalapril 20mg  bid, and switch Metoprolol XL 100mg  bid to tartrate bid due to pill dysphagia.

## 2012-06-29 NOTE — Assessment & Plan Note (Signed)
Levothyroxine 100mcg since 05/01/12-f/u TSH scheduled  

## 2012-06-29 NOTE — Progress Notes (Signed)
Patient ID: [name], female   DOB: 01/13/1918, 79 y.o.   MRN: [id number]  Chief Complaint:  Chief Complaint  Patient presents with  . Medical Managment of Chronic Issues    low bp, pill dysphagia     HPI:    Problem List Items Addressed This Visit   Edema (Chronic)     Resolved on lasix 20mg  started 06/20/12--weights down from 115 to 110-continue to monitor wt.           Pill dysphagia (Chronic)     New, will dc Fe since Hgb is normalized per CBC 06/26/12, change Metoprolol from succinate to tartrate--may consider bed side swallow evaluation if no better. C/o sore throat to her nurse 06/28/12--stated its all better today, no erythema or ulceration of her throat upon my examination 06/29/12    HYPERTENSION, BENIGN - Primary     Low measurements noted,  Will decrease Clonidine to 0.2mg , continue  Enalapril 20mg  bid, and switch Metoprolol XL 100mg  bid to tartrate bid due to pill dysphagia.         Atrial fibrillation     Rate controlled, takes Dig and Coumadin          Acute blood loss anemia     Hgb normalized 06/26/12--dc Fe and f/u CBC in one month.     Hypothyroidism     Levothyroxine since 05/01/12-f/u TSH scheduled         Acute bronchitis     Resolved on Augmentin 875mg  bid and Mucinex bid for 7 days.          Review of Systems:  Review of Systems  Constitutional: Positive for malaise/fatigue. Negative for fever, chills, weight loss and diaphoresis.  HENT: Negative for congestion, sore throat, neck pain and ear discharge. Hearing loss: ear wax removed manually.    Eyes: Negative for pain, discharge and redness.  Respiratory: Negative for cough, sputum production and wheezing.   Cardiovascular: Positive for PND. Negative for chest pain, palpitations, orthopnea and claudication. Leg swelling: better in the LLE.  Gastrointestinal: Negative for heartburn, nausea, vomiting, abdominal pain, diarrhea, constipation and blood in stool.   Pill dysphagia  Genitourinary: Positive for frequency (incontinent of bladder). Negative for dysuria, urgency, hematuria and flank pain.  Musculoskeletal: Positive for joint pain. Negative for myalgias, back pain and falls.  Skin: Negative for itching and rash.       Chronic BLE venous insufficiency  Neurological: Positive for weakness (generalized). Negative for dizziness, tingling, tremors, sensory change, speech change, focal weakness, seizures and loss of consciousness.  Endo/Heme/Allergies: Negative for environmental allergies and polydipsia. Bruises/bleeds easily.  Psychiatric/Behavioral: Positive for depression and memory loss. Negative for suicidal ideas, hallucinations and substance abuse. The patient is not nervous/anxious and does not have insomnia.      Medications: Patient's Medications  New Prescriptions   No medications on file  Previous Medications   ACETAMINOPHEN (TYLENOL) 500 MG TABLET    Take 500 mg by mouth 2 (two) times daily.    CLONIDINE (CATAPRES) 0.2 MG TABLET    Take 0.2 mg by mouth daily.   DIGOXIN (LANOXIN) 0.125 MG TABLET    Take 125 mcg by mouth daily.     DONEPEZIL (ARICEPT) 10 MG TABLET    Take 10 mg by mouth at bedtime.    ENALAPRIL (VASOTEC) 20 MG TABLET    Take 20 mg by mouth 2 (two) times daily.     LEVOTHYROXINE (SYNTHROID, LEVOTHROID) 125 MCG TABLET    Take 125  mcg by mouth daily.   METOPROLOL SUCCINATE (TOPROL-XL) 100 MG 24 HR TABLET    Take 100 mg by mouth 2 (two) times daily. Take with or immediately following a meal.   MULTIPLE VITAMINS-MINERALS (PRESERVISION/LUTEIN) CAPS    Take by mouth 2 (two) times daily.     PROBIOTIC PRODUCT (ALIGN) 4 MG CAPS    Take 4 mg by mouth daily.   Modified Medications   No medications on file  Discontinued Medications   No medications on file     Physical Exam:Physical Exam  Constitutional: She is oriented to person, place, and time. She appears well-developed and well-nourished. No distress.  HENT:  Head:  Normocephalic and atraumatic.  Right Ear: Tympanic membrane normal. No tenderness. No mastoid tenderness. Decreased hearing is noted.  Left Ear: Tympanic membrane normal. No tenderness. No mastoid tenderness. Decreased hearing is noted.  Impacted ear wax removed with curette.   Eyes: Conjunctivae and EOM are normal. Pupils are equal, round, and reactive to light.  Neck: Normal range of motion. Neck supple. No JVD present. No thyromegaly present.  Cardiovascular: Normal rate.  An irregular rhythm present.  Murmur heard.  Systolic murmur is present with a grade of 2/6  Left upper chest pacemaker.   Pulmonary/Chest: Effort normal. She has no wheezes. She has rales (bibasilar dry rales). She exhibits no tenderness.  Abdominal: Soft. Bowel sounds are normal. There is no tenderness.  Musculoskeletal: Normal range of motion. She exhibits no edema (BLE. L>R).  Lymphadenopathy:    She has no cervical adenopathy.  Neurological: She is alert and oriented to person, place, and time. She has normal reflexes. No cranial nerve deficit. She exhibits normal muscle tone. Coordination normal.  Skin: Skin is warm and dry. No rash noted. She is not diaphoretic. There is erythema (bilagteral lower legs chronic).  Psychiatric: Her speech is normal. Her affect is angry, blunt and inappropriate. She is aggressive. Thought content is not paranoid and not delusional. Cognition and memory are impaired. She expresses impulsivity and inappropriate judgment. She exhibits a depressed mood. She exhibits abnormal recent memory.     Filed Vitals:   06/29/12 1112  BP: 88/60  Pulse: 68  Resp: 16      Labs reviewed: Basic Metabolic Panel:  Recent Labs  16/10/96 0457 02/08/12 0610 02/09/12 0500 06/04/12 06/26/12  NA 140 142 144 141 138  K 3.9 3.5 3.8 4.1 4.0  CL 109 109 111  --   --   CO2 24 24 24   --   --   GLUCOSE 86 97 90  --   --   BUN 21 15 16 15 10   CREATININE 0.90 0.90 0.92 1.1 0.8  CALCIUM 8.2* 8.9 8.8   --   --     Liver Function Tests:  Recent Labs  02/07/12 0457  AST 11  ALT 5  ALKPHOS 65  BILITOT 0.4  PROT 5.1*  ALBUMIN 2.3*    CBC:  Recent Labs  02/06/12 0815  02/08/12 0610 02/08/12 1942 02/09/12 0540 06/11/12 06/26/12  WBC 5.7  < > 5.8 7.1 6.4 4.4 7.7  NEUTROABS 3.5  --   --   --   --   --   --   HGB 11.5*  < > 9.9* 10.0* 9.3* 11.6* 13.4  HCT 35.7*  < > 31.5* 31.6* 29.8* 36 41  MCV 91.5  < > 92.9 92.7 94.3  --   --   PLT 196  < > 197 185 172 141*  227  < > = values in this interval not displayed.  Anemia Panel: No results found for this basename: IRON, FOLATE, VITAMINB12,  in the last 8760 hours  Significant Diagnostic Results:     Assessment/Plan HYPERTENSION, BENIGN Low measurements noted,  Will decrease Clonidine to 0.2mg , continue  Enalapril 20mg  bid, and switch Metoprolol XL 100mg  bid to tartrate bid due to pill dysphagia.       Edema Resolved on lasix 20mg  started 06/20/12--weights down from 115 to 110-continue to monitor wt.         Pill dysphagia New, will dc Fe since Hgb is normalized per CBC 06/26/12, change Metoprolol from succinate to tartrate--may consider bed side swallow evaluation if no better. C/o sore throat to her nurse 06/28/12--stated its all better today, no erythema or ulceration of her throat upon my examination 06/29/12  Acute blood loss anemia Hgb normalized 06/26/12--dc Fe and f/u CBC in one month.   Hypothyroidism Levothyroxine since 05/01/12-f/u TSH scheduled       Acute bronchitis Resolved on Augmentin 875mg  bid and Mucinex bid for 7 days.     Atrial fibrillation Rate controlled, takes Dig and Coumadin            Family/ staff Communication: monitor weight, blood pressure, edema, swallowing   Goals of care: SNF   Labs/tests ordered CBC in one month.

## 2012-06-29 NOTE — Assessment & Plan Note (Signed)
Resolved on lasix 20mg  started 06/20/12--weights down from 115 to 110-continue to monitor wt.

## 2012-06-29 NOTE — Assessment & Plan Note (Signed)
Rate controlled, takes Dig and Coumadin                 

## 2012-06-29 NOTE — Assessment & Plan Note (Signed)
Hgb normalized 06/26/12--dc Fe and f/u CBC in one month.

## 2012-06-29 NOTE — Assessment & Plan Note (Addendum)
New, will dc Fe since Hgb is normalized per CBC 06/26/12, change Metoprolol from succinate to tartrate--may consider bed side swallow evaluation if no better. C/o sore throat to her nurse 06/28/12--stated its all better today, no erythema or ulceration of her throat upon my examination 06/29/12

## 2012-06-29 NOTE — Assessment & Plan Note (Signed)
Resolved on Augmentin 875mg  bid and Mucinex bid for 7 days.

## 2012-07-09 ENCOUNTER — Ambulatory Visit (INDEPENDENT_AMBULATORY_CARE_PROVIDER_SITE_OTHER): Payer: Medicare Other | Admitting: *Deleted

## 2012-07-09 ENCOUNTER — Encounter: Payer: Self-pay | Admitting: Internal Medicine

## 2012-07-09 DIAGNOSIS — I498 Other specified cardiac arrhythmias: Secondary | ICD-10-CM

## 2012-07-09 DIAGNOSIS — Z950 Presence of cardiac pacemaker: Secondary | ICD-10-CM

## 2012-07-09 LAB — REMOTE PACEMAKER DEVICE
BMOD-0002RV: 12
RV LEAD AMPLITUDE: 12 mv
RV LEAD IMPEDENCE PM: 630 Ohm
VENTRICULAR PACING PM: 93

## 2012-07-13 ENCOUNTER — Non-Acute Institutional Stay (SKILLED_NURSING_FACILITY): Payer: Medicare Other | Admitting: Nurse Practitioner

## 2012-07-13 DIAGNOSIS — E039 Hypothyroidism, unspecified: Secondary | ICD-10-CM

## 2012-07-13 DIAGNOSIS — R609 Edema, unspecified: Secondary | ICD-10-CM

## 2012-07-13 DIAGNOSIS — F329 Major depressive disorder, single episode, unspecified: Secondary | ICD-10-CM

## 2012-07-13 DIAGNOSIS — I10 Essential (primary) hypertension: Secondary | ICD-10-CM

## 2012-07-13 DIAGNOSIS — G3184 Mild cognitive impairment, so stated: Secondary | ICD-10-CM

## 2012-07-13 DIAGNOSIS — D62 Acute posthemorrhagic anemia: Secondary | ICD-10-CM

## 2012-07-13 DIAGNOSIS — I4891 Unspecified atrial fibrillation: Secondary | ICD-10-CM

## 2012-07-13 NOTE — Assessment & Plan Note (Signed)
No change on lasix 20mg  started 06/20/12--weights down from 115 to 110-continue to monitor wt. Left ankle edema noted.

## 2012-07-13 NOTE — Assessment & Plan Note (Signed)
Levothyroxine 100mcg since 05/01/12-f/u TSH scheduled  

## 2012-07-13 NOTE — Assessment & Plan Note (Signed)
Resides at SNF, takes Aricept, progressed rapidly in the past year.    

## 2012-07-13 NOTE — Assessment & Plan Note (Signed)
Rate controlled, takes Dig and Coumadin                 

## 2012-07-13 NOTE — Progress Notes (Signed)
Patient ID: ANDEE Salas, female   DOB: [birth year], 79 y.o.   MRN: [id number]  Chief Complaint:  Chief Complaint  Patient presents with  . Medical Managment of Chronic Issues     HPI:    Problem List Items Addressed This Visit   Edema - Primary (Chronic)     No change on lasix 20mg  started 06/20/12--weights down from 115 to 110-continue to monitor wt. Left ankle edema noted.             HYPERTENSION, BENIGN     Controlled on  Clonidine to 0.2mg ,  Enalapril 20mg  bid, and switched Metoprolol XL 100mg  bid to tartrate bid due to pill dysphagia.           Atrial fibrillation     Rate controlled, takes Dig and Coumadin            Acute blood loss anemia     Hgb normalized 06/26/12--dc'd Fe and f/u CBC in one month. Continued Folic acid and B12.       Hypothyroidism     Levothyroxine since 05/01/12-f/u TSH scheduled           Mild cognitive impairment     Resides at SNF, takes Aricept, progressed rapidly in the past year.         Depression     Sad facial looks, takes Lexapro 5mg             Review of Systems:  Review of Systems  Constitutional: Positive for malaise/fatigue. Negative for fever, chills, weight loss and diaphoresis.  HENT: Negative for congestion, sore throat, neck pain and ear discharge. Hearing loss: ear wax removed manually.    Eyes: Negative for pain, discharge and redness.  Respiratory: Negative for cough, sputum production and wheezing.   Cardiovascular: Positive for PND. Negative for chest pain, palpitations, orthopnea and claudication. Leg swelling: better in the LLE.  Gastrointestinal: Negative for heartburn, nausea, vomiting, abdominal pain, diarrhea, constipation and blood in stool.       Pill dysphagia  Genitourinary: Positive for frequency (incontinent of bladder). Negative for dysuria, urgency, hematuria and flank pain.  Musculoskeletal: Positive for joint pain. Negative for myalgias, back pain and falls.   Skin: Negative for itching and rash.       Chronic BLE venous insufficiency  Neurological: Positive for weakness (generalized). Negative for dizziness, tingling, tremors, sensory change, speech change, focal weakness, seizures and loss of consciousness.  Endo/Heme/Allergies: Negative for environmental allergies and polydipsia. Bruises/bleeds easily.  Psychiatric/Behavioral: Positive for depression and memory loss. Negative for suicidal ideas, hallucinations and substance abuse. The patient is not nervous/anxious and does not have insomnia.      Medications: Patient's Medications  New Prescriptions   No medications on file  Previous Medications   ACETAMINOPHEN (TYLENOL) 500 MG TABLET    Take 500 mg by mouth 2 (two) times daily.    CLONIDINE (CATAPRES) 0.2 MG TABLET    Take 0.2 mg by mouth daily.   DIGOXIN (LANOXIN) 0.125 MG TABLET    Take 125 mcg by mouth daily.     DONEPEZIL (ARICEPT) 10 MG TABLET    Take 10 mg by mouth at bedtime.    ENALAPRIL (VASOTEC) 20 MG TABLET    Take 20 mg by mouth 2 (two) times daily.     LEVOTHYROXINE (SYNTHROID, LEVOTHROID) 125 MCG TABLET    Take 125 mcg by mouth daily.   METOPROLOL SUCCINATE (TOPROL-XL) 100 MG 24 HR TABLET    Take 100 mg by  mouth 2 (two) times daily. Take with or immediately following a meal.   MULTIPLE VITAMINS-MINERALS (PRESERVISION/LUTEIN) CAPS    Take by mouth 2 (two) times daily.     PROBIOTIC PRODUCT (ALIGN) 4 MG CAPS    Take 4 mg by mouth daily.   Modified Medications   No medications on file  Discontinued Medications   No medications on file     Physical Exam: Physical Exam  Constitutional: She is oriented to person, place, and time. She appears well-developed and well-nourished. No distress.  HENT:  Head: Normocephalic and atraumatic.  Right Ear: Tympanic membrane normal. No tenderness. No mastoid tenderness. Decreased hearing is noted.  Left Ear: Tympanic membrane normal. No tenderness. No mastoid tenderness. Decreased  hearing is noted.  Impacted ear wax removed with curette.   Eyes: Conjunctivae and EOM are normal. Pupils are equal, round, and reactive to light.  Neck: Normal range of motion. Neck supple. No JVD present. No thyromegaly present.  Cardiovascular: Normal rate.  An irregular rhythm present.  Murmur heard.  Systolic murmur is present with a grade of 2/6  Left upper chest pacemaker.   Pulmonary/Chest: Effort normal. She has no wheezes. She has rales (bibasilar dry rales). She exhibits no tenderness.  Abdominal: Soft. Bowel sounds are normal. There is no tenderness.  Musculoskeletal: Normal range of motion. She exhibits edema (BLE. L>R left ankle. ).  Lymphadenopathy:    She has no cervical adenopathy.  Neurological: She is alert and oriented to person, place, and time. She has normal reflexes. No cranial nerve deficit. She exhibits normal muscle tone. Coordination normal.  Skin: Skin is warm and dry. No rash noted. She is not diaphoretic. There is erythema (bilagteral lower legs chronic).  Psychiatric: Her speech is normal. Her affect is angry, blunt and inappropriate. She is aggressive. Thought content is not paranoid and not delusional. Cognition and memory are impaired. She expresses impulsivity and inappropriate judgment. She exhibits a depressed mood. She exhibits abnormal recent memory.     Filed Vitals:   07/13/12 1638  BP: 152/79  Pulse: 69  Temp: 97 F (36.1 C)  TempSrc: Tympanic  Resp: 18      Labs reviewed: Basic Metabolic Panel:  Recent Labs  16/10/96 0457 02/08/12 0610 02/09/12 0500 06/04/12 06/26/12  NA 140 142 144 141 138  K 3.9 3.5 3.8 4.1 4.0  CL 109 109 111  --   --   CO2 24 24 24   --   --   GLUCOSE 86 97 90  --   --   BUN 21 15 16 15 10   CREATININE 0.90 0.90 0.92 1.1 0.8  CALCIUM 8.2* 8.9 8.8  --   --     Liver Function Tests:  Recent Labs  02/07/12 0457  AST 11  ALT 5  ALKPHOS 65  BILITOT 0.4  PROT 5.1*  ALBUMIN 2.3*    CBC:  Recent  Labs  02/06/12 0815  02/08/12 0610 02/08/12 1942 02/09/12 0540 06/11/12 06/26/12  WBC 5.7  < > 5.8 7.1 6.4 4.4 7.7  NEUTROABS 3.5  --   --   --   --   --   --   HGB 11.5*  < > 9.9* 10.0* 9.3* 11.6* 13.4  HCT 35.7*  < > 31.5* 31.6* 29.8* 36 41  MCV 91.5  < > 92.9 92.7 94.3  --   --   PLT 196  < > 197 185 172 141* 227  < > = values in this interval  not displayed.  Anemia Panel: No results found for this basename: IRON, FOLATE, VITAMINB12,  in the last 8760 hours  Significant Diagnostic Results:     Assessment/Plan Edema No change on lasix 20mg  started 06/20/12--weights down from 115 to 110-continue to monitor wt. Left ankle edema noted.           Hypothyroidism Levothyroxine since 05/01/12-f/u TSH scheduled         HYPERTENSION, BENIGN Controlled on  Clonidine to 0.2mg ,  Enalapril 20mg  bid, and switched Metoprolol XL 100mg  bid to tartrate bid due to pill dysphagia.         Atrial fibrillation Rate controlled, takes Dig and Coumadin          Acute blood loss anemia Hgb normalized 06/26/12--dc'd Fe and f/u CBC in one month. Continued Folic acid and B12.     Depression Sad facial looks, takes Lexapro 5mg        Mild cognitive impairment Resides at SNF, takes Aricept, progressed rapidly in the past year.           Family/ staff Communication: observe the patient   Goals of care: SNF   Labs/tests ordered PT/INR 2 weeks.

## 2012-07-13 NOTE — Assessment & Plan Note (Signed)
Hgb normalized 06/26/12--dc'd Fe and f/u CBC in one month. Continued Folic acid and B12.

## 2012-07-13 NOTE — Assessment & Plan Note (Signed)
Sad facial looks, takes Lexapro 5mg                        

## 2012-07-13 NOTE — Assessment & Plan Note (Signed)
Controlled on  Clonidine to 0.2mg ,  Enalapril 20mg  bid, and switched Metoprolol XL 100mg  bid to tartrate bid due to pill dysphagia.

## 2012-07-23 ENCOUNTER — Encounter: Payer: Self-pay | Admitting: *Deleted

## 2012-07-25 ENCOUNTER — Other Ambulatory Visit: Payer: Self-pay

## 2012-07-26 LAB — CBC AND DIFFERENTIAL
HCT: 36 % (ref 36–46)
Hemoglobin: 11.9 g/dL — AB (ref 12.0–16.0)

## 2012-07-31 ENCOUNTER — Non-Acute Institutional Stay (SKILLED_NURSING_FACILITY): Payer: Medicare Other | Admitting: Nurse Practitioner

## 2012-07-31 DIAGNOSIS — A4902 Methicillin resistant Staphylococcus aureus infection, unspecified site: Secondary | ICD-10-CM

## 2012-07-31 DIAGNOSIS — I10 Essential (primary) hypertension: Secondary | ICD-10-CM

## 2012-07-31 DIAGNOSIS — G3184 Mild cognitive impairment, so stated: Secondary | ICD-10-CM

## 2012-07-31 DIAGNOSIS — E039 Hypothyroidism, unspecified: Secondary | ICD-10-CM

## 2012-07-31 DIAGNOSIS — D62 Acute posthemorrhagic anemia: Secondary | ICD-10-CM

## 2012-08-07 ENCOUNTER — Encounter: Payer: Self-pay | Admitting: Nurse Practitioner

## 2012-08-07 DIAGNOSIS — A4902 Methicillin resistant Staphylococcus aureus infection, unspecified site: Secondary | ICD-10-CM | POA: Insufficient documentation

## 2012-08-07 NOTE — Assessment & Plan Note (Signed)
Resides at SNF, takes Aricept, progressed rapidly in the past year.    

## 2012-08-07 NOTE — Assessment & Plan Note (Addendum)
Controlled on  Clonidine to 0.2mg ,  Enalapril 20mg  bid, and  Metoprolol XL 100mg  bid

## 2012-08-07 NOTE — Assessment & Plan Note (Signed)
Resolved on lasix 20mg started 06/20/12--weights down from 115 to 110-continue to monitor wt.        

## 2012-08-07 NOTE — Assessment & Plan Note (Signed)
Hgb normalized 06/26/12--dc'd Fe and f/u CBC in one month showed Hgb dropped to 11.9. Continued Folic acid and B12. F/u CBC in 1 month

## 2012-08-07 NOTE — Assessment & Plan Note (Signed)
Levothyroxine 165mcg(was ) since 05/01/12 due to TSH 0.172 04/30/12 -f/u TSH 07/26/12 11.284--increase Levothyroxine to po daily, update TSH in 6 weeks.

## 2012-08-07 NOTE — Progress Notes (Signed)
Patient ID: [name], female   DOB: 04/22/1917, 79 y.o.   MRN: [id number] Code Status: DNR  Allergies  Allergen Reactions  . Asacol (Mesalamine)   . Aspirin Other (See Comments)    Per mar  . Butalbital-Asa-Caff-Codeine   . Codeine Other (See Comments)    Per mar    Chief Complaint  Patient presents with  . Medical Managment of Chronic Issues    hypothyroidism, Right face s/p lesion removal 07/25/12 and MRSA    HPI: Patient is a 79 y.o. female seen in the SNF at [facility] today for chronic medical issues.  Problem List Items Addressed This Visit   Acute blood loss anemia     Hgb normalized 06/26/12--dc'd Fe and f/u CBC in one month showed Hgb dropped to 11.9. Continued Folic acid and B12. F/u CBC in 1 month        HYPERTENSION, BENIGN     Controlled on  Clonidine to 0.2mg ,  Enalapril 20mg  bid, and  Metoprolol XL 100mg  bid             Hypothyroidism     Levothyroxine 181mcg(was ) since 05/01/12 due to TSH 0.172 04/30/12 -f/u TSH 07/26/12 11.284--increase Levothyroxine to po daily, update TSH in 6 weeks.             Mild cognitive impairment     Resides at SNF, takes Aricept, progressed rapidly in the past year.           MRSA infection - Primary     S/p right face skin lesion removal Dr. Annett Gula DS for total 3 weeks, will have PT/INR frequently checked while on ABT, observe the patient.       Review of Systems:  Review of Systems  Constitutional: Positive for malaise/fatigue. Negative for fever, chills, weight loss and diaphoresis.  HENT: Negative for congestion, sore throat, neck pain and ear discharge. Hearing loss: ear wax removed manually.    Eyes: Negative for pain, discharge and redness.  Respiratory: Negative for cough, sputum production and wheezing.   Cardiovascular: Positive for PND. Negative for chest pain, palpitations, orthopnea and claudication. Leg swelling: better in the LLE.  Gastrointestinal:  Negative for heartburn, nausea, vomiting, abdominal pain, diarrhea, constipation and blood in stool.       Pill dysphagia  Genitourinary: Positive for frequency (incontinent of bladder). Negative for dysuria, urgency, hematuria and flank pain.  Musculoskeletal: Positive for joint pain. Negative for myalgias, back pain and falls.  Skin: Negative for itching and rash.       Chronic BLE venous insufficiency. S/p right face skin lesion removal and treated for MRSA with Septra DS for total 3 weeks.   Neurological: Positive for weakness (generalized). Negative for dizziness, tingling, tremors, sensory change, speech change, focal weakness, seizures and loss of consciousness.  Endo/Heme/Allergies: Negative for environmental allergies and polydipsia. Bruises/bleeds easily.  Psychiatric/Behavioral: Positive for depression and memory loss. Negative for suicidal ideas, hallucinations and substance abuse. The patient is not nervous/anxious and does not have insomnia.      Past Medical History  Diagnosis Date  . Hypertension   . Hypothyroidism   . Osteoarthritis   . Osteoporosis   . Diverticulosis   . Chronic diarrhea   . Macular degeneration   . Kyphosis     of thoracic spine  . History of GI diverticular bleed Sept 3, 2003  . Toxic goiter     hx w subsequent ablation and hypothyroid state  . Mild cognitive disorder   .  Bronchiectasis     left lower lobe noted on x-ray 2/05  . Hiatal hernia     hx w repair  . Urinary incontinence   . Urinary tract bacterial infections   . Atrial fibrillation Sept 12, 2005    new onset  . Right renal atrophy 2005  . Pacemaker   . H/O Clostridium difficile infection   . Macular degeneration   . Diverticulosis    Past Surgical History  Procedure Laterality Date  . Repair of hiatal hernia and tube, gastrostomy.  08/06/1999  . Total knee arthroplasty  10/13/2003    Right  . Laparoscopic repair of ventral incisional hernia with  04/21/2000  . Colonoscopy   02/09/2012    Procedure: COLONOSCOPY;  Surgeon: Rachael Fee, MD;  Location: [facility];  Service: Endoscopy;  Laterality: N/A;   Social History:   reports that she has never smoked. She has never used smokeless tobacco. She reports that she does not drink alcohol or use illicit drugs.  Family History  Problem Relation Age of Onset  . Aneurysm Father   . Cancer Daughter     Breast and lung cancer  . Hypertension Son   . Hyperlipidemia Son     Medications: Patient's Medications  New Prescriptions   No medications on file  Previous Medications   ACETAMINOPHEN (TYLENOL) 500 MG TABLET    Take 500 mg by mouth 2 (two) times daily.    CLONIDINE (CATAPRES) 0.2 MG TABLET    Take 0.2 mg by mouth daily.   DIGOXIN (LANOXIN) 0.125 MG TABLET    Take 125 mcg by mouth daily.     DONEPEZIL (ARICEPT) 10 MG TABLET    Take 10 mg by mouth at bedtime.    ENALAPRIL (VASOTEC) 20 MG TABLET    Take 20 mg by mouth 2 (two) times daily.     LEVOTHYROXINE (SYNTHROID, LEVOTHROID) 125 MCG TABLET    Take 125 mcg by mouth daily.   METOPROLOL SUCCINATE (TOPROL-XL) 100 MG 24 HR TABLET    Take 100 mg by mouth 2 (two) times daily. Take with or immediately following a meal.   MULTIPLE VITAMINS-MINERALS (PRESERVISION/LUTEIN) CAPS    Take by mouth 2 (two) times daily.     PROBIOTIC PRODUCT (ALIGN) 4 MG CAPS    Take 4 mg by mouth daily.   Modified Medications   No medications on file  Discontinued Medications   No medications on file     Physical Exam: Physical Exam  Constitutional: She is oriented to person, place, and time. She appears well-developed and well-nourished. No distress.  HENT:  Head: Normocephalic and atraumatic.  Right Ear: Tympanic membrane normal. No tenderness. No mastoid tenderness. Decreased hearing is noted.  Left Ear: Tympanic membrane normal. No tenderness. No mastoid tenderness. Decreased hearing is noted.  Impacted ear wax removed with curette.   Eyes: Conjunctivae and EOM are normal.  Pupils are equal, round, and reactive to light.  Neck: Normal range of motion. Neck supple. No JVD present. No thyromegaly present.  Cardiovascular: Normal rate.  An irregular rhythm present.  Murmur heard.  Systolic murmur is present with a grade of 2/6  Left upper chest pacemaker.   Pulmonary/Chest: Effort normal. She has no wheezes. She has rales (bibasilar dry rales). She exhibits no tenderness.  Abdominal: Soft. Bowel sounds are normal. There is no tenderness.  Musculoskeletal: Normal range of motion. She exhibits edema (BLE. L>R left ankle. ).  Lymphadenopathy:    She has no cervical adenopathy.  Neurological: She is alert and oriented to person, place, and time. She has normal reflexes. No cranial nerve deficit. She exhibits normal muscle tone. Coordination normal.  Skin: Skin is warm and dry. No rash noted. She is not diaphoretic. There is erythema (bilagteral lower legs chronic).  S/p right face skin lesion removal and treated with Septra DS for total 3 weeks for MRSA  Psychiatric: Her speech is normal. Her affect is angry, blunt and inappropriate. She is aggressive. Thought content is not paranoid and not delusional. Cognition and memory are impaired. She expresses impulsivity and inappropriate judgment. She exhibits a depressed mood. She exhibits abnormal recent memory.    Filed Vitals:   08/07/12 1324  BP: 102/72  Pulse: 72  Temp: 97.2 F (36.2 C)  TempSrc: Tympanic  Resp: 20      Labs reviewed: Basic Metabolic Panel:  Recent Labs  16/10/96 0457 02/08/12 0610 02/09/12 0500 06/04/12 06/26/12 07/26/12  NA 140 142 144 141 138  --   K 3.9 3.5 3.8 4.1 4.0  --   CL 109 109 111  --   --   --   CO2 24 24 24   --   --   --   GLUCOSE 86 97 90  --   --   --   BUN 21 15 16 15 10   --   CREATININE 0.90 0.90 0.92 1.1 0.8  --   CALCIUM 8.2* 8.9 8.8  --   --   --   TSH  --   --   --   --   --  11.28*   Liver Function Tests:  Recent Labs  02/07/12 0457  AST 11  ALT 5   ALKPHOS 65  BILITOT 0.4  PROT 5.1*  ALBUMIN 2.3*   No results found for this basename: LIPASE, AMYLASE,  in the last 8760 hours No results found for this basename: AMMONIA,  in the last 8760 hours CBC:  Recent Labs  02/06/12 0815  02/08/12 0610 02/08/12 1942 02/09/12 0540 06/11/12 06/26/12 07/26/12  WBC 5.7  < > 5.8 7.1 6.4 4.4 7.7 6.1  NEUTROABS 3.5  --   --   --   --   --   --   --   HGB 11.5*  < > 9.9* 10.0* 9.3* 11.6* 13.4 11.9*  HCT 35.7*  < > 31.5* 31.6* 29.8* 36 41 36  MCV 91.5  < > 92.9 92.7 94.3  --   --   --   PLT 196  < > 197 185 172 141* 227 147*  < > = values in this interval not displayed. Lipid Panel: No results found for this basename: CHOL, HDL, LDLCALC, TRIG, CHOLHDL, LDLDIRECT,  in the last 8760 hours Anemia Panel: No results found for this basename: FOLATE, IRON, VITAMINB12,  in the last 8760 hours  Past Procedures:     Assessment/Plan MRSA infection S/p right face skin lesion removal Dr. Annett Gula DS for total 3 weeks, will have PT/INR frequently checked while on ABT, observe the patient.  Hypothyroidism Levothyroxine 198mcg(was ) since 05/01/12 due to TSH 0.172 04/30/12 -f/u TSH 07/26/12 11.284--increase Levothyroxine to po daily, update TSH in 6 weeks.           Acute blood loss anemia Hgb normalized 06/26/12--dc'd Fe and f/u CBC in one month showed Hgb dropped to 11.9. Continued Folic acid and B12. F/u CBC in 1 month      Mild cognitive impairment Resides at SNF, takes Aricept, progressed  rapidly in the past year.         CHF (congestive heart failure) Resolved on lasix 20mg  started 06/20/12--weights down from 115 to 110-continue to monitor wt.           HYPERTENSION, BENIGN Controlled on  Clonidine to 0.2mg ,  Enalapril 20mg  bid, and  Metoprolol XL 100mg  bid             Family/ Staff Communication:   Goals of Care:  Labs/tests ordered:

## 2012-08-07 NOTE — Assessment & Plan Note (Signed)
S/p right face skin lesion removal Dr. Annett Gula DS for total 3 weeks, will have PT/INR frequently checked while on ABT, observe the patient.

## 2012-08-10 ENCOUNTER — Encounter: Payer: Self-pay | Admitting: Nurse Practitioner

## 2012-08-10 ENCOUNTER — Non-Acute Institutional Stay (SKILLED_NURSING_FACILITY): Payer: Medicare Other | Admitting: Nurse Practitioner

## 2012-08-10 DIAGNOSIS — R55 Syncope and collapse: Secondary | ICD-10-CM

## 2012-08-10 DIAGNOSIS — I10 Essential (primary) hypertension: Secondary | ICD-10-CM

## 2012-08-10 DIAGNOSIS — I5040 Unspecified combined systolic (congestive) and diastolic (congestive) heart failure: Secondary | ICD-10-CM

## 2012-08-10 DIAGNOSIS — F3289 Other specified depressive episodes: Secondary | ICD-10-CM

## 2012-08-10 DIAGNOSIS — G3184 Mild cognitive impairment, so stated: Secondary | ICD-10-CM

## 2012-08-10 DIAGNOSIS — I4891 Unspecified atrial fibrillation: Secondary | ICD-10-CM

## 2012-08-10 DIAGNOSIS — F329 Major depressive disorder, single episode, unspecified: Secondary | ICD-10-CM

## 2012-08-10 DIAGNOSIS — Z7901 Long term (current) use of anticoagulants: Secondary | ICD-10-CM

## 2012-08-10 NOTE — Assessment & Plan Note (Addendum)
Hx of syncope--has not had an episode until 08/09/12--monitor. May have CT head, carotid doppler, EKG if the event recurs.

## 2012-08-10 NOTE — Assessment & Plan Note (Signed)
Rate controlled, takes Dig and Coumadin

## 2012-08-10 NOTE — Progress Notes (Signed)
Subjective:    Patient ID: [name], female    DOB: [birth year], 79 y.o.   MRN: [id number] Code Status: DNR  Allergies  Allergen Reactions  . Asacol (Mesalamine)   . Aspirin Other (See Comments)    Per mar  . Butalbital-Asa-Caff-Codeine   . Codeine Other (See Comments)    Per mar    Chief Complaint  Patient presents with  . Medical Managment of Chronic Issues    s/p unresponsive and hypotensive episode    HPI: Patient is a 79 y.o. female seen in the SNF at [facility] today for evaluation of syncope episode and other chronic medical conditions.  Problem List Items Addressed This Visit   Atrial fibrillation     Rate controlled, takes Dig and Coumadin              CHF (congestive heart failure)     Resolved on lasix 20mg  started 06/20/12--weights down from 115 to 110-continue to monitor wt.               Chronic anticoagulation     Adjust Coumadin per PT/INR, goal is 2-3      Depression     Sad facial looks, takes Lexapro 5mg            HYPERTENSION, BENIGN - Primary     Unresponsive and Bp 70 episode--resolved after the patient was placed supine in bed--no  Focal neurological deficit seen, denied chest pain, or dizziness, palpitation associated the event. Has Cardiology f/u next month or sooner if this recurs. Decrease Clonidine to 0.1mg (was reduced to 0.2mg  06/29/12). Bp daily and observe the patient.     Mild cognitive impairment     Resides at SNF, takes Aricept, progressed rapidly in the past year.           SYNCOPE     Hx of syncope--has not had an episode until 08/09/12--monitor. May have CT head, carotid doppler, EKG if the event recurs.        Review of Systems:  Review of Systems  Constitutional: Positive for malaise/fatigue. Negative for fever, chills, weight loss and diaphoresis.  HENT: Negative for congestion, sore throat, neck pain and ear discharge. Hearing loss: ear wax removed manually.    Eyes: Negative  for pain, discharge and redness.  Respiratory: Negative for cough, sputum production and wheezing.   Cardiovascular: Positive for PND. Negative for chest pain, palpitations, orthopnea and claudication. Leg swelling: better in the LLE.  Gastrointestinal: Negative for heartburn, nausea, vomiting, abdominal pain, diarrhea, constipation and blood in stool.       Pill dysphagia  Genitourinary: Positive for frequency (incontinent of bladder). Negative for dysuria, urgency, hematuria and flank pain.  Musculoskeletal: Positive for joint pain. Negative for myalgias, back pain and falls.  Skin: Negative for itching and rash.       Chronic BLE venous insufficiency. S/p right face skin lesion removal and treated for MRSA with Septra DS for total 3 weeks.   Neurological: Positive for weakness (generalized). Negative for dizziness, tingling, tremors, sensory change, speech change, focal weakness, seizures and loss of consciousness.  Endo/Heme/Allergies: Negative for environmental allergies and polydipsia. Bruises/bleeds easily.  Psychiatric/Behavioral: Positive for depression and memory loss. Negative for suicidal ideas, hallucinations and substance abuse. The patient is not nervous/anxious and does not have insomnia.      Past Medical History  Diagnosis Date  . Hypertension   . Hypothyroidism   . Osteoarthritis   . Osteoporosis   . Diverticulosis   .  Chronic diarrhea   . Macular degeneration   . Kyphosis     of thoracic spine  . History of GI diverticular bleed Sept 3, 2003  . Toxic goiter     hx w subsequent ablation and hypothyroid state  . Mild cognitive disorder   . Bronchiectasis     left lower lobe noted on x-ray 2/05  . Hiatal hernia     hx w repair  . Urinary incontinence   . Urinary tract bacterial infections   . Atrial fibrillation Sept 12, 2005    new onset  . Right renal atrophy 2005  . Pacemaker   . H/O Clostridium difficile infection   . Macular degeneration   .  Diverticulosis    Past Surgical History  Procedure Laterality Date  . Repair of hiatal hernia and tube, gastrostomy.  08/06/1999  . Total knee arthroplasty  10/13/2003    Right  . Laparoscopic repair of ventral incisional hernia with  04/21/2000  . Colonoscopy  02/09/2012    Procedure: COLONOSCOPY;  Surgeon: Rachael Fee, MD;  Location: [facility];  Service: Endoscopy;  Laterality: N/A;   Social History:   reports that she has never smoked. She has never used smokeless tobacco. She reports that she does not drink alcohol or use illicit drugs.  Family History  Problem Relation Age of Onset  . Aneurysm Father   . Cancer Daughter     Breast and lung cancer  . Hypertension Son   . Hyperlipidemia Son     Medications:  Filed Vitals:   08/10/12 1556  BP: 100/70  Pulse: 71  Temp: 96.5 F (35.8 C)  TempSrc: Tympanic  Resp: 20      Labs reviewed: Basic Metabolic Panel:  Recent Labs  16/10/96 0457 02/08/12 0610 02/09/12 0500 06/04/12 06/26/12 07/26/12  NA 140 142 144 141 138  --   K 3.9 3.5 3.8 4.1 4.0  --   CL 109 109 111  --   --   --   CO2 24 24 24   --   --   --   GLUCOSE 86 97 90  --   --   --   BUN 21 15 16 15 10   --   CREATININE 0.90 0.90 0.92 1.1 0.8  --   CALCIUM 8.2* 8.9 8.8  --   --   --   TSH  --   --   --   --   --  11.28*   Liver Function Tests:  Recent Labs  02/07/12 0457  AST 11  ALT 5  ALKPHOS 65  BILITOT 0.4  PROT 5.1*  ALBUMIN 2.3*    CBC:  Recent Labs  02/06/12 0815  02/08/12 0610 02/08/12 1942 02/09/12 0540 06/11/12 06/26/12 07/26/12  WBC 5.7  < > 5.8 7.1 6.4 4.4 7.7 6.1  NEUTROABS 3.5  --   --   --   --   --   --   --   HGB 11.5*  < > 9.9* 10.0* 9.3* 11.6* 13.4 11.9*  HCT 35.7*  < > 31.5* 31.6* 29.8* 36 41 36  MCV 91.5  < > 92.9 92.7 94.3  --   --   --   PLT 196  < > 197 185 172 141* 227 147*  < > = values in this interval not displayed.      Assessment/Plan HYPERTENSION, BENIGN Unresponsive and Bp 70 episode--resolved  after the patient was placed supine in bed--no  Focal neurological deficit  seen, denied chest pain, or dizziness, palpitation associated the event. Has Cardiology f/u next month or sooner if this recurs. Decrease Clonidine to 0.1mg (was reduced to 0.2mg  06/29/12). Bp daily and observe the patient.   SYNCOPE Hx of syncope--has not had an episode until 08/09/12--monitor. May have CT head, carotid doppler, EKG if the event recurs.   CHF (congestive heart failure) Resolved on lasix 20mg  started 06/20/12--weights down from 115 to 110-continue to monitor wt.             Depression Sad facial looks, takes Lexapro 5mg          Mild cognitive impairment Resides at SNF, takes Aricept, progressed rapidly in the past year.         Atrial fibrillation Rate controlled, takes Dig and Coumadin            Chronic anticoagulation Adjust Coumadin per PT/INR, goal is 2-3      Family/ Staff Communication: continue to observe the patient.   Goals of Care: SNF  Labs/tests ordered: none        HPI    Review of Systems  Constitutional: Positive for malaise/fatigue. Negative for fever, chills, weight loss and diaphoresis.  HENT: Negative for congestion, sore throat, neck pain and ear discharge. Hearing loss: ear wax removed manually.    Eyes: Negative for pain, discharge and redness.  Respiratory: Negative for cough, sputum production and wheezing.   Cardiovascular: Positive for PND. Negative for chest pain, palpitations, orthopnea and claudication. Leg swelling: better in the LLE.  Gastrointestinal: Negative for heartburn, nausea, vomiting, abdominal pain, diarrhea, constipation and blood in stool.       Pill dysphagia  Endocrine: Negative for polydipsia.  Genitourinary: Positive for frequency (incontinent of bladder). Negative for dysuria, urgency, hematuria and flank pain.  Musculoskeletal: Positive for joint pain. Negative for myalgias, back pain and falls.   Skin: Negative for itching and rash.       Chronic BLE venous insufficiency. S/p right face skin lesion removal and treated for MRSA with Septra DS for total 3 weeks.   Allergic/Immunologic: Negative for environmental allergies.  Neurological: Positive for weakness (generalized). Negative for dizziness, tingling, tremors, sensory change, speech change, focal weakness, seizures and loss of consciousness.  Hematological: Bruises/bleeds easily.  Psychiatric/Behavioral: Positive for depression and memory loss. Negative for suicidal ideas, hallucinations and substance abuse. The patient is not nervous/anxious and does not have insomnia.        Objective:   Physical Exam        Assessment & Plan:

## 2012-08-10 NOTE — Assessment & Plan Note (Signed)
Sad facial looks, takes Lexapro 5mg                        

## 2012-08-10 NOTE — Assessment & Plan Note (Signed)
Unresponsive and Bp 70 episode--resolved after the patient was placed supine in bed--no  Focal neurological deficit seen, denied chest pain, or dizziness, palpitation associated the event. Has Cardiology f/u next month or sooner if this recurs. Decrease Clonidine to 0.1mg (was reduced to 0.2mg  06/29/12). Bp daily and observe the patient.

## 2012-08-10 NOTE — Assessment & Plan Note (Signed)
Adjust Coumadin per PT/INR, goal is 2-3

## 2012-08-10 NOTE — Assessment & Plan Note (Signed)
Resides at SNF, takes Aricept, progressed rapidly in the past year.    

## 2012-08-10 NOTE — Assessment & Plan Note (Signed)
Resolved on lasix 20mg  started 06/20/12--weights down from 115 to 110-continue to monitor wt.

## 2012-08-24 ENCOUNTER — Telehealth: Payer: Self-pay | Admitting: Internal Medicine

## 2012-08-24 ENCOUNTER — Non-Acute Institutional Stay (SKILLED_NURSING_FACILITY): Payer: Medicare Other | Admitting: Nurse Practitioner

## 2012-08-24 ENCOUNTER — Encounter: Payer: Self-pay | Admitting: Nurse Practitioner

## 2012-08-24 DIAGNOSIS — R55 Syncope and collapse: Secondary | ICD-10-CM

## 2012-08-24 DIAGNOSIS — I5040 Unspecified combined systolic (congestive) and diastolic (congestive) heart failure: Secondary | ICD-10-CM

## 2012-08-24 DIAGNOSIS — F329 Major depressive disorder, single episode, unspecified: Secondary | ICD-10-CM

## 2012-08-24 DIAGNOSIS — D62 Acute posthemorrhagic anemia: Secondary | ICD-10-CM

## 2012-08-24 DIAGNOSIS — E039 Hypothyroidism, unspecified: Secondary | ICD-10-CM

## 2012-08-24 NOTE — Assessment & Plan Note (Signed)
3x in the last month, Clonidine was decreased to 0.1mg  since 08/10/12, Furosemide 20mg  06/20/12 for BLE edam, apparently she is experiencing orthostatic Bp changes while on Commode--will hold Lasix, encourage oral fluid intake, obtain CBC and CMP. F/u Cardiology for pacemaker and cardiac etiology. No focal neurological deficit except the patient appears sleepy this am.

## 2012-08-24 NOTE — Telephone Encounter (Signed)
Spoke with pt dtr, aware dr Graciela Husbands has not heard from dr balford. Confirmed with dtr there is nothing on the device that would explain the syncopal episode. Aware we agree with the med change and the need for increased fluids. She will call back with any other questions.

## 2012-08-24 NOTE — Telephone Encounter (Signed)
Spoke with pt dtr, aware have sent a text to dr Graciela Husbands, he is in the hosp all day. dtr said they were going to look over the pts merlin readings for the month of July. Will get in touch with amber seiler rn to see if she can help me with this since there are no device nurses here today. Pt dtr very anxious to hear something. Assurance given that I will try to find out something for her. She voiced understanding.

## 2012-08-24 NOTE — Telephone Encounter (Signed)
Follow Up     Pts daughter calling back in following up on call from earlier.

## 2012-08-24 NOTE — Progress Notes (Signed)
Patient ID: [name], female   DOB: Aug 06, 1917, 79 y.o.   MRN: [id number] Code Status: DNR MOST  Allergies  Allergen Reactions  . Asacol (Mesalamine)   . Aspirin Other (See Comments)    Per mar  . Butalbital-Asa-Caff-Codeine   . Codeine Other (See Comments)    Per mar    Chief Complaint  Patient presents with  . Medical Managment of Chronic Issues    Syncope, weight loss    HPI: Patient is a 79 y.o. female seen in the SNF at [facility] today for Syncope episode last night while on commode and other chronic medical conditions.  Problem List Items Addressed This Visit   Acute blood loss anemia     Check CBC    CHF (congestive heart failure)     Resolved on lasix 20mg  started 06/20/12--weights down from 115 to 110-continue to monitor wt. Hold Furosemide for now.                 Depression     Sad facial looks, takes Lexapro 5mg              Hypothyroidism     Levothyroxine 13mcg(was ) since 05/01/12 due to TSH 0.172 04/30/12 -f/u TSH 07/26/12 11.284--increase Levothyroxine to po daily, update TSH in 6 weeks.               SYNCOPE - Primary     3x in the last month, Clonidine was decreased to 0.1mg  since 08/10/12, Furosemide 20mg  06/20/12 for BLE edam, apparently she is experiencing orthostatic Bp changes while on Commode--will hold Lasix, encourage oral fluid intake, obtain CBC and CMP. F/u Cardiology for pacemaker and cardiac etiology. No focal neurological deficit except the patient appears sleepy this am.        Review of Systems:  Review of Systems  Constitutional: Positive for malaise/fatigue. Negative for fever, chills, weight loss and diaphoresis.  HENT: Negative for congestion, sore throat, neck pain and ear discharge. Hearing loss: ear wax removed manually.    Eyes: Negative for pain, discharge and redness.  Respiratory: Negative for cough, sputum production and wheezing.   Cardiovascular: Positive for PND. Negative  for chest pain, palpitations, orthopnea and claudication. Leg swelling: better in the LLE.  Gastrointestinal: Negative for heartburn, nausea, vomiting, abdominal pain, diarrhea, constipation and blood in stool.       Pill dysphagia  Genitourinary: Positive for frequency (incontinent of bladder). Negative for dysuria, urgency, hematuria and flank pain.  Musculoskeletal: Positive for joint pain. Negative for myalgias, back pain and falls.  Skin: Negative for itching and rash.       Chronic BLE venous insufficiency. S/p right face skin lesion removal and treated for MRSA with Septra DS for total 3 weeks.   Neurological: Positive for weakness (generalized). Negative for dizziness, tingling, tremors, sensory change, speech change, focal weakness, seizures and loss of consciousness.  Endo/Heme/Allergies: Negative for environmental allergies and polydipsia. Bruises/bleeds easily.  Psychiatric/Behavioral: Positive for depression and memory loss. Negative for suicidal ideas, hallucinations and substance abuse. The patient is not nervous/anxious and does not have insomnia.      Past Medical History  Diagnosis Date  . Hypertension   . Hypothyroidism   . Osteoarthritis   . Osteoporosis   . Diverticulosis   . Chronic diarrhea   . Macular degeneration   . Kyphosis     of thoracic spine  . History of GI diverticular bleed Sept 3, 2003  . Toxic goiter  hx w subsequent ablation and hypothyroid state  . Mild cognitive disorder   . Bronchiectasis     left lower lobe noted on x-ray 2/05  . Hiatal hernia     hx w repair  . Urinary incontinence   . Urinary tract bacterial infections   . Atrial fibrillation Sept 12, 2005    new onset  . Right renal atrophy 2005  . Pacemaker   . H/O Clostridium difficile infection   . Macular degeneration   . Diverticulosis    Past Surgical History  Procedure Laterality Date  . Repair of hiatal hernia and tube, gastrostomy.  08/06/1999  . Total knee  arthroplasty  10/13/2003    Right  . Laparoscopic repair of ventral incisional hernia with  04/21/2000  . Colonoscopy  02/09/2012    Procedure: COLONOSCOPY;  Surgeon: Rachael Fee, MD;  Location: [facility];  Service: Endoscopy;  Laterality: N/A;   Social History:   reports that she has never smoked. She has never used smokeless tobacco. She reports that she does not drink alcohol or use illicit drugs.  Family History  Problem Relation Age of Onset  . Aneurysm Father   . Cancer Daughter     Breast and lung cancer  . Hypertension Son   . Hyperlipidemia Son     Medications: reviewed at [facility]                          Physical Exam: Physical Exam  Constitutional: She is oriented to person, place, and time. She appears well-developed and well-nourished. No distress.  HENT:  Head: Normocephalic and atraumatic.  Right Ear: Tympanic membrane normal. No tenderness. No mastoid tenderness. Decreased hearing is noted.  Left Ear: Tympanic membrane normal. No tenderness. No mastoid tenderness. Decreased hearing is noted.  Impacted ear wax removed with curette.   Eyes: Conjunctivae and EOM are normal. Pupils are equal, round, and reactive to light.  Neck: Normal range of motion. Neck supple. No JVD present. No thyromegaly present.  Cardiovascular: Normal rate.  An irregular rhythm present.  Murmur heard.  Systolic murmur is present with a grade of 2/6  Left upper chest pacemaker.   Pulmonary/Chest: Effort normal. She has no wheezes. She has rales (bibasilar dry rales). She exhibits no tenderness.  Abdominal: Soft. Bowel sounds are normal. There is no tenderness.  Musculoskeletal: Normal range of motion. She exhibits no edema (BLE. L>R left ankle. resolved).  Lymphadenopathy:    She has no cervical adenopathy.  Neurological: She is alert and oriented to person, place, and time. She has normal reflexes. No cranial nerve deficit. She exhibits normal muscle tone. Coordination normal.   Skin: Skin is warm and dry. No rash noted. She is not diaphoretic. Erythema: bilateral lower leg pigmentation.  S/p right face skin lesion removal and treated with Septra DS for total 3 weeks for MRSA  Psychiatric: Her speech is normal. Her affect is not angry, not blunt and not inappropriate. She is not aggressive. Thought content is not paranoid and not delusional. Cognition and memory are impaired. She expresses impulsivity. She does not express inappropriate judgment. She exhibits a depressed mood. She exhibits abnormal recent memory.    Filed Vitals:   08/24/12 1052  BP: 106/60  Pulse: 74  Temp: 96.2 F (35.7 C)  TempSrc: Tympanic  Resp: 18      Labs reviewed: Basic Metabolic Panel:  Recent Labs  09/81/19 0457 02/08/12 0610 02/09/12 0500 06/04/12 06/26/12 07/26/12  NA 140 142 144 141 138  --   K 3.9 3.5 3.8 4.1 4.0  --   CL 109 109 111  --   --   --   CO2 24 24 24   --   --   --   GLUCOSE 86 97 90  --   --   --   BUN 21 15 16 15 10   --   CREATININE 0.90 0.90 0.92 1.1 0.8  --   CALCIUM 8.2* 8.9 8.8  --   --   --   TSH  --   --   --   --   --  11.28*   Liver Function Tests:  Recent Labs  02/07/12 0457  AST 11  ALT 5  ALKPHOS 65  BILITOT 0.4  PROT 5.1*  ALBUMIN 2.3*    CBC:  Recent Labs  02/06/12 0815  02/08/12 0610 02/08/12 1942 02/09/12 0540 06/11/12 06/26/12 07/26/12  WBC 5.7  < > 5.8 7.1 6.4 4.4 7.7 6.1  NEUTROABS 3.5  --   --   --   --   --   --   --   HGB 11.5*  < > 9.9* 10.0* 9.3* 11.6* 13.4 11.9*  HCT 35.7*  < > 31.5* 31.6* 29.8* 36 41 36  MCV 91.5  < > 92.9 92.7 94.3  --   --   --   PLT 196  < > 197 185 172 141* 227 147*  < > = values in this interval not displayed.      Assessment/Plan SYNCOPE 3x in the last month, Clonidine was decreased to 0.1mg  since 08/10/12, Furosemide 20mg  06/20/12 for BLE edam, apparently she is experiencing orthostatic Bp changes while on Commode--will hold Lasix, encourage oral fluid intake, obtain CBC and CMP.  F/u Cardiology for pacemaker and cardiac etiology. No focal neurological deficit except the patient appears sleepy this am.   Acute blood loss anemia Check CBC  Hypothyroidism Levothyroxine 121mcg(was ) since 05/01/12 due to TSH 0.172 04/30/12 -f/u TSH 07/26/12 11.284--increase Levothyroxine to po daily, update TSH in 6 weeks.             Depression Sad facial looks, takes Lexapro 5mg            CHF (congestive heart failure) Resolved on lasix 20mg  started 06/20/12--weights down from 115 to 110-continue to monitor wt. Hold Furosemide for now.                 Family/ Staff Communication: observe the patient, encourage oral fluid intake.   Goals of Care: SNF  Labs/tests ordered: CBC and CMP

## 2012-08-24 NOTE — Assessment & Plan Note (Signed)
Resolved on lasix 20mg  started 06/20/12--weights down from 115 to 110-continue to monitor wt. Hold Furosemide for now.

## 2012-08-24 NOTE — Telephone Encounter (Signed)
New prob  Daughter said she spoke with Dr Terressa Koyanagi last night about her mom. She said that she has had 4 episodes of going out where her BP has went very low. She said last night she had an extremely big bm and went out. She said she was unresponsive and did not have any O2 reading for about a minute. She said it took 5 minutes for her to come to.  She said that Dr Terressa Koyanagi said he would get in contact with Dr Graciela Husbands and call her this morning and,  she said was just checking in to see if they have came up with anything for her mom.

## 2012-08-24 NOTE — Assessment & Plan Note (Signed)
Levothyroxine 100mcg(was 125mcg) since 05/01/12 due to TSH 0.172 04/30/12 -f/u TSH 07/26/12 11.284--increase Levothyroxine to 112mcg po daily, update TSH in 6 weeks.          

## 2012-08-24 NOTE — Assessment & Plan Note (Signed)
Sad facial looks, takes Lexapro 5mg                        

## 2012-08-24 NOTE — Assessment & Plan Note (Signed)
Check CBC 

## 2012-08-27 LAB — BASIC METABOLIC PANEL
BUN: 42 mg/dL — AB (ref 4–21)
Glucose: 82 mg/dL

## 2012-08-29 ENCOUNTER — Encounter: Payer: Self-pay | Admitting: Internal Medicine

## 2012-09-03 LAB — CBC AND DIFFERENTIAL
HCT: 35 % — AB (ref 36–46)
Platelets: 140 10*3/uL — AB (ref 150–399)

## 2012-09-03 LAB — TSH: TSH: 0.26 u[IU]/mL — AB (ref 0.41–5.90)

## 2012-09-04 ENCOUNTER — Encounter: Payer: Self-pay | Admitting: Nurse Practitioner

## 2012-09-04 ENCOUNTER — Non-Acute Institutional Stay (SKILLED_NURSING_FACILITY): Payer: Medicare Other | Admitting: Nurse Practitioner

## 2012-09-04 DIAGNOSIS — I4891 Unspecified atrial fibrillation: Secondary | ICD-10-CM

## 2012-09-04 DIAGNOSIS — D62 Acute posthemorrhagic anemia: Secondary | ICD-10-CM

## 2012-09-04 DIAGNOSIS — E039 Hypothyroidism, unspecified: Secondary | ICD-10-CM

## 2012-09-04 DIAGNOSIS — R55 Syncope and collapse: Secondary | ICD-10-CM

## 2012-09-04 DIAGNOSIS — I5040 Unspecified combined systolic (congestive) and diastolic (congestive) heart failure: Secondary | ICD-10-CM

## 2012-09-04 DIAGNOSIS — F329 Major depressive disorder, single episode, unspecified: Secondary | ICD-10-CM

## 2012-09-04 DIAGNOSIS — F3289 Other specified depressive episodes: Secondary | ICD-10-CM

## 2012-09-04 DIAGNOSIS — G3184 Mild cognitive impairment, so stated: Secondary | ICD-10-CM

## 2012-09-04 NOTE — Assessment & Plan Note (Signed)
Resolved on lasix 20mg  started 06/20/12--weights down from 115 to 110-continue to monitor wt weekly. Hold Furosemide due to her syncope episodes. Trace edema seen in BLE especially left ankle.

## 2012-09-04 NOTE — Assessment & Plan Note (Signed)
Resides at SNF, takes Aricept, progressed rapidly in the past year.    

## 2012-09-04 NOTE — Assessment & Plan Note (Signed)
Levothyroxine 181mcg(was ) since 05/01/12 due to TSH 0.172 04/30/12 -f/u TSH 07/26/12 11.284--increase Levothyroxine to po daily, updated TSH in 6 weeks-0.261 09/03/12--decrease Levothyroxine to daily 09/04/12--f/u TSH in 8 weeks.

## 2012-09-04 NOTE — Assessment & Plan Note (Signed)
3x in the last month, Clonidine was decreased to 0.1mg  since 08/10/12, Furosemide 20mg  06/20/12 for BLE edam, apparently she is experiencing orthostatic Bp changes while on Commode--no further episodes since held Lasix, encourage oral fluid intake.

## 2012-09-04 NOTE — Progress Notes (Signed)
Patient ID: [name], female   DOB: 1917-09-10, 79 y.o.   MRN: [id number] Code Status: DNR  Allergies  Allergen Reactions  . Asacol (Mesalamine)   . Aspirin Other (See Comments)    Per mar  . Butalbital-Asa-Caff-Codeine   . Codeine Other (See Comments)    Per mar    Chief Complaint  Patient presents with  . Medical Managment of Chronic Issues    HPI: Patient is a 79 y.o. female seen in the SNF at [facility] today for evaluation of her chronic medical conditions.  Problem List Items Addressed This Visit   Acute blood loss anemia - Primary     Hgb normalized 06/26/12--dc'd Fe. Hgb 11.7 09/03/12. Continued Folic acid and B12.           Atrial fibrillation     Rate controlled, takes Dig and Coumadin                CHF (congestive heart failure)     Resolved on lasix 20mg  started 06/20/12--weights down from 115 to 110-continue to monitor wt weekly. Hold Furosemide due to her syncope episodes. Trace edema seen in BLE especially left ankle.                   Depression     Sad facial looks, takes Lexapro 5mg                Hypothyroidism     Levothyroxine 165mcg(was ) since 05/01/12 due to TSH 0.172 04/30/12 -f/u TSH 07/26/12 11.284--increase Levothyroxine to po daily, updated TSH in 6 weeks-0.261 09/03/12--decrease Levothyroxine to daily 09/04/12--f/u TSH in 8 weeks.                 Mild cognitive impairment     Resides at SNF, takes Aricept, progressed rapidly in the past year.             SYNCOPE     3x in the last month, Clonidine was decreased to 0.1mg  since 08/10/12, Furosemide 20mg  06/20/12 for BLE edam, apparently she is experiencing orthostatic Bp changes while on Commode--no further episodes since held Lasix, encourage oral fluid intake.          Review of Systems:  Review of Systems  Constitutional: Negative for fever, chills, weight loss, malaise/fatigue and diaphoresis.  HENT:  Negative for congestion, sore throat, neck pain and ear discharge. Hearing loss: ear wax removed manually.    Eyes: Negative for pain, discharge and redness.  Respiratory: Negative for cough, sputum production and wheezing.   Cardiovascular: Positive for leg swelling (better in the LLE) and PND. Negative for chest pain, palpitations, orthopnea and claudication.  Gastrointestinal: Negative for heartburn, nausea, vomiting, abdominal pain, diarrhea, constipation and blood in stool.       Pill dysphagia  Genitourinary: Positive for frequency (incontinent of bladder). Negative for dysuria, urgency, hematuria and flank pain.  Musculoskeletal: Positive for joint pain. Negative for myalgias, back pain and falls.  Skin: Negative for itching and rash.       Chronic BLE venous insufficiency. S/p right face skin lesion removal-healed.  Neurological: Positive for weakness (generalized). Negative for dizziness, tingling, tremors, sensory change, speech change, focal weakness, seizures and loss of consciousness.  Endo/Heme/Allergies: Negative for environmental allergies and polydipsia. Bruises/bleeds easily.  Psychiatric/Behavioral: Positive for depression and memory loss. Negative for suicidal ideas, hallucinations and substance abuse. The patient is not nervous/anxious and does not have insomnia.      Past Medical History  Diagnosis Date  . Hypertension   . Hypothyroidism   . Osteoarthritis   . Osteoporosis   . Diverticulosis   . Chronic diarrhea   . Macular degeneration   . Kyphosis     of thoracic spine  . History of GI diverticular bleed Sept 3, 2003  . Toxic goiter     hx w subsequent ablation and hypothyroid state  . Mild cognitive disorder   . Bronchiectasis     left lower lobe noted on x-ray 2/05  . Hiatal hernia     hx w repair  . Urinary incontinence   . Urinary tract bacterial infections   . Atrial fibrillation Sept 12, 2005    new onset  . Right renal atrophy 2005  . Pacemaker    . H/O Clostridium difficile infection   . Macular degeneration   . Diverticulosis    Past Surgical History  Procedure Laterality Date  . Repair of hiatal hernia and tube, gastrostomy.  08/06/1999  . Total knee arthroplasty  10/13/2003    Right  . Laparoscopic repair of ventral incisional hernia with  04/21/2000  . Colonoscopy  02/09/2012    Procedure: COLONOSCOPY;  Surgeon: Rachael Fee, MD;  Location: [facility];  Service: Endoscopy;  Laterality: N/A;   Social History:   reports that she has never smoked. She has never used smokeless tobacco. She reports that she does not drink alcohol or use illicit drugs.  Family History  Problem Relation Age of Onset  . Aneurysm Father   . Cancer Daughter     Breast and lung cancer  . Hypertension Son   . Hyperlipidemia Son     Medications: Reviewed at [facility]   Physical Exam: Physical Exam  Constitutional: She is oriented to person, place, and time. She appears well-developed and well-nourished. No distress.  HENT:  Head: Normocephalic and atraumatic.  Right Ear: Tympanic membrane normal. No tenderness. No mastoid tenderness. Decreased hearing is noted.  Left Ear: Tympanic membrane normal. No tenderness. No mastoid tenderness. Decreased hearing is noted.  Impacted ear wax removed with curette.   Eyes: Conjunctivae and EOM are normal. Pupils are equal, round, and reactive to light.  Neck: Normal range of motion. Neck supple. No JVD present. No thyromegaly present.  Cardiovascular: Normal rate.  An irregular rhythm present.  Murmur heard.  Systolic murmur is present with a grade of 2/6  Left upper chest pacemaker.   Pulmonary/Chest: Effort normal. She has no wheezes. She has rales (bibasilar dry rales). She exhibits no tenderness.  Abdominal: Soft. Bowel sounds are normal. There is no tenderness.  Musculoskeletal: Normal range of motion. She exhibits edema (BLE. L>R left ankle. ).  Lymphadenopathy:    She has no cervical adenopathy.   Neurological: She is alert and oriented to person, place, and time. She has normal reflexes. No cranial nerve deficit. She exhibits normal muscle tone. Coordination normal.  Skin: Skin is warm and dry. No rash noted. She is not diaphoretic. Erythema: bilateral lower leg pigmentation.  S/p right face skin lesion removal and healed.   Psychiatric: Her speech is normal. Her affect is not angry, not blunt and not inappropriate. She is not aggressive. Thought content is not paranoid and not delusional. Cognition and memory are impaired. She expresses impulsivity. She does not express inappropriate judgment. She exhibits a depressed mood. She exhibits abnormal recent memory.    Filed Vitals:   09/04/12 1500  BP: 113/84  Pulse: 59  Temp: 97.4 F (36.3 C)  TempSrc:  Tympanic  Resp: 29      Labs reviewed: Basic Metabolic Panel:  Recent Labs  16/10/96 0457 02/08/12 0610 02/09/12 0500 06/04/12 06/26/12 07/26/12 08/27/12 09/03/12  NA 140 142 144 141 138  --  140  --   K 3.9 3.5 3.8 4.1 4.0  --  4.8  --   CL 109 109 111  --   --   --   --   --   CO2 24 24 24   --   --   --   --   --   GLUCOSE 86 97 90  --   --   --   --   --   BUN 21 15 16 15 10   --  42*  --   CREATININE 0.90 0.90 0.92 1.1 0.8  --  1.4*  --   CALCIUM 8.2* 8.9 8.8  --   --   --   --   --   TSH  --   --   --   --   --  11.28*  --  0.26*   Liver Function Tests:  Recent Labs  02/07/12 0457  AST 11  ALT 5  ALKPHOS 65  BILITOT 0.4  PROT 5.1*  ALBUMIN 2.3*    CBC:  Recent Labs  02/06/12 0815  02/08/12 0610 02/08/12 1942 02/09/12 0540  06/26/12 07/26/12 09/03/12  WBC 5.7  < > 5.8 7.1 6.4  < > 7.7 6.1 5.0  NEUTROABS 3.5  --   --   --   --   --   --   --   --   HGB 11.5*  < > 9.9* 10.0* 9.3*  < > 13.4 11.9* 11.8*  HCT 35.7*  < > 31.5* 31.6* 29.8*  < > 41 36 35*  MCV 91.5  < > 92.9 92.7 94.3  --   --   --   --   PLT 196  < > 197 185 172  < > 227 147* 140*  < > = values in this interval not  displayed.      Assessment/Plan Acute blood loss anemia Hgb normalized 06/26/12--dc'd Fe. Hgb 11.7 09/03/12. Continued Folic acid and B12.         CHF (congestive heart failure) Resolved on lasix 20mg  started 06/20/12--weights down from 115 to 110-continue to monitor wt weekly. Hold Furosemide due to her syncope episodes. Trace edema seen in BLE especially left ankle.                 SYNCOPE 3x in the last month, Clonidine was decreased to 0.1mg  since 08/10/12, Furosemide 20mg  06/20/12 for BLE edam, apparently she is experiencing orthostatic Bp changes while on Commode--no further episodes since held Lasix, encourage oral fluid intake.     Hypothyroidism Levothyroxine 122mcg(was ) since 05/01/12 due to TSH 0.172 04/30/12 -f/u TSH 07/26/12 11.284--increase Levothyroxine to po daily, updated TSH in 6 weeks-0.261 09/03/12--decrease Levothyroxine to daily 09/04/12--f/u TSH in 8 weeks.               Atrial fibrillation Rate controlled, takes Dig and Coumadin              Depression Sad facial looks, takes Lexapro 5mg              Mild cognitive impairment Resides at SNF, takes Aricept, progressed rapidly in the past year.             Family/ Staff Communication: observe the patient and  monitor her weight/edema.   Goals of Care: SNF  Labs/tests ordered: none

## 2012-09-04 NOTE — Assessment & Plan Note (Signed)
Sad facial looks, takes Lexapro 5mg                        

## 2012-09-04 NOTE — Assessment & Plan Note (Signed)
Hgb normalized 06/26/12--dc'd Fe. Hgb 11.7 09/03/12. Continued Folic acid and B12.

## 2012-09-04 NOTE — Assessment & Plan Note (Signed)
Rate controlled, takes Dig and Coumadin                 

## 2012-09-13 ENCOUNTER — Encounter: Payer: Self-pay | Admitting: Internal Medicine

## 2012-09-13 NOTE — Progress Notes (Signed)
  Subjective:    Patient ID: [name], female    DOB: 10-16-1917, 79 y.o.   MRN: [id number]  HPI    Review of Systems     Objective:BP 148/66  Pulse 64  Temp(Src) 97 F (36.1 C)  Resp 18  Wt 114 lb (51.71 kg)  BMI 18.97 kg/m2    Physical Exam   08/27/2012 CMP: BUN 42, creatinine 1.36 09/03/2012 CBC: Hgb 11.7  INR 1.44  TSH 0.261     Assessment & Plan:   This encounter was created in error - please disregard.

## 2012-09-28 ENCOUNTER — Encounter: Payer: Self-pay | Admitting: Nurse Practitioner

## 2012-09-28 ENCOUNTER — Non-Acute Institutional Stay (SKILLED_NURSING_FACILITY): Payer: Medicare Other | Admitting: Nurse Practitioner

## 2012-09-28 DIAGNOSIS — D62 Acute posthemorrhagic anemia: Secondary | ICD-10-CM

## 2012-09-28 DIAGNOSIS — E039 Hypothyroidism, unspecified: Secondary | ICD-10-CM

## 2012-09-28 DIAGNOSIS — F329 Major depressive disorder, single episode, unspecified: Secondary | ICD-10-CM

## 2012-09-28 DIAGNOSIS — R609 Edema, unspecified: Secondary | ICD-10-CM

## 2012-09-28 DIAGNOSIS — I509 Heart failure, unspecified: Secondary | ICD-10-CM

## 2012-09-28 DIAGNOSIS — I10 Essential (primary) hypertension: Secondary | ICD-10-CM

## 2012-09-28 DIAGNOSIS — I4891 Unspecified atrial fibrillation: Secondary | ICD-10-CM

## 2012-09-28 NOTE — Progress Notes (Signed)
Patient ID: [name], female   DOB: Feb 20, 1917, 79 y.o.   MRN: [id number] Code Status: DNR  Allergies  Allergen Reactions  . Asacol [Mesalamine]   . Aspirin Other (See Comments)    Per mar  . Butalbital-Asa-Caff-Codeine   . Codeine Other (See Comments)    Per mar    Chief Complaint  Patient presents with  . Medical Managment of Chronic Issues    weight gain and ankle/feet edema.     HPI: Patient is a 79 y.o. female seen in the SNF at [facility] today for evaluation of her weights/ankle edema and chronic medical conditions.  Problem List Items Addressed This Visit   Acute blood loss anemia     Stable, Hgb 11.7 09/03/12     Atrial fibrillation     Rate controlled, takes Dig and Coumadin                  CHF (congestive heart failure)     Risk for decompensation since her weights trending up and edema in ankles/feet worse. Monitor wt and administer prn Furosemide and observe the patient.                   Depression     Sad facial looks, takes Lexapro 5mg                  Edema - Primary (Chronic)     Off Lasix since 08/24/12 due to Bun/creat 56/1.75 and syncope like episodes.  Left ankle edema is more pronounced since then, weights up about 3 Ibs, but no orthopnea, cough, O2 desaturation, no change of bibasilar rales. Will have prn Lasix 20mg  prn for Weight gain 3-5Ibs/week and will continue to monitor her weight weekly. F/u BMP in one week.               HYPERTENSION, BENIGN     Elevated mildly, takes Clonidine 0.1mg (was reduced to 0.2mg  06/29/12), Enalapril 20mg  bid, Metoprolol 100mg  bid.       Hypothyroidism     Levothyroxine 173mcg(was ) since 05/01/12 due to TSH 0.172 04/30/12 -f/u TSH 07/26/12 11.284--increase Levothyroxine to po daily, updated TSH in 6 weeks-0.261 09/03/12--decrease Levothyroxine to daily 09/04/12--f/u TSH in 8 weeks.                      Review of Systems:   Review of Systems  Constitutional: Negative for fever, chills, weight loss, malaise/fatigue and diaphoresis.  HENT: Positive for hearing loss. Negative for congestion, sore throat, neck pain and ear discharge.   Eyes: Negative for pain, discharge and redness.  Respiratory: Negative for cough, sputum production and wheezing.   Cardiovascular: Positive for PND. Negative for chest pain, palpitations, orthopnea and claudication. Leg swelling: ankle/feet edeam, L>R.  Gastrointestinal: Negative for heartburn, nausea, vomiting, abdominal pain, diarrhea, constipation and blood in stool.       Pill dysphagia  Genitourinary: Positive for frequency (incontinent of bladder). Negative for dysuria, urgency, hematuria and flank pain.  Musculoskeletal: Positive for joint pain. Negative for myalgias, back pain and falls.  Skin: Negative for itching and rash.       Chronic BLE venous insufficiency.   Neurological: Negative for dizziness, tingling, tremors, sensory change, speech change, focal weakness, seizures, loss of consciousness and weakness.  Endo/Heme/Allergies: Negative for environmental allergies and polydipsia. Bruises/bleeds easily.  Psychiatric/Behavioral: Positive for depression and memory loss. Negative for suicidal ideas, hallucinations and substance abuse. The patient is not nervous/anxious  and does not have insomnia.      Past Medical History  Diagnosis Date  . Hypertension   . Hypothyroidism   . Osteoarthritis   . Osteoporosis   . Diverticulosis   . Chronic diarrhea   . Macular degeneration   . Kyphosis     of thoracic spine  . History of GI diverticular bleed Sept 3, 2003  . Toxic goiter     hx w subsequent ablation and hypothyroid state  . Mild cognitive disorder   . Bronchiectasis     left lower lobe noted on x-ray 2/05  . Hiatal hernia     hx w repair  . Urinary incontinence   . Urinary tract bacterial infections   . Atrial fibrillation Sept 12, 2005    new onset  .  Right renal atrophy 2005  . Pacemaker   . H/O Clostridium difficile infection   . Macular degeneration   . Diverticulosis    Past Surgical History  Procedure Laterality Date  . Repair of hiatal hernia and tube, gastrostomy.  08/06/1999  . Total knee arthroplasty  10/13/2003    Right  . Laparoscopic repair of ventral incisional hernia with  04/21/2000  . Colonoscopy  02/09/2012    Procedure: COLONOSCOPY;  Surgeon: Rachael Fee, MD;  Location: [facility];  Service: Endoscopy;  Laterality: N/A;   Social History:   reports that she has never smoked. She has never used smokeless tobacco. She reports that she does not drink alcohol or use illicit drugs.  Family History  Problem Relation Age of Onset  . Aneurysm Father   . Cancer Daughter     Breast and lung cancer  . Hypertension Son   . Hyperlipidemia Son     Medications: Reviewed at [facility]   Physical Exam: Physical Exam  Constitutional: She is oriented to person, place, and time. She appears well-developed and well-nourished. No distress.  HENT:  Head: Normocephalic and atraumatic.  Right Ear: Tympanic membrane normal. No tenderness. No mastoid tenderness. Decreased hearing is noted.  Left Ear: Tympanic membrane normal. No tenderness. No mastoid tenderness. Decreased hearing is noted.  Eyes: Conjunctivae and EOM are normal. Pupils are equal, round, and reactive to light.  Neck: Normal range of motion. Neck supple. No JVD present. No thyromegaly present.  Cardiovascular: Normal rate.  An irregular rhythm present.  Murmur heard.  Systolic murmur is present with a grade of 2/6  Left upper chest pacemaker.   Pulmonary/Chest: Effort normal. She has no wheezes. She has rales (bibasilar dry rales). She exhibits no tenderness.  Abdominal: Soft. Bowel sounds are normal. There is no tenderness.  Musculoskeletal: Normal range of motion. Edema: BLE. L>R left ankle.   Lymphadenopathy:    She has no cervical adenopathy.  Neurological: She  is alert and oriented to person, place, and time. She has normal reflexes. No cranial nerve deficit. She exhibits normal muscle tone. Coordination normal.  Skin: Skin is warm and dry. No rash noted. She is not diaphoretic. Erythema: bilateral lower leg pigmentation.  Psychiatric: Her speech is normal. Her affect is not angry, not blunt and not inappropriate. She is not aggressive. Thought content is not paranoid and not delusional. Cognition and memory are impaired. She expresses impulsivity. She does not express inappropriate judgment. She exhibits a depressed mood. She exhibits abnormal recent memory.    Filed Vitals:   09/28/12 1113  BP: 172/98  Pulse: 66  Temp: 98 F (36.7 C)  TempSrc: Tympanic  Resp: 18  Labs reviewed: Basic Metabolic Panel:  Recent Labs  16/10/96 0457 02/08/12 0610 02/09/12 0500 06/04/12 06/26/12 07/26/12 08/27/12 09/03/12  NA 140 142 144 141 138  --  140  --   K 3.9 3.5 3.8 4.1 4.0  --  4.8  --   CL 109 109 111  --   --   --   --   --   CO2 24 24 24   --   --   --   --   --   GLUCOSE 86 97 90  --   --   --   --   --   BUN 21 15 16 15 10   --  42*  --   CREATININE 0.90 0.90 0.92 1.1 0.8  --  1.4*  --   CALCIUM 8.2* 8.9 8.8  --   --   --   --   --   TSH  --   --   --   --   --  11.28*  --  0.26*   Liver Function Tests:  Recent Labs  02/07/12 0457  AST 11  ALT 5  ALKPHOS 65  BILITOT 0.4  PROT 5.1*  ALBUMIN 2.3*    CBC:  Recent Labs  02/06/12 0815  02/08/12 0610 02/08/12 1942 02/09/12 0540  06/26/12 07/26/12 09/03/12  WBC 5.7  < > 5.8 7.1 6.4  < > 7.7 6.1 5.0  NEUTROABS 3.5  --   --   --   --   --   --   --   --   HGB 11.5*  < > 9.9* 10.0* 9.3*  < > 13.4 11.9* 11.8*  HCT 35.7*  < > 31.5* 31.6* 29.8*  < > 41 36 35*  MCV 91.5  < > 92.9 92.7 94.3  --   --   --   --   PLT 196  < > 197 185 172  < > 227 147* 140*  < > = values in this interval not displayed.      Assessment/Plan Edema Off Lasix since 08/24/12 due to Bun/creat  56/1.75 and syncope like episodes.  Left ankle edema is more pronounced since then, weights up about 3 Ibs, but no orthopnea, cough, O2 desaturation, no change of bibasilar rales. Will have prn Lasix 20mg  prn for Weight gain 3-5Ibs/week and will continue to monitor her weight weekly. F/u BMP in one week.             HYPERTENSION, BENIGN Elevated mildly, takes Clonidine 0.1mg (was reduced to 0.2mg  06/29/12), Enalapril 20mg  bid, Metoprolol 100mg  bid.     Atrial fibrillation Rate controlled, takes Dig and Coumadin                Acute blood loss anemia Stable, Hgb 11.7 09/03/12   Hypothyroidism Levothyroxine 143mcg(was ) since 05/01/12 due to TSH 0.172 04/30/12 -f/u TSH 07/26/12 11.284--increase Levothyroxine to po daily, updated TSH in 6 weeks-0.261 09/03/12--decrease Levothyroxine to daily 09/04/12--f/u TSH in 8 weeks.                 Depression Sad facial looks, takes Lexapro 5mg                CHF (congestive heart failure) Risk for decompensation since her weights trending up and edema in ankles/feet worse. Monitor wt and administer prn Furosemide and observe the patient.                   Family/ Staff Communication:  observe the patient and monitor her weight/edema.   Goals of Care: SNF  Labs/tests ordered: BMP one week.

## 2012-09-28 NOTE — Assessment & Plan Note (Signed)
Elevated mildly, takes Clonidine 0.1mg(was reduced to 0.2mg 06/29/12), Enalapril 20mg bid, Metoprolol 100mg bid.                

## 2012-09-28 NOTE — Assessment & Plan Note (Signed)
Risk for decompensation since her weights trending up and edema in ankles/feet worse. Monitor wt and administer prn Furosemide and observe the patient.

## 2012-09-28 NOTE — Assessment & Plan Note (Signed)
Rate controlled, takes Dig and Coumadin                 

## 2012-09-28 NOTE — Assessment & Plan Note (Signed)
Levothyroxine 100mcg(was 125mcg) since 05/01/12 due to TSH 0.172 04/30/12 -f/u TSH 07/26/12 11.284--increase Levothyroxine to 112mcg po daily, updated TSH in 6 weeks-0.261 09/03/12--decrease Levothyroxine to 100mcg daily 09/04/12--f/u TSH in 8 weeks.              

## 2012-09-28 NOTE — Assessment & Plan Note (Signed)
Off Lasix since 08/24/12 due to Bun/creat 56/1.75 and syncope like episodes.  Left ankle edema is more pronounced since then, weights up about 3 Ibs, but no orthopnea, cough, O2 desaturation, no change of bibasilar rales. Will have prn Lasix 20mg  prn for Weight gain 3-5Ibs/week and will continue to monitor her weight weekly. F/u BMP in one week.

## 2012-09-28 NOTE — Assessment & Plan Note (Signed)
Sad facial looks, takes Lexapro 5mg                        

## 2012-09-28 NOTE — Assessment & Plan Note (Signed)
Stable, Hgb 11.7 09/03/12

## 2012-10-04 ENCOUNTER — Encounter: Payer: Medicare Other | Admitting: Cardiology

## 2012-10-04 LAB — BASIC METABOLIC PANEL
BUN: 20 mg/dL (ref 4–21)
Glucose: 85 mg/dL

## 2012-10-10 ENCOUNTER — Encounter: Payer: Self-pay | Admitting: Cardiology

## 2012-10-29 LAB — PROTIME-INR: Protime: 24.4 seconds — AB (ref 10.0–13.8)

## 2012-10-30 ENCOUNTER — Non-Acute Institutional Stay (SKILLED_NURSING_FACILITY): Payer: Medicare Other | Admitting: Nurse Practitioner

## 2012-10-30 ENCOUNTER — Encounter: Payer: Self-pay | Admitting: Nurse Practitioner

## 2012-10-30 DIAGNOSIS — F329 Major depressive disorder, single episode, unspecified: Secondary | ICD-10-CM

## 2012-10-30 DIAGNOSIS — F32A Depression, unspecified: Secondary | ICD-10-CM

## 2012-10-30 DIAGNOSIS — I10 Essential (primary) hypertension: Secondary | ICD-10-CM

## 2012-10-30 DIAGNOSIS — I4891 Unspecified atrial fibrillation: Secondary | ICD-10-CM

## 2012-10-30 DIAGNOSIS — G3184 Mild cognitive impairment, so stated: Secondary | ICD-10-CM

## 2012-10-30 DIAGNOSIS — Z7901 Long term (current) use of anticoagulants: Secondary | ICD-10-CM

## 2012-10-30 DIAGNOSIS — R609 Edema, unspecified: Secondary | ICD-10-CM

## 2012-10-30 DIAGNOSIS — E039 Hypothyroidism, unspecified: Secondary | ICD-10-CM

## 2012-10-30 NOTE — Assessment & Plan Note (Signed)
Sad facial looks, takes Lexapro 5mg                        

## 2012-10-30 NOTE — Assessment & Plan Note (Signed)
Levothyroxine M-F, Sat + Sun-F/u TSH in 8 weeks. Was on daily and TSH 5.304 10/29/12

## 2012-10-30 NOTE — Assessment & Plan Note (Signed)
INR 2.29 10/29/12-therapeutic, continue Coumadin 1mg  and 2mg  alternating dose, f/u PT/INR in one month.

## 2012-10-30 NOTE — Assessment & Plan Note (Signed)
Off Lasix since 08/24/12 due to Bun/creat 56/1.75 and syncope like episodes.  Left ankle edema is more pronounced since then. Continue to monitor weights 07/2012 # 117, 08/2012 #114, 10/2012 # 117

## 2012-10-30 NOTE — Assessment & Plan Note (Signed)
Rate controlled, takes Dig and Coumadin

## 2012-10-30 NOTE — Assessment & Plan Note (Signed)
Resides at SNF, takes Aricept, progressed rapidly in the past year.    

## 2012-10-30 NOTE — Progress Notes (Signed)
Patient ID: [name], female   DOB: Aug 26, 1917, 79 y.o.   MRN: [id number] Code Status: DNR  Allergies  Allergen Reactions  . Asacol [Mesalamine]   . Aspirin Other (See Comments)    Per mar  . Butalbital-Asa-Caff-Codeine   . Codeine Other (See Comments)    Per mar    Chief Complaint  Patient presents with  . Medical Managment of Chronic Issues    anticoagulation, thyroid.     HPI: Patient is a 79 y.o. female seen in the SNF at [facility] today for evaluation of her thyroid, manage anticoagulation, blood pressure, edema and chronic medical conditions.  Problem List Items Addressed This Visit   Atrial fibrillation     Rate controlled, takes Dig and Coumadin                    Chronic anticoagulation     INR 2.29 10/29/12-therapeutic, continue Coumadin 1mg  and 2mg  alternating dose, f/u PT/INR in one month.     Depression     Sad facial looks, takes Lexapro 5mg                  Edema (Chronic)     Off Lasix since 08/24/12 due to Bun/creat 56/1.75 and syncope like episodes.  Left ankle edema is more pronounced since then. Continue to monitor weights 07/2012 # 117, 08/2012 #114, 10/2012 # 117    HYPERTENSION, BENIGN     Elevated mildly, takes Clonidine 0.1mg (was reduced to 0.2mg  06/29/12), Enalapril 20mg  bid, Metoprolol 100mg  bid.         Hypothyroidism - Primary     Levothyroxine M-F, Sat + Sun-F/u TSH in 8 weeks. Was on daily and TSH 5.304 10/29/12                    Mild cognitive impairment     Resides at SNF, takes Aricept, progressed rapidly in the past year.                  Review of Systems:  Review of Systems  Constitutional: Negative for fever, chills, weight loss, malaise/fatigue and diaphoresis.  HENT: Positive for hearing loss. Negative for congestion, sore throat, neck pain and ear discharge.   Eyes: Negative for pain, discharge and redness.  Respiratory: Negative for cough,  sputum production and wheezing.   Cardiovascular: Positive for PND. Negative for chest pain, palpitations, orthopnea and claudication. Leg swelling: ankle/feet edeam, L>R.  Gastrointestinal: Negative for heartburn, nausea, vomiting, abdominal pain, diarrhea, constipation and blood in stool.       Pill dysphagia  Genitourinary: Positive for frequency (incontinent of bladder). Negative for dysuria, urgency, hematuria and flank pain.  Musculoskeletal: Positive for joint pain. Negative for myalgias, back pain and falls.  Skin: Negative for itching and rash.       Chronic BLE venous insufficiency.   Neurological: Negative for dizziness, tingling, tremors, sensory change, speech change, focal weakness, seizures, loss of consciousness and weakness.  Endo/Heme/Allergies: Negative for environmental allergies and polydipsia. Bruises/bleeds easily.  Psychiatric/Behavioral: Positive for depression and memory loss. Negative for suicidal ideas, hallucinations and substance abuse. The patient is not nervous/anxious and does not have insomnia.      Past Medical History  Diagnosis Date  . Hypertension   . Hypothyroidism   . Osteoarthritis   . Osteoporosis   . Diverticulosis   . Chronic diarrhea   . Macular degeneration   . Kyphosis     of thoracic  spine  . History of GI diverticular bleed Sept 3, 2003  . Toxic goiter     hx w subsequent ablation and hypothyroid state  . Mild cognitive disorder   . Bronchiectasis     left lower lobe noted on x-ray 2/05  . Hiatal hernia     hx w repair  . Urinary incontinence   . Urinary tract bacterial infections   . Atrial fibrillation Sept 12, 2005    new onset  . Right renal atrophy 2005  . Pacemaker   . H/O Clostridium difficile infection   . Macular degeneration   . Diverticulosis    Past Surgical History  Procedure Laterality Date  . Repair of hiatal hernia and tube, gastrostomy.  08/06/1999  . Total knee arthroplasty  10/13/2003    Right  .  Laparoscopic repair of ventral incisional hernia with  04/21/2000  . Colonoscopy  02/09/2012    Procedure: COLONOSCOPY;  Surgeon: Rachael Fee, MD;  Location: [facility];  Service: Endoscopy;  Laterality: N/A;   Social History:   reports that she has never smoked. She has never used smokeless tobacco. She reports that she does not drink alcohol or use illicit drugs.  Family History  Problem Relation Age of Onset  . Aneurysm Father   . Cancer Daughter     Breast and lung cancer  . Hypertension Son   . Hyperlipidemia Son     Medications: Reviewed at [facility]   Physical Exam: Physical Exam  Constitutional: She is oriented to person, place, and time. She appears well-developed and well-nourished. No distress.  HENT:  Head: Normocephalic and atraumatic.  Right Ear: Tympanic membrane normal. No tenderness. No mastoid tenderness. Decreased hearing is noted.  Left Ear: Tympanic membrane normal. No tenderness. No mastoid tenderness. Decreased hearing is noted.  Eyes: Conjunctivae and EOM are normal. Pupils are equal, round, and reactive to light.  Neck: Normal range of motion. Neck supple. No JVD present. No thyromegaly present.  Cardiovascular: Normal rate.  An irregular rhythm present.  Murmur heard.  Systolic murmur is present with a grade of 2/6  Left upper chest pacemaker.   Pulmonary/Chest: Effort normal. She has no wheezes. She has rales (bibasilar dry rales). She exhibits no tenderness.  Abdominal: Soft. Bowel sounds are normal. There is no tenderness.  Musculoskeletal: Normal range of motion. Edema: BLE. L>R left ankle.   Lymphadenopathy:    She has no cervical adenopathy.  Neurological: She is alert and oriented to person, place, and time. She has normal reflexes. No cranial nerve deficit. She exhibits normal muscle tone. Coordination normal.  Skin: Skin is warm and dry. No rash noted. She is not diaphoretic. Erythema: bilateral lower leg pigmentation.  Psychiatric: Her speech  is normal. Her affect is not angry, not blunt and not inappropriate. She is not aggressive. Thought content is not paranoid and not delusional. Cognition and memory are impaired. She expresses impulsivity. She does not express inappropriate judgment. She exhibits a depressed mood. She exhibits abnormal recent memory.    Filed Vitals:   10/30/12 1255  BP: 119/80  Pulse: 61  Temp: 97.2 F (36.2 C)  TempSrc: Tympanic  Resp: 18      Labs reviewed: Basic Metabolic Panel:  Recent Labs  16/10/96 0457 02/08/12 0610 02/09/12 0500  06/26/12 07/26/12 08/27/12 09/03/12 10/04/12 10/29/12  NA 140 142 144  < > 138  --  140  --  142  --   K 3.9 3.5 3.8  < > 4.0  --  4.8  --  4.5  --   CL 109 109 111  --   --   --   --   --   --   --   CO2 24 24 24   --   --   --   --   --   --   --   GLUCOSE 86 97 90  --   --   --   --   --   --   --   BUN 21 15 16   < > 10  --  42*  --  20  --   CREATININE 0.90 0.90 0.92  < > 0.8  --  1.4*  --  1.0  --   CALCIUM 8.2* 8.9 8.8  --   --   --   --   --   --   --   TSH  --   --   --   --   --  11.28*  --  0.26*  --  5.30  < > = values in this interval not displayed. Liver Function Tests:  Recent Labs  02/07/12 0457  AST 11  ALT 5  ALKPHOS 65  BILITOT 0.4  PROT 5.1*  ALBUMIN 2.3*    CBC:  Recent Labs  02/06/12 0815  02/08/12 0610 02/08/12 1942 02/09/12 0540  06/26/12 07/26/12 09/03/12  WBC 5.7  < > 5.8 7.1 6.4  < > 7.7 6.1 5.0  NEUTROABS 3.5  --   --   --   --   --   --   --   --   HGB 11.5*  < > 9.9* 10.0* 9.3*  < > 13.4 11.9* 11.8*  HCT 35.7*  < > 31.5* 31.6* 29.8*  < > 41 36 35*  MCV 91.5  < > 92.9 92.7 94.3  --   --   --   --   PLT 196  < > 197 185 172  < > 227 147* 140*  < > = values in this interval not displayed.      Assessment/Plan Hypothyroidism Levothyroxine M-F, Sat + Sun-F/u TSH in 8 weeks. Was on daily and TSH 5.304 10/29/12                  Chronic anticoagulation INR 2.29  10/29/12-therapeutic, continue Coumadin 1mg  and 2mg  alternating dose, f/u PT/INR in one month.   Depression Sad facial looks, takes Lexapro 5mg                Edema Off Lasix since 08/24/12 due to Bun/creat 56/1.75 and syncope like episodes.  Left ankle edema is more pronounced since then. Continue to monitor weights 07/2012 # 117, 08/2012 #114, 10/2012 # 117  HYPERTENSION, BENIGN Elevated mildly, takes Clonidine 0.1mg (was reduced to 0.2mg  06/29/12), Enalapril 20mg  bid, Metoprolol 100mg  bid.       Atrial fibrillation Rate controlled, takes Dig and Coumadin                  Mild cognitive impairment Resides at SNF, takes Aricept, progressed rapidly in the past year.               Family/ Staff Communication: observe the patient and monitor her weight/edema.   Goals of Care: SNF  Labs/tests ordered: TSH in 8 weeks.

## 2012-10-30 NOTE — Assessment & Plan Note (Signed)
Elevated mildly, takes Clonidine 0.1mg(was reduced to 0.2mg 06/29/12), Enalapril 20mg bid, Metoprolol 100mg bid.                

## 2012-11-02 ENCOUNTER — Encounter: Payer: Self-pay | Admitting: Cardiology

## 2012-11-20 ENCOUNTER — Encounter: Payer: Self-pay | Admitting: Cardiology

## 2012-11-20 ENCOUNTER — Encounter: Payer: Self-pay | Admitting: Nurse Practitioner

## 2012-11-20 ENCOUNTER — Ambulatory Visit (INDEPENDENT_AMBULATORY_CARE_PROVIDER_SITE_OTHER): Payer: Medicare Other | Admitting: Cardiology

## 2012-11-20 VITALS — BP 100/64 | HR 63 | Ht 65.0 in | Wt 117.0 lb

## 2012-11-20 DIAGNOSIS — I4891 Unspecified atrial fibrillation: Secondary | ICD-10-CM

## 2012-11-20 DIAGNOSIS — R001 Bradycardia, unspecified: Secondary | ICD-10-CM

## 2012-11-20 DIAGNOSIS — Z950 Presence of cardiac pacemaker: Secondary | ICD-10-CM

## 2012-11-20 DIAGNOSIS — I498 Other specified cardiac arrhythmias: Secondary | ICD-10-CM

## 2012-11-20 DIAGNOSIS — R5381 Other malaise: Secondary | ICD-10-CM

## 2012-11-20 DIAGNOSIS — R5383 Other fatigue: Secondary | ICD-10-CM

## 2012-11-20 LAB — PACEMAKER DEVICE OBSERVATION
BATTERY VOLTAGE: 2.96 V
BMOD-0002RV: 12
BRDY-0005RV: 50 {beats}/min
RV LEAD THRESHOLD: 1.5 V
VENTRICULAR PACING PM: 93

## 2012-11-20 MED ORDER — ENALAPRIL MALEATE 10 MG PO TABS: 10.0000 mg | ORAL_TABLET | Freq: Every day | ORAL | Status: DC

## 2012-11-20 NOTE — Progress Notes (Signed)
This encounter was created in error - please disregard.

## 2012-11-20 NOTE — Progress Notes (Signed)
ELECTROPHYSIOLOGY OFFICE NOTE  Patient ID: [name] MRN: [id number], DOB/AGE: Nov 17, 1917   Date of Visit: 11/20/2012  Primary Physician: [name], MD Primary Cardiologist: Berton Mount, MD Reason for Visit: EP/device follow-up  History of Present Illness  [name] is a 79 y.o. female with permanent AFib and bradycardia s/p single chamber PPM implantation who presents today for routine device follow-up. She is accompanied by her daughter who assists with history. Since last being seen in our clinic, she reports she is doing well and has no complaints. Her daughter states her only concern about her mother today is increasing fatigue. She feels her mom falls asleep too quickly. She was hospitalized earlier this year with AMS and was told her BP was too low. [name] has no complaints. She denies chest pain or shortness of breath. She denies palpitations, dizziness, near syncope or syncope. She denies LE swelling, orthopnea, PND or recent weight gain. She is compliant with her medications.  Past Medical History Past Medical History  Diagnosis Date  . Hypertension   . Hypothyroidism   . Osteoarthritis   . Osteoporosis   . Diverticulosis   . Chronic diarrhea   . Macular degeneration   . Kyphosis     of thoracic spine  . History of GI diverticular bleed Sept 3, 2003  . Toxic goiter     hx w subsequent ablation and hypothyroid state  . Mild cognitive disorder   . Bronchiectasis     left lower lobe noted on x-ray 2/05  . Hiatal hernia     hx w repair  . Urinary incontinence   . Urinary tract bacterial infections   . Atrial fibrillation Sept 12, 2005    new onset  . Right renal atrophy 2005  . Pacemaker   . H/O Clostridium difficile infection   . Macular degeneration   . Diverticulosis     Past Surgical History Past Surgical History  Procedure Laterality Date  . Repair of hiatal hernia and tube, gastrostomy.  08/06/1999  . Total knee arthroplasty   10/13/2003    Right  . Laparoscopic repair of ventral incisional hernia with  04/21/2000  . Colonoscopy  02/09/2012    Procedure: COLONOSCOPY;  Surgeon: Rachael Fee, MD;  Location: [facility];  Service: Endoscopy;  Laterality: N/A;    Allergies/Intolerances Allergies  Allergen Reactions  . Asacol [Mesalamine]   . Aspirin Other (See Comments)    Per mar  . Butalbital-Asa-Caff-Codeine   . Codeine Other (See Comments)    Per mar    Current Home Medications Current Outpatient Prescriptions  Medication Sig Dispense Refill  . acetaminophen (TYLENOL) 500 MG tablet Take 500 mg by mouth 2 (two) times daily.       . cloNIDine (CATAPRES) 0.2 MG tablet Take 0.2 mg by mouth daily.      . digoxin (LANOXIN) 0.125 MG tablet Take 125 mcg by mouth daily.        Marland Kitchen donepezil (ARICEPT) 10 MG tablet Take 10 mg by mouth at bedtime.       . enalapril (VASOTEC) 10 MG tablet Take 1 tablet (10 mg total) by mouth daily.  30 tablet  1  . folic acid (FOLVITE) 1 MG tablet Take 1 mg by mouth daily.      . furosemide (LASIX) 20 MG tablet Take 20 mg by mouth.      . levothyroxine (SYNTHROID, LEVOTHROID) 125 MCG tablet Take 125 mcg by mouth daily.      Marland Kitchen  metoprolol succinate (TOPROL-XL) 100 MG 24 hr tablet Take 100 mg by mouth 2 (two) times daily. Take with or immediately following a meal.      . Multiple Vitamins-Minerals (PRESERVISION/LUTEIN) CAPS Take by mouth 2 (two) times daily.        . Probiotic Product (ALIGN) 4 MG CAPS Take 4 mg by mouth daily.       . vitamin B-12 (CYANOCOBALAMIN) 1000 MCG tablet Take 1,000 mcg by mouth daily.      Marland Kitchen warfarin (COUMADIN) 1 MG tablet Take by mouth as directed.       No current facility-administered medications for this visit.    Social History History   Social History  . Marital Status: Widowed    Spouse Name: N/A    Number of Children: 2  . Years of Education: N/A   Occupational History  . Retired [employer] operational officer    Social History Main Topics  . Smoking status:  Never Smoker   . Smokeless tobacco: Never Used  . Alcohol Use: No     Comment: Occasional wine.  . Drug Use: No  . Sexual Activity: No   Other Topics Concern  . Not on file   Social History Narrative   Widowed. Lives at [facility], Health Care Unit, skilled nursing care.  Minimal ambulation with walker.  Mostly wheelchair bound.       Review of Systems General: No chills, fever, night sweats or weight changes Cardiovascular: No chest pain, dyspnea on exertion, edema, orthopnea, palpitations, paroxysmal nocturnal dyspnea Dermatological: No rash, lesions or masses Respiratory: No cough, dyspnea Urologic: No hematuria, dysuria Abdominal: No nausea, vomiting, diarrhea, bright red blood per rectum, melena, or hematemesis Neurologic: No visual changes, weakness, changes in mental status All other systems reviewed and are otherwise negative except as noted above.  Physical Exam Vitals: Blood pressure 100/64, pulse 63, height 5\' 5"  (1.651 m), weight 117 lb (53.071 kg), SpO2 96.00%.  General: Well developed, well appearing, elderly 79 y.o. female in no acute distress. HEENT: Normocephalic, atraumatic. EOMs intact. Sclera nonicteric. Oropharynx clear.  Neck: Supple. No JVD. Lungs: Respirations regular and unlabored, CTA bilaterally. No wheezes, rales or rhonchi. Heart: RRR. S1, S2 present. No murmurs, rub, S3 or S4. Abdomen: Soft, non-distended. Extremities: No clubbing, cyanosis or edema. PT/Radials 2+ and equal bilaterally. Psych: Normal affect. Neuro: Alert and oriented X 3. Moves all extremities spontaneously.   Diagnostics Recent Labs (copy sent with patient from [facility])  Digoxin level 11/15/2012 - 1.6 Most recent BMET 02/06/2012 - Sodium 138, potassium 4.4, BUN 19, Cr 0.98  Device interrogation today - normal VVI PPM function with good battery status and stable lead measurements; V paced 93 % of time; no episodes; no programming changes made; see  PaceArt report  Assessment and Plan 1. Bradycardia s/p PPM  - normal device function - no episodes - no programming changes made - continue remote device checks every 3 months - follow-up with me or Dr. Graciela Husbands in one year unless needed sooner  2. Atrial fibrillation  - stable; asymptomatic and rate controlled - discontinue digoxin - continue rate control with metoprolol and warfarin for embolic prophylaxis 3. Fatigue - histograms are appropriate - probably multifactorial but aggravated by hypotension - will decrease enalapril dose to 10 mg once daily  - PCP to follow BP at [facility]  Signed, Rick Duff, PA-C 11/20/2012, 2:25 PM

## 2012-11-20 NOTE — Patient Instructions (Addendum)
Your physician has recommended you make the following change in your medication:   STOP YOUR DIGOXIN DECREASE YOUR ENALAPRIL 10 MG ONCE A DAY  Your physician recommends that you schedule a follow-up appointment in: 3 MONTHS REMOTE PACER CHECK  Your physician recommends that you return for lab work in:  CHECK DIGOXIN LEVEL IN 6 WEEKS

## 2012-12-25 ENCOUNTER — Non-Acute Institutional Stay (SKILLED_NURSING_FACILITY): Payer: Medicare Other | Admitting: Nurse Practitioner

## 2012-12-25 ENCOUNTER — Encounter: Payer: Self-pay | Admitting: Nurse Practitioner

## 2012-12-25 DIAGNOSIS — F329 Major depressive disorder, single episode, unspecified: Secondary | ICD-10-CM

## 2012-12-25 DIAGNOSIS — D62 Acute posthemorrhagic anemia: Secondary | ICD-10-CM

## 2012-12-25 DIAGNOSIS — I509 Heart failure, unspecified: Secondary | ICD-10-CM

## 2012-12-25 DIAGNOSIS — R609 Edema, unspecified: Secondary | ICD-10-CM

## 2012-12-25 DIAGNOSIS — I10 Essential (primary) hypertension: Secondary | ICD-10-CM

## 2012-12-25 DIAGNOSIS — G3184 Mild cognitive impairment, so stated: Secondary | ICD-10-CM

## 2012-12-25 DIAGNOSIS — I4891 Unspecified atrial fibrillation: Secondary | ICD-10-CM

## 2012-12-25 DIAGNOSIS — R634 Abnormal weight loss: Secondary | ICD-10-CM | POA: Insufficient documentation

## 2012-12-25 DIAGNOSIS — E039 Hypothyroidism, unspecified: Secondary | ICD-10-CM

## 2012-12-25 NOTE — Assessment & Plan Note (Signed)
Sad facial looks, takes Lexapro 5mg                        

## 2012-12-25 NOTE — Assessment & Plan Note (Signed)
Rate controlled, off Dig, takes Coumadin for risk reduction.    

## 2012-12-25 NOTE — Assessment & Plan Note (Signed)
Elevated mildly, takes Clonidine 0.1mg(was reduced to 0.2mg 06/29/12), Enalapril 20mg bid, Metoprolol 100mg bid.                

## 2012-12-25 NOTE — Assessment & Plan Note (Signed)
Update CBC pending.    

## 2012-12-25 NOTE — Assessment & Plan Note (Signed)
Gradual weigh loss #113 Nov-#118 Oct # 118 Set # 114 Aug--continue to monitor weight and BLE edema. Prn Furosemide is adequate.

## 2012-12-25 NOTE — Assessment & Plan Note (Addendum)
Levothyroxine daily M-F, Sat and Sun--f/u TSH 2.680 12/24/12

## 2012-12-25 NOTE — Progress Notes (Signed)
Patient ID: [name], female   DOB: [birth year], 79 y.o.   MRN: [id number] Code Status: DNR  Allergies  Allergen Reactions  . Asacol [Mesalamine]   . Aspirin Other (See Comments)    Per mar  . Butalbital-Asa-Caff-Codeine   . Codeine Other (See Comments)    Per mar    Chief Complaint  Patient presents with  . Medical Managment of Chronic Issues  . Dementia    HPI: Patient is a 79 y.o. female seen in the SNF at [facility] today for evaluation of her thyroid, manage anticoagulation, blood pressure, edema and chronic medical conditions.  Problem List Items Addressed This Visit   Acute blood loss anemia     Update CBC pending.     Atrial fibrillation     Rate controlled, off Dig, takes Coumadin for risk reduction.                       CHF (congestive heart failure)     Compensated clinically, only trace edema in ankles, no weight gain, bilateral mid/lower lung dry rales as prior.     Depression     Sad facial looks, takes Lexapro 5mg                    Edema (Chronic)     Gradual weigh loss #113 Nov-#118 Oct # 118 Set # 114 Aug--continue to monitor weight and BLE edema. Prn Furosemide is adequate.       HYPERTENSION, BENIGN     Elevated mildly, takes Clonidine 0.1mg (was reduced to 0.2mg  06/29/12), Enalapril 20mg  bid, Metoprolol 100mg  bid.           Hypothyroidism - Primary     Levothyroxine daily M-F, Sat and Sun--f/u TSH 2.680 12/24/12                    Loss of weight     Multiple factorials-CHF, dementia, depression, HTN. Dietary supplement for now. May consider Mirtazapine is POA desires.     Mild cognitive impairment     Resides at SNF, takes Aricept, progressed rapidly in the past year. Staff reported some increased confusion presented in the past a few days-UA to r/o UTI.                  Review of Systems:  Review of Systems  Constitutional: Negative for fever,  chills, weight loss, malaise/fatigue and diaphoresis.  HENT: Positive for hearing loss. Negative for congestion, ear discharge and sore throat.   Eyes: Negative for pain, discharge and redness.  Respiratory: Negative for cough, sputum production and wheezing.   Cardiovascular: Positive for PND. Negative for chest pain, palpitations, orthopnea and claudication. Leg swelling: ankle/feet edeam, L>R.  Gastrointestinal: Negative for heartburn, nausea, vomiting, abdominal pain, diarrhea, constipation and blood in stool.       Pill dysphagia  Genitourinary: Positive for frequency (incontinent of bladder). Negative for dysuria, urgency, hematuria and flank pain.  Musculoskeletal: Positive for joint pain. Negative for back pain, falls, myalgias and neck pain.  Skin: Negative for itching and rash.       Chronic BLE venous insufficiency. Purplish BLE when in dependent position with several keratotic plaques anterior ly.     Neurological: Negative for dizziness, tingling, tremors, sensory change, speech change, focal weakness, seizures, loss of consciousness and weakness.  Endo/Heme/Allergies: Negative for environmental allergies and polydipsia. Bruises/bleeds easily.  Psychiatric/Behavioral: Positive for depression and memory loss. Negative  for suicidal ideas, hallucinations and substance abuse. The patient is not nervous/anxious and does not have insomnia.      Past Medical History  Diagnosis Date  . Hypertension   . Hypothyroidism   . Osteoarthritis   . Osteoporosis   . Diverticulosis   . Chronic diarrhea   . Macular degeneration   . Kyphosis     of thoracic spine  . History of GI diverticular bleed Sept 3, 2003  . Toxic goiter     hx w subsequent ablation and hypothyroid state  . Mild cognitive disorder   . Bronchiectasis     left lower lobe noted on x-ray 2/05  . Hiatal hernia     hx w repair  . Urinary incontinence   . Urinary tract bacterial infections   . Atrial fibrillation Sept  12, 2005    new onset  . Right renal atrophy 2005  . Pacemaker   . H/O Clostridium difficile infection   . Macular degeneration   . Diverticulosis    Past Surgical History  Procedure Laterality Date  . Repair of hiatal hernia and tube, gastrostomy.  08/06/1999  . Total knee arthroplasty  10/13/2003    Right  . Laparoscopic repair of ventral incisional hernia with  04/21/2000  . Colonoscopy  02/09/2012    Procedure: COLONOSCOPY;  Surgeon: Rachael Fee, MD;  Location: [facility];  Service: Endoscopy;  Laterality: N/A;   Social History:   reports that she has never smoked. She has never used smokeless tobacco. She reports that she does not drink alcohol or use illicit drugs.  Family History  Problem Relation Age of Onset  . Aneurysm Father   . Cancer Daughter     Breast and lung cancer  . Hypertension Son   . Hyperlipidemia Son     Medications: Reviewed at [facility]   Physical Exam: Physical Exam  Constitutional: She is oriented to person, place, and time. She appears well-developed and well-nourished. No distress.  HENT:  Head: Normocephalic and atraumatic.  Right Ear: Tympanic membrane normal. No tenderness. No mastoid tenderness. Decreased hearing is noted.  Left Ear: Tympanic membrane normal. No tenderness. No mastoid tenderness. Decreased hearing is noted.  Eyes: Conjunctivae and EOM are normal. Pupils are equal, round, and reactive to light.  Neck: Normal range of motion. Neck supple. No JVD present. No thyromegaly present.  Cardiovascular: Normal rate.  An irregular rhythm present.  Murmur heard.  Systolic murmur is present with a grade of 2/6  Left upper chest pacemaker.   Pulmonary/Chest: Effort normal. She has no wheezes. She has rales (bibasilar dry rales). She exhibits no tenderness.  Abdominal: Soft. Bowel sounds are normal. There is no tenderness.  Musculoskeletal: Normal range of motion. She exhibits edema (BLE. L>R left ankle. ).  Lymphadenopathy:    She has no  cervical adenopathy.  Neurological: She is alert and oriented to person, place, and time. She has normal reflexes. No cranial nerve deficit. She exhibits normal muscle tone. Coordination normal.  Skin: Skin is warm and dry. No rash noted. She is not diaphoretic. Erythema: bilateral lower leg pigmentation.  Chronic BLE venous insufficiency. Purplish BLE when in dependent position with several keratotic plaques anterior ly.      Psychiatric: Her speech is normal. Her affect is not angry, not blunt and not inappropriate. She is not aggressive. Thought content is not paranoid and not delusional. Cognition and memory are impaired. She expresses impulsivity. She does not express inappropriate judgment. She exhibits a depressed  mood. She exhibits abnormal recent memory.    Filed Vitals:   12/25/12 1052  BP: 168/71  Pulse: 63  Temp: 98.7 F (37.1 C)  TempSrc: Tympanic  Resp: 18  SpO2: 97%      Labs reviewed: Basic Metabolic Panel:  Recent Labs  40/98/11 0457 02/08/12 0610 02/09/12 0500  06/26/12  08/27/12 09/03/12 10/04/12 10/29/12 12/24/12  NA 140 142 144  < > 138  --  140  --  142  --   --   K 3.9 3.5 3.8  < > 4.0  --  4.8  --  4.5  --   --   CL 109 109 111  --   --   --   --   --   --   --   --   CO2 24 24 24   --   --   --   --   --   --   --   --   GLUCOSE 86 97 90  --   --   --   --   --   --   --   --   BUN 21 15 16   < > 10  --  42*  --  20  --   --   CREATININE 0.90 0.90 0.92  < > 0.8  --  1.4*  --  1.0  --   --   CALCIUM 8.2* 8.9 8.8  --   --   --   --   --   --   --   --   TSH  --   --   --   --   --   < >  --  0.26*  --  5.30 2.68  < > = values in this interval not displayed. Liver Function Tests:  Recent Labs  02/07/12 0457  AST 11  ALT 5  ALKPHOS 65  BILITOT 0.4  PROT 5.1*  ALBUMIN 2.3*    CBC:  Recent Labs  02/06/12 0815  02/08/12 0610 02/08/12 1942 02/09/12 0540  06/26/12 07/26/12 09/03/12  WBC 5.7  < > 5.8 7.1 6.4  < > 7.7 6.1 5.0  NEUTROABS 3.5  --    --   --   --   --   --   --   --   HGB 11.5*  < > 9.9* 10.0* 9.3*  < > 13.4 11.9* 11.8*  HCT 35.7*  < > 31.5* 31.6* 29.8*  < > 41 36 35*  MCV 91.5  < > 92.9 92.7 94.3  --   --   --   --   PLT 196  < > 197 185 172  < > 227 147* 140*  < > = values in this interval not displayed.      Assessment/Plan Hypothyroidism Levothyroxine daily M-F, Sat and Sun--f/u TSH 2.680 12/24/12                  Mild cognitive impairment Resides at SNF, takes Aricept, progressed rapidly in the past year. Staff reported some increased confusion presented in the past a few days-UA to r/o UTI.             HYPERTENSION, BENIGN Elevated mildly, takes Clonidine 0.1mg (was reduced to 0.2mg  06/29/12), Enalapril 20mg  bid, Metoprolol 100mg  bid.         Edema Gradual weigh loss #113 Nov-#118 Oct # 118 Set # 114 Aug--continue to monitor weight and BLE edema. Prn Furosemide  is adequate.     Depression Sad facial looks, takes Lexapro 5mg                  CHF (congestive heart failure) Compensated clinically, only trace edema in ankles, no weight gain, bilateral mid/lower lung dry rales as prior.   Atrial fibrillation Rate controlled, off Dig, takes Coumadin for risk reduction.                     Acute blood loss anemia Update CBC pending.   Loss of weight Multiple factorials-CHF, dementia, depression, HTN. Dietary supplement for now. May consider Mirtazapine is POA desires.     Family/ Staff Communication: observe the patient and monitor her weight/edema.   Goals of Care: SNF  Labs/tests ordered: CBC and UA pending.

## 2012-12-25 NOTE — Assessment & Plan Note (Signed)
Multiple factorials-CHF, dementia, depression, HTN. Dietary supplement for now. May consider Mirtazapine is POA desires.

## 2012-12-25 NOTE — Assessment & Plan Note (Signed)
Compensated clinically, only trace edema in ankles, no weight gain, bilateral mid/lower lung dry rales as prior.        

## 2012-12-25 NOTE — Assessment & Plan Note (Addendum)
Resides at SNF, takes Aricept, progressed rapidly in the past year. Staff reported some increased confusion presented in the past a few days-UA to r/o UTI.

## 2012-12-26 ENCOUNTER — Encounter: Payer: Self-pay | Admitting: Internal Medicine

## 2013-01-10 ENCOUNTER — Non-Acute Institutional Stay (SKILLED_NURSING_FACILITY): Payer: Medicare Other | Admitting: Nurse Practitioner

## 2013-01-10 DIAGNOSIS — R609 Edema, unspecified: Secondary | ICD-10-CM

## 2013-01-10 DIAGNOSIS — I509 Heart failure, unspecified: Secondary | ICD-10-CM

## 2013-01-10 DIAGNOSIS — D62 Acute posthemorrhagic anemia: Secondary | ICD-10-CM

## 2013-01-10 DIAGNOSIS — I10 Essential (primary) hypertension: Secondary | ICD-10-CM

## 2013-01-10 DIAGNOSIS — E039 Hypothyroidism, unspecified: Secondary | ICD-10-CM

## 2013-01-10 DIAGNOSIS — J209 Acute bronchitis, unspecified: Secondary | ICD-10-CM

## 2013-01-10 DIAGNOSIS — F329 Major depressive disorder, single episode, unspecified: Secondary | ICD-10-CM

## 2013-01-10 DIAGNOSIS — I4891 Unspecified atrial fibrillation: Secondary | ICD-10-CM

## 2013-01-11 ENCOUNTER — Encounter: Payer: Self-pay | Admitting: Nurse Practitioner

## 2013-01-11 NOTE — Assessment & Plan Note (Signed)
Sad facial looks, takes Lexapro 5mg                        

## 2013-01-11 NOTE — Assessment & Plan Note (Signed)
Elevated mildly, takes Clonidine 0.1mg(was reduced to 0.2mg 06/29/12), Enalapril 20mg bid, Metoprolol 100mg bid.                

## 2013-01-11 NOTE — Assessment & Plan Note (Signed)
Levothyroxine 100mcg M-F, 125mcg Sat + Sun-F/u TSH in 8 weeks. Was on 100mcg daily and TSH 5.304 10/29/12                 

## 2013-01-11 NOTE — Assessment & Plan Note (Signed)
Update CBC pending.

## 2013-01-11 NOTE — Assessment & Plan Note (Signed)
Compensated clinically, only trace edema in ankles, no weight gain, bilateral mid/lower lung dry rales as prior.        

## 2013-01-11 NOTE — Assessment & Plan Note (Signed)
Rate controlled, off Dig, takes Coumadin for risk reduction.    

## 2013-01-11 NOTE — Assessment & Plan Note (Signed)
Congestive cough, CXR12/10/14 showed no inflammatory consolidate or suspicious nodule. Augmentin 875mg  bid for total 10 days along with FloraStor bid. Observe the patient.

## 2013-01-11 NOTE — Progress Notes (Signed)
Patient ID: [name], female   DOB: Jan 21, 1918, 79 y.o.   MRN: [id number]   Code Status: DNR  Allergies  Allergen Reactions  . Asacol [Mesalamine]   . Aspirin Other (See Comments)    Per mar  . Butalbital-Asa-Caff-Codeine   . Codeine Other (See Comments)    Per mar    Chief Complaint  Patient presents with  . Medical Managment of Chronic Issues    congestive cough  . Acute Visit    HPI: Patient is a 79 y.o. female seen in the SNF at [facility] today for evaluation of congestive cough and other chronic medical conditions.  Problem List Items Addressed This Visit   Edema - Primary (Chronic)     continue to monitor weight and BLE edema. Prn Furosemide is adequate.         HYPERTENSION, BENIGN     Elevated mildly, takes Clonidine 0.1mg (was reduced to 0.2mg  06/29/12), Enalapril 20mg  bid, Metoprolol 100mg  bid.             Relevant Medications      enalapril (VASOTEC) 10 MG tablet   Atrial fibrillation     Rate controlled, off Dig, takes Coumadin for risk reduction.                         Acute blood loss anemia     Update CBC pending.       Hypothyroidism     Levothyroxine M-F, Sat + Sun-F/u TSH in 8 weeks. Was on daily and TSH 5.304 10/29/12                      Depression     Sad facial looks, takes Lexapro 5mg                      Relevant Medications      escitalopram (LEXAPRO) 5 MG tablet   CHF (congestive heart failure)     Compensated clinically, only trace edema in ankles, no weight gain, bilateral mid/lower lung dry rales as prior.       Acute bronchitis     Congestive cough, CXR12/10/14 showed no inflammatory consolidate or suspicious nodule. Augmentin 875mg  bid for total 10 days along with FloraStor bid. Observe the patient.        Review of Systems:  Review of Systems  Constitutional: Negative for fever, chills, weight loss, malaise/fatigue and  diaphoresis.  HENT: Positive for congestion and hearing loss. Negative for ear discharge and sore throat.   Eyes: Negative for pain, discharge and redness.  Respiratory: Positive for cough. Negative for sputum production and wheezing.   Cardiovascular: Positive for PND. Negative for chest pain, palpitations, orthopnea and claudication. Leg swelling: ankle/feet edeam, L>R.  Gastrointestinal: Negative for heartburn, nausea, vomiting, abdominal pain, diarrhea, constipation and blood in stool.       Pill dysphagia  Genitourinary: Positive for frequency (incontinent of bladder). Negative for dysuria, urgency, hematuria and flank pain.  Musculoskeletal: Positive for joint pain. Negative for back pain, falls, myalgias and neck pain.  Skin: Negative for itching and rash.       Chronic BLE venous insufficiency. Purplish BLE when in dependent position with several keratotic plaques anterior ly.     Neurological: Negative for dizziness, tingling, tremors, sensory change, speech change, focal weakness, seizures, loss of consciousness and weakness.  Endo/Heme/Allergies: Negative for environmental allergies and polydipsia. Bruises/bleeds easily.  Psychiatric/Behavioral: Positive for  depression and memory loss. Negative for suicidal ideas, hallucinations and substance abuse. The patient is not nervous/anxious and does not have insomnia.      Past Medical History  Diagnosis Date  . Hypertension   . Hypothyroidism   . Osteoarthritis   . Osteoporosis   . Diverticulosis   . Chronic diarrhea   . Macular degeneration   . Kyphosis     of thoracic spine  . History of GI diverticular bleed Sept 3, 2003  . Toxic goiter     hx w subsequent ablation and hypothyroid state  . Mild cognitive disorder   . Bronchiectasis     left lower lobe noted on x-ray 2/05  . Hiatal hernia     hx w repair  . Urinary incontinence   . Urinary tract bacterial infections   . Atrial fibrillation Sept 12, 2005    new onset  .  Right renal atrophy 2005  . Pacemaker   . H/O Clostridium difficile infection   . Macular degeneration   . Diverticulosis    Past Surgical History  Procedure Laterality Date  . Repair of hiatal hernia and tube, gastrostomy.  08/06/1999  . Total knee arthroplasty  10/13/2003    Right  . Laparoscopic repair of ventral incisional hernia with  04/21/2000  . Colonoscopy  02/09/2012    Procedure: COLONOSCOPY;  Surgeon: Rachael Fee, MD;  Location: [facility];  Service: Endoscopy;  Laterality: N/A;   Social History:   reports that she has never smoked. She has never used smokeless tobacco. She reports that she does not drink alcohol or use illicit drugs.  Family History  Problem Relation Age of Onset  . Aneurysm Father   . Cancer Daughter     Breast and lung cancer  . Hypertension Son   . Hyperlipidemia Son     Medications: Patient's Medications  New Prescriptions   No medications on file  Previous Medications   ACETAMINOPHEN (TYLENOL) 500 MG TABLET    Take 500 mg by mouth every 4 (four) hours as needed.    CLONIDINE (CATAPRES) 0.2 MG TABLET    Take 0.1 mg by mouth daily.    DONEPEZIL (ARICEPT) 10 MG TABLET    Take 10 mg by mouth at bedtime.    ESCITALOPRAM (LEXAPRO) 5 MG TABLET    Take 5 mg by mouth daily.   FOLIC ACID (FOLVITE) 1 MG TABLET    Take 1 mg by mouth daily.   FUROSEMIDE (LASIX) 20 MG TABLET    Take 20 mg by mouth. One a week for weigh gain 3-5Ibs as needed.   LEVOTHYROXINE (SYNTHROID, LEVOTHROID) 125 MCG TABLET    Take 100 mcg by mouth daily. Except Sat and Sun   METOPROLOL SUCCINATE (TOPROL-XL) 100 MG 24 HR TABLET    Take 100 mg by mouth 2 (two) times daily. Take with or immediately following a meal.   MULTIPLE VITAMINS-MINERALS (PRESERVISION/LUTEIN) CAPS    Take by mouth 2 (two) times daily.     PROBIOTIC PRODUCT (ALIGN) 4 MG CAPS    Take 4 mg by mouth daily.    VITAMIN B-12 (CYANOCOBALAMIN) 1000 MCG TABLET    Take 1,000 mcg by mouth daily.   WARFARIN  (COUMADIN) 1 MG TABLET    Take by mouth as directed.  Modified Medications   Modified Medication Previous Medication   ENALAPRIL (VASOTEC) 10 MG TABLET enalapril (VASOTEC) 10 MG tablet      Take 20 mg by mouth 2 (two) times  daily.    Take 1 tablet (10 mg total) by mouth daily.  Discontinued Medications   DIGOXIN (LANOXIN) 0.125 MG TABLET    Take 125 mcg by mouth daily.       Physical Exam: Physical Exam  Constitutional: She is oriented to person, place, and time. She appears well-developed and well-nourished. No distress.  HENT:  Head: Normocephalic and atraumatic.  Right Ear: Tympanic membrane normal. No tenderness. No mastoid tenderness. Decreased hearing is noted.  Left Ear: Tympanic membrane normal. No tenderness. No mastoid tenderness. Decreased hearing is noted.  Eyes: Conjunctivae and EOM are normal. Pupils are equal, round, and reactive to light.  Neck: Normal range of motion. Neck supple. No JVD present. No thyromegaly present.  Cardiovascular: Normal rate.  An irregular rhythm present.  Murmur heard.  Systolic murmur is present with a grade of 2/6  Left upper chest pacemaker.   Pulmonary/Chest: Effort normal. She has no wheezes. She has rales (bibasilar dry rales). She exhibits no tenderness.  Moist rales bilateral lower lungs.  Abdominal: Soft. Bowel sounds are normal. There is no tenderness.  Musculoskeletal: Normal range of motion. She exhibits edema (BLE. L>R left ankle. ).  Lymphadenopathy:    She has no cervical adenopathy.  Neurological: She is alert and oriented to person, place, and time. She has normal reflexes. No cranial nerve deficit. She exhibits normal muscle tone. Coordination normal.  Skin: Skin is warm and dry. No rash noted. She is not diaphoretic. Erythema: bilateral lower leg pigmentation.  Chronic BLE venous insufficiency. Purplish BLE when in dependent position with several keratotic plaques anterior ly.      Psychiatric: Her speech is normal. Her  affect is not angry, not blunt and not inappropriate. She is not aggressive. Thought content is not paranoid and not delusional. Cognition and memory are impaired. She expresses impulsivity. She does not express inappropriate judgment. She exhibits a depressed mood. She exhibits abnormal recent memory.    Filed Vitals:   01/10/13 1132  BP: 130/72  Pulse: 82  Temp: 98.4 F (36.9 C)  TempSrc: Tympanic  Resp: 24      Labs reviewed: Basic Metabolic Panel:  Recent Labs  16/10/96 0457 02/08/12 0610 02/09/12 0500  06/26/12  08/27/12 09/03/12 10/04/12 10/29/12 12/24/12  NA 140 142 144  < > 138  --  140  --  142  --   --   K 3.9 3.5 3.8  < > 4.0  --  4.8  --  4.5  --   --   CL 109 109 111  --   --   --   --   --   --   --   --   CO2 24 24 24   --   --   --   --   --   --   --   --   GLUCOSE 86 97 90  --   --   --   --   --   --   --   --   BUN 21 15 16   < > 10  --  42*  --  20  --   --   CREATININE 0.90 0.90 0.92  < > 0.8  --  1.4*  --  1.0  --   --   CALCIUM 8.2* 8.9 8.8  --   --   --   --   --   --   --   --   TSH  --   --   --   --   --   < >  --  0.26*  --  5.30 2.68  < > = values in this interval not displayed. Liver Function Tests:  Recent Labs  02/07/12 0457  AST 11  ALT 5  ALKPHOS 65  BILITOT 0.4  PROT 5.1*  ALBUMIN 2.3*   CBC:  Recent Labs  02/06/12 0815  02/08/12 0610 02/08/12 1942 02/09/12 0540  06/26/12 07/26/12 09/03/12  WBC 5.7  < > 5.8 7.1 6.4  < > 7.7 6.1 5.0  NEUTROABS 3.5  --   --   --   --   --   --   --   --   HGB 11.5*  < > 9.9* 10.0* 9.3*  < > 13.4 11.9* 11.8*  HCT 35.7*  < > 31.5* 31.6* 29.8*  < > 41 36 35*  MCV 91.5  < > 92.9 92.7 94.3  --   --   --   --   PLT 196  < > 197 185 172  < > 227 147* 140*  < > = values in this interval not displayed. Past Procedures:  01/09/13 CXR borderline cardiomegaly unchanged without pulmonary vascular congestion or pleural effusion, old granulomatous disease unchanged, mild interstitial findings area seen at  both lower lungs unchanged and likely chronic, no inflammatory consolidate or suspicious nodule.     Assessment/Plan Edema continue to monitor weight and BLE edema. Prn Furosemide is adequate.       HYPERTENSION, BENIGN Elevated mildly, takes Clonidine 0.1mg (was reduced to 0.2mg  06/29/12), Enalapril 20mg  bid, Metoprolol 100mg  bid.           Atrial fibrillation Rate controlled, off Dig, takes Coumadin for risk reduction.                       Acute blood loss anemia Update CBC pending.     Hypothyroidism Levothyroxine M-F, Sat + Sun-F/u TSH in 8 weeks. Was on daily and TSH 5.304 10/29/12                    Depression Sad facial looks, takes Lexapro 5mg                    CHF (congestive heart failure) Compensated clinically, only trace edema in ankles, no weight gain, bilateral mid/lower lung dry rales as prior.     Acute bronchitis Congestive cough, CXR12/10/14 showed no inflammatory consolidate or suspicious nodule. Augmentin 875mg  bid for total 10 days along with FloraStor bid. Observe the patient.     Family/ Staff Communication: observe the patient.   Goals of Care: SNF  Labs/tests ordered: CBC and CMP

## 2013-01-11 NOTE — Assessment & Plan Note (Signed)
continue to monitor weight and BLE edema. Prn Furosemide is adequate.          

## 2013-01-14 LAB — BASIC METABOLIC PANEL
BUN: 14 mg/dL (ref 4–21)
Glucose: 91 mg/dL
Potassium: 3.9 mmol/L (ref 3.4–5.3)

## 2013-01-14 LAB — CBC AND DIFFERENTIAL
Hemoglobin: 13.3 g/dL (ref 12.0–16.0)
WBC: 5.7 10^3/mL

## 2013-01-14 LAB — HEPATIC FUNCTION PANEL
AST: 20 U/L (ref 13–35)
Bilirubin, Total: 0.5 mg/dL

## 2013-01-15 ENCOUNTER — Non-Acute Institutional Stay (SKILLED_NURSING_FACILITY): Payer: Medicare Other | Admitting: Nurse Practitioner

## 2013-01-15 ENCOUNTER — Encounter: Payer: Self-pay | Admitting: Nurse Practitioner

## 2013-01-15 DIAGNOSIS — I509 Heart failure, unspecified: Secondary | ICD-10-CM

## 2013-01-15 DIAGNOSIS — I4891 Unspecified atrial fibrillation: Secondary | ICD-10-CM

## 2013-01-15 DIAGNOSIS — R609 Edema, unspecified: Secondary | ICD-10-CM

## 2013-01-15 DIAGNOSIS — F329 Major depressive disorder, single episode, unspecified: Secondary | ICD-10-CM

## 2013-01-15 DIAGNOSIS — G3184 Mild cognitive impairment, so stated: Secondary | ICD-10-CM

## 2013-01-15 DIAGNOSIS — K121 Other forms of stomatitis: Secondary | ICD-10-CM

## 2013-01-15 DIAGNOSIS — I10 Essential (primary) hypertension: Secondary | ICD-10-CM

## 2013-01-15 DIAGNOSIS — E039 Hypothyroidism, unspecified: Secondary | ICD-10-CM

## 2013-01-15 DIAGNOSIS — J209 Acute bronchitis, unspecified: Secondary | ICD-10-CM

## 2013-01-15 NOTE — Assessment & Plan Note (Signed)
Elevated mildly, takes Clonidine 0.1mg(was reduced to 0.2mg 06/29/12), Enalapril 20mg bid, Metoprolol 100mg bid.                

## 2013-01-15 NOTE — Assessment & Plan Note (Signed)
Rate controlled, off Dig, takes Coumadin for risk reduction.    

## 2013-01-15 NOTE — Assessment & Plan Note (Signed)
Resides at SNF, takes Aricept, progressed rapidly in the past year.    

## 2013-01-15 NOTE — Assessment & Plan Note (Signed)
Sad facial looks, takes Lexapro 5mg                        

## 2013-01-15 NOTE — Assessment & Plan Note (Signed)
Compensated clinically, only trace edema in ankles, no weight gain, bilateral mid/lower lung dry rales as prior.        

## 2013-01-15 NOTE — Assessment & Plan Note (Signed)
continue to monitor weight and BLE edema. Prn Furosemide is adequate.          

## 2013-01-15 NOTE — Progress Notes (Signed)
Patient ID: [name], female   DOB: 06/06/1917, 79 y.o.   MRN: [id number]   Code Status: DNR  Allergies  Allergen Reactions  . Asacol [Mesalamine]   . Aspirin Other (See Comments)    Per mar  . Butalbital-Asa-Caff-Codeine   . Codeine Other (See Comments)    Per mar    Chief Complaint  Patient presents with  . Medical Managment of Chronic Issues    sore mouth.   . Acute Visit    HPI: Patient is a 79 y.o. female seen in the SNF at [facility] today for evaluation of sore mouth, acute bronchitis, and other chronic medical conditions.  Problem List Items Addressed This Visit   Edema (Chronic)     continue to monitor weight and BLE edema. Prn Furosemide is adequate.           HYPERTENSION, BENIGN     Elevated mildly, takes Clonidine 0.1mg (was reduced to 0.2mg  06/29/12), Enalapril 20mg  bid, Metoprolol 100mg  bid.               Atrial fibrillation     Rate controlled, off Dig, takes Coumadin for risk reduction.                           Hypothyroidism     Levothyroxine M-F, Sat + Sun-TSH 2.68 12/24/12                        Mild cognitive impairment     Resides at SNF, takes Aricept, progressed rapidly in the past year.                 Depression     Sad facial looks, takes Lexapro 5mg                        CHF (congestive heart failure)     Compensated clinically, only trace edema in ankles, no weight gain, bilateral mid/lower lung dry rales as prior.         Acute bronchitis     Healing nicely--Congestive cough, CXR12/10/14 showed no inflammatory consolidate or suspicious nodule. Augmentin 875mg  bid for total 10 days along with FloraStor bid since 12/11/4      Stomatitis - Primary     Inflamed oral cavity linings--Magic mouth wash 5ml swish and swallow ac and hs x4 weeks.        Review of Systems:  Review of Systems  Constitutional: Negative for  fever, chills, weight loss, malaise/fatigue and diaphoresis.  HENT: Positive for congestion and hearing loss. Negative for ear discharge and sore throat.   Eyes: Negative for pain, discharge and redness.  Respiratory: Positive for cough. Negative for sputum production and wheezing.        Improved  Cardiovascular: Positive for PND. Negative for chest pain, palpitations, orthopnea and claudication. Leg swelling: ankle/feet edeam, L>R.  Gastrointestinal: Negative for heartburn, nausea, vomiting, abdominal pain, diarrhea, constipation and blood in stool.       Pill dysphagia  Genitourinary: Positive for frequency (incontinent of bladder). Negative for dysuria, urgency, hematuria and flank pain.  Musculoskeletal: Positive for joint pain. Negative for back pain, falls, myalgias and neck pain.  Skin: Negative for itching and rash.       Chronic BLE venous insufficiency. Purplish BLE when in dependent position with several keratotic plaques anterior ly.   Sore mouth  Neurological: Negative for dizziness, tingling,  tremors, sensory change, speech change, focal weakness, seizures, loss of consciousness and weakness.  Endo/Heme/Allergies: Negative for environmental allergies and polydipsia. Bruises/bleeds easily.  Psychiatric/Behavioral: Positive for depression and memory loss. Negative for suicidal ideas, hallucinations and substance abuse. The patient is not nervous/anxious and does not have insomnia.      Past Medical History  Diagnosis Date  . Hypertension   . Hypothyroidism   . Osteoarthritis   . Osteoporosis   . Diverticulosis   . Chronic diarrhea   . Macular degeneration   . Kyphosis     of thoracic spine  . History of GI diverticular bleed Sept 3, 2003  . Toxic goiter     hx w subsequent ablation and hypothyroid state  . Mild cognitive disorder   . Bronchiectasis     left lower lobe noted on x-ray 2/05  . Hiatal hernia     hx w repair  . Urinary incontinence   . Urinary tract  bacterial infections   . Atrial fibrillation Sept 12, 2005    new onset  . Right renal atrophy 2005  . Pacemaker   . H/O Clostridium difficile infection   . Macular degeneration   . Diverticulosis    Past Surgical History  Procedure Laterality Date  . Repair of hiatal hernia and tube, gastrostomy.  08/06/1999  . Total knee arthroplasty  10/13/2003    Right  . Laparoscopic repair of ventral incisional hernia with  04/21/2000  . Colonoscopy  02/09/2012    Procedure: COLONOSCOPY;  Surgeon: Rachael Fee, MD;  Location: [facility];  Service: Endoscopy;  Laterality: N/A;   Social History:   reports that she has never smoked. She has never used smokeless tobacco. She reports that she does not drink alcohol or use illicit drugs.  Family History  Problem Relation Age of Onset  . Aneurysm Father   . Cancer Daughter     Breast and lung cancer  . Hypertension Son   . Hyperlipidemia Son     Medications: Patient's Medications  New Prescriptions   No medications on file  Previous Medications   ACETAMINOPHEN (TYLENOL) 500 MG TABLET    Take 500 mg by mouth every 4 (four) hours as needed.    CLONIDINE (CATAPRES) 0.2 MG TABLET    Take 0.1 mg by mouth daily.    DONEPEZIL (ARICEPT) 10 MG TABLET    Take 10 mg by mouth at bedtime.    ENALAPRIL (VASOTEC) 10 MG TABLET    Take 20 mg by mouth 2 (two) times daily.   ESCITALOPRAM (LEXAPRO) 5 MG TABLET    Take 5 mg by mouth daily.   FOLIC ACID (FOLVITE) 1 MG TABLET    Take 1 mg by mouth daily.   FUROSEMIDE (LASIX) 20 MG TABLET    Take 20 mg by mouth. One a week for weigh gain 3-5Ibs as needed.   LEVOTHYROXINE (SYNTHROID, LEVOTHROID) 125 MCG TABLET    Take 100 mcg by mouth daily. Except Sat and Sun   METOPROLOL SUCCINATE (TOPROL-XL) 100 MG 24 HR TABLET    Take 100 mg by mouth 2 (two) times daily. Take with or immediately following a meal.   MULTIPLE VITAMINS-MINERALS (PRESERVISION/LUTEIN) CAPS    Take by mouth 2 (two) times daily.     PROBIOTIC  PRODUCT (ALIGN) 4 MG CAPS    Take 4 mg by mouth daily.    VITAMIN B-12 (CYANOCOBALAMIN) 1000 MCG TABLET    Take 1,000 mcg by mouth daily.   WARFARIN (  COUMADIN) 1 MG TABLET    Take by mouth as directed.  Modified Medications   No medications on file  Discontinued Medications   No medications on file     Physical Exam: Physical Exam  Constitutional: She is oriented to person, place, and time. She appears well-developed and well-nourished. No distress.  HENT:  Head: Normocephalic and atraumatic.  Right Ear: Tympanic membrane normal. No tenderness. No mastoid tenderness. Decreased hearing is noted.  Left Ear: Tympanic membrane normal. No tenderness. No mastoid tenderness. Decreased hearing is noted.  Eyes: Conjunctivae and EOM are normal. Pupils are equal, round, and reactive to light.  Neck: Normal range of motion. Neck supple. No JVD present. No thyromegaly present.  Cardiovascular: Normal rate.  An irregular rhythm present.  Murmur heard.  Systolic murmur is present with a grade of 2/6  Left upper chest pacemaker.   Pulmonary/Chest: Effort normal. She has no wheezes. She has rales (bibasilar dry rales). She exhibits no tenderness.  Dry rales bibasilar.   Abdominal: Soft. Bowel sounds are normal. There is no tenderness.  Musculoskeletal: Normal range of motion. She exhibits edema (BLE. L>R left ankle. ).  Lymphadenopathy:    She has no cervical adenopathy.  Neurological: She is alert and oriented to person, place, and time. She has normal reflexes. No cranial nerve deficit. She exhibits normal muscle tone. Coordination normal.  Skin: Skin is warm and dry. No rash noted. She is not diaphoretic. Erythema: bilateral lower leg pigmentation.  Chronic BLE venous insufficiency. Purplish BLE when in dependent position with several keratotic plaques anterior ly.   Inflamed oral cavity linings.    Psychiatric: Her speech is normal. Her affect is not angry, not blunt and not inappropriate. She is  not aggressive. Thought content is not paranoid and not delusional. Cognition and memory are impaired. She expresses impulsivity. She does not express inappropriate judgment. She exhibits a depressed mood. She exhibits abnormal recent memory.    Filed Vitals:   01/15/13 1256  BP: 150/58  Pulse: 72  Temp: 96.8 F (36 C)  TempSrc: Tympanic  Resp: 20      Labs reviewed: Basic Metabolic Panel:  Recent Labs  62/13/08 0457 02/08/12 0610 02/09/12 0500  08/27/12 09/03/12 10/04/12 10/29/12 12/24/12 01/14/13  NA 140 142 144  < > 140  --  142  --   --  142  K 3.9 3.5 3.8  < > 4.8  --  4.5  --   --  3.9  CL 109 109 111  --   --   --   --   --   --   --   CO2 24 24 24   --   --   --   --   --   --   --   GLUCOSE 86 97 90  --   --   --   --   --   --   --   BUN 21 15 16   < > 42*  --  20  --   --  14  CREATININE 0.90 0.90 0.92  < > 1.4*  --  1.0  --   --  1.1  CALCIUM 8.2* 8.9 8.8  --   --   --   --   --   --   --   TSH  --   --   --   < >  --  0.26*  --  5.30 2.68  --   < > = values  in this interval not displayed. Liver Function Tests:  Recent Labs  02/07/12 0457 01/14/13  AST 11 20  ALT 5 9  ALKPHOS 65 72  BILITOT 0.4  --   PROT 5.1*  --   ALBUMIN 2.3*  --    CBC:  Recent Labs  02/06/12 0815  02/08/12 0610 02/08/12 1942 02/09/12 0540  07/26/12 09/03/12 01/14/13  WBC 5.7  < > 5.8 7.1 6.4  < > 6.1 5.0 5.7  NEUTROABS 3.5  --   --   --   --   --   --   --   --   HGB 11.5*  < > 9.9* 10.0* 9.3*  < > 11.9* 11.8* 13.3  HCT 35.7*  < > 31.5* 31.6* 29.8*  < > 36 35* 40  MCV 91.5  < > 92.9 92.7 94.3  --   --   --   --   PLT 196  < > 197 185 172  < > 147* 140* 158  < > = values in this interval not displayed. Past Procedures:  01/09/13 CXR borderline cardiomegaly unchanged without pulmonary vascular congestion or pleural effusion, old granulomatous disease unchanged, mild interstitial findings area seen at both lower lungs unchanged and likely chronic, no inflammatory consolidate  or suspicious nodule.     Assessment/Plan Stomatitis Inflamed oral cavity linings--Magic mouth wash 5ml swish and swallow ac and hs x4 weeks.   Acute bronchitis Healing nicely--Congestive cough, CXR12/10/14 showed no inflammatory consolidate or suspicious nodule. Augmentin 875mg  bid for total 10 days along with FloraStor bid since 12/11/4    CHF (congestive heart failure) Compensated clinically, only trace edema in ankles, no weight gain, bilateral mid/lower lung dry rales as prior.       Depression Sad facial looks, takes Lexapro 5mg                      Mild cognitive impairment Resides at SNF, takes Aricept, progressed rapidly in the past year.               Hypothyroidism Levothyroxine M-F, Sat + Sun-TSH 2.68 12/24/12                      Atrial fibrillation Rate controlled, off Dig, takes Coumadin for risk reduction.                         HYPERTENSION, BENIGN Elevated mildly, takes Clonidine 0.1mg (was reduced to 0.2mg  06/29/12), Enalapril 20mg  bid, Metoprolol 100mg  bid.             Edema continue to monitor weight and BLE edema. Prn Furosemide is adequate.           Family/ Staff Communication: observe the patient.   Goals of Care: SNF  Labs/tests ordered: none

## 2013-01-15 NOTE — Assessment & Plan Note (Signed)
Healing nicely--Congestive cough, CXR12/10/14 showed no inflammatory consolidate or suspicious nodule. Augmentin 875mg  bid for total 10 days along with FloraStor bid since 12/11/4

## 2013-01-15 NOTE — Assessment & Plan Note (Signed)
Inflamed oral cavity linings--Magic mouth wash 5ml swish and swallow ac and hs x4 weeks.

## 2013-01-15 NOTE — Assessment & Plan Note (Signed)
Levothyroxine 100mcg M-F, 125mcg Sat + Sun-TSH 2.68 12/24/12                         

## 2013-01-25 ENCOUNTER — Encounter: Payer: Self-pay | Admitting: Internal Medicine

## 2013-02-11 LAB — PROTIME-INR: Protime: 27.4 seconds — AB (ref 10.0–13.8)

## 2013-02-11 LAB — POCT INR: INR: 2.6 — AB (ref 0.9–1.1)

## 2013-02-15 ENCOUNTER — Non-Acute Institutional Stay (SKILLED_NURSING_FACILITY): Payer: Medicare Other | Admitting: Nurse Practitioner

## 2013-02-15 ENCOUNTER — Encounter: Payer: Self-pay | Admitting: Nurse Practitioner

## 2013-02-15 DIAGNOSIS — I10 Essential (primary) hypertension: Secondary | ICD-10-CM

## 2013-02-15 DIAGNOSIS — I4891 Unspecified atrial fibrillation: Secondary | ICD-10-CM

## 2013-02-15 DIAGNOSIS — J209 Acute bronchitis, unspecified: Secondary | ICD-10-CM

## 2013-02-15 DIAGNOSIS — D62 Acute posthemorrhagic anemia: Secondary | ICD-10-CM

## 2013-02-15 DIAGNOSIS — G3184 Mild cognitive impairment, so stated: Secondary | ICD-10-CM

## 2013-02-15 DIAGNOSIS — R609 Edema, unspecified: Secondary | ICD-10-CM

## 2013-02-15 DIAGNOSIS — I509 Heart failure, unspecified: Secondary | ICD-10-CM

## 2013-02-15 DIAGNOSIS — F32A Depression, unspecified: Secondary | ICD-10-CM

## 2013-02-15 DIAGNOSIS — E039 Hypothyroidism, unspecified: Secondary | ICD-10-CM

## 2013-02-15 DIAGNOSIS — F3289 Other specified depressive episodes: Secondary | ICD-10-CM

## 2013-02-15 DIAGNOSIS — F329 Major depressive disorder, single episode, unspecified: Secondary | ICD-10-CM

## 2013-02-15 NOTE — Progress Notes (Signed)
Patient ID: [name], female   DOB: [birth year], 80 y.o.   MRN: [id number]   Code Status: DNR  Allergies  Allergen Reactions  . Asacol [Mesalamine]   . Aspirin Other (See Comments)    Per mar  . Butalbital-Asa-Caff-Codeine   . Codeine Other (See Comments)    Per mar    Chief Complaint  Patient presents with  . Medical Managment of Chronic Issues    on and off confusion.   . Acute Visit    HPI: Patient is a 80 y.o. female seen in the SNF at [facility] today for evaluation of on/off confusion and other chronic medical conditions.  Problem List Items Addressed This Visit   Acute blood loss anemia     Hgb 13.3 01/14/13        Acute bronchitis     Resolved Congestive cough, CXR12/10/14 showed no inflammatory consolidate or suspicious nodule. Augmentin 875mg  bid for total 10 days along with FloraStor bid since 12/11/4        Atrial fibrillation     Rate controlled, off Dig, takes Coumadin for risk reduction.                             CHF (congestive heart failure)     Compensated clinically, only trace edema in ankles, no weight gain, bilateral mid/lower lung dry rales as prior.           Depression     Sad facial looks, takes Lexapro 5mg                          Edema (Chronic)     continue to monitor weight and BLE edema. Prn Furosemide is adequate.             HYPERTENSION, BENIGN     Elevated mildly, takes Clonidine 0.1mg (was reduced to 0.2mg  06/29/12), Enalapril 20mg  bid, Metoprolol 100mg  bid.                 Hypothyroidism     Levothyroxine M-F, Sat + Sun-TSH 2.68 12/24/12                          Mild cognitive impairment - Primary     Resides at SNF, takes Aricept, progressed rapidly in the past year. On and off confusion noted. POA recommended to continue to monitor the patient. Hx of UTI with presentation of lethargy and agitation.                       Review of Systems:  Review of Systems  Constitutional: Negative for fever, chills, weight loss, malaise/fatigue and diaphoresis.  HENT: Positive for hearing loss. Negative for congestion, ear discharge and sore throat.   Eyes: Negative for pain, discharge and redness.  Respiratory: Negative for cough, sputum production and wheezing.   Cardiovascular: Positive for PND. Negative for chest pain, palpitations, orthopnea and claudication. Leg swelling: ankle/feet edeam, L>R.  Gastrointestinal: Negative for heartburn, nausea, vomiting, abdominal pain, diarrhea, constipation and blood in stool.       Pill dysphagia  Genitourinary: Positive for frequency (incontinent of bladder). Negative for dysuria, urgency, hematuria and flank pain.  Musculoskeletal: Positive for joint pain. Negative for back pain, falls, myalgias and neck pain.  Skin: Negative for itching and rash.       Chronic BLE venous insufficiency.  Purplish BLE when in dependent position with several keratotic plaques anterior ly.    Neurological: Negative for dizziness, tingling, tremors, sensory change, speech change, focal weakness, seizures, loss of consciousness and weakness.  Endo/Heme/Allergies: Negative for environmental allergies and polydipsia. Bruises/bleeds easily.  Psychiatric/Behavioral: Positive for depression and memory loss. Negative for suicidal ideas, hallucinations and substance abuse. The patient is not nervous/anxious and does not have insomnia.      Past Medical History  Diagnosis Date  . Hypertension   . Hypothyroidism   . Osteoarthritis   . Osteoporosis   . Diverticulosis   . Chronic diarrhea   . Macular degeneration   . Kyphosis     of thoracic spine  . History of GI diverticular bleed Sept 3, 2003  . Toxic goiter     hx w subsequent ablation and hypothyroid state  . Mild cognitive disorder   . Bronchiectasis     left lower lobe noted on x-ray 2/05  . Hiatal  hernia     hx w repair  . Urinary incontinence   . Urinary tract bacterial infections   . Atrial fibrillation Sept 12, 2005    new onset  . Right renal atrophy 2005  . Pacemaker   . H/O Clostridium difficile infection   . Macular degeneration   . Diverticulosis    Past Surgical History  Procedure Laterality Date  . Repair of hiatal hernia and tube, gastrostomy.  08/06/1999  . Total knee arthroplasty  10/13/2003    Right  . Laparoscopic repair of ventral incisional hernia with  04/21/2000  . Colonoscopy  02/09/2012    Procedure: COLONOSCOPY;  Surgeon: Rachael Fee, MD;  Location: [facility];  Service: Endoscopy;  Laterality: N/A;   Social History:   reports that she has never smoked. She has never used smokeless tobacco. She reports that she does not drink alcohol or use illicit drugs.  Family History  Problem Relation Age of Onset  . Aneurysm Father   . Cancer Daughter     Breast and lung cancer  . Hypertension Son   . Hyperlipidemia Son     Medications: Patient's Medications  New Prescriptions   No medications on file  Previous Medications   ACETAMINOPHEN (TYLENOL) 500 MG TABLET    Take 500 mg by mouth every 4 (four) hours as needed.    CLONIDINE (CATAPRES) 0.2 MG TABLET    Take 0.1 mg by mouth daily.    DONEPEZIL (ARICEPT) 10 MG TABLET    Take 10 mg by mouth at bedtime.    ENALAPRIL (VASOTEC) 10 MG TABLET    Take 20 mg by mouth 2 (two) times daily.   ESCITALOPRAM (LEXAPRO) 5 MG TABLET    Take 5 mg by mouth daily.   FOLIC ACID (FOLVITE) 1 MG TABLET    Take 1 mg by mouth daily.   FUROSEMIDE (LASIX) 20 MG TABLET    Take 20 mg by mouth. One a week for weigh gain 3-5Ibs as needed.   LEVOTHYROXINE (SYNTHROID, LEVOTHROID) 125 MCG TABLET    Take 100 mcg by mouth daily. Except Sat and Sun   METOPROLOL SUCCINATE (TOPROL-XL) 100 MG 24 HR TABLET    Take 100 mg by mouth 2 (two) times daily. Take with or immediately following a meal.   MULTIPLE VITAMINS-MINERALS  (PRESERVISION/LUTEIN) CAPS    Take by mouth 2 (two) times daily.     PROBIOTIC PRODUCT (ALIGN) 4 MG CAPS    Take 4 mg by mouth daily.  VITAMIN B-12 (CYANOCOBALAMIN) 1000 MCG TABLET    Take 1,000 mcg by mouth daily.   WARFARIN (COUMADIN) 1 MG TABLET    Take by mouth as directed.  Modified Medications   No medications on file  Discontinued Medications   No medications on file     Physical Exam: Physical Exam  Constitutional: She is oriented to person, place, and time. She appears well-developed and well-nourished. No distress.  HENT:  Head: Normocephalic and atraumatic.  Right Ear: Tympanic membrane normal. No tenderness. No mastoid tenderness. Decreased hearing is noted.  Left Ear: Tympanic membrane normal. No tenderness. No mastoid tenderness. Decreased hearing is noted.  Eyes: Conjunctivae and EOM are normal. Pupils are equal, round, and reactive to light.  Neck: Normal range of motion. Neck supple. No JVD present. No thyromegaly present.  Cardiovascular: Normal rate.  An irregular rhythm present.  Murmur heard.  Systolic murmur is present with a grade of 2/6  Left upper chest pacemaker.   Pulmonary/Chest: Effort normal. She has no wheezes. She has rales (bibasilar dry rales). She exhibits no tenderness.  Dry rales bibasilar.   Abdominal: Soft. Bowel sounds are normal. There is no tenderness.  Musculoskeletal: Normal range of motion. She exhibits edema (BLE. L>R left ankle. ).  Lymphadenopathy:    She has no cervical adenopathy.  Neurological: She is alert and oriented to person, place, and time. She has normal reflexes. No cranial nerve deficit. She exhibits normal muscle tone. Coordination normal.  Skin: Skin is warm and dry. No rash noted. She is not diaphoretic. Erythema: bilateral lower leg pigmentation.  Chronic BLE venous insufficiency. Purplish BLE when in dependent position with several keratotic plaques anterior ly.   Inflamed oral cavity linings.    Psychiatric: Her  speech is normal. Her affect is not angry, not blunt and not inappropriate. She is not aggressive. Thought content is not paranoid and not delusional. Cognition and memory are impaired. She expresses impulsivity. She does not express inappropriate judgment. She exhibits a depressed mood. She exhibits abnormal recent memory.    Filed Vitals:   02/15/13 1718  BP: 138/87  Pulse: 72  Temp: 98 F (36.7 C)  TempSrc: Tympanic  Resp: 16      Labs reviewed: Basic Metabolic Panel:  Recent Labs  16/10/96 09/03/12 10/04/12 10/29/12 12/24/12 01/14/13  NA 140  --  142  --   --  142  K 4.8  --  4.5  --   --  3.9  BUN 42*  --  20  --   --  14  CREATININE 1.4*  --  1.0  --   --  1.1  TSH  --  0.26*  --  5.30 2.68  --    Liver Function Tests:  Recent Labs  01/14/13  AST 20  ALT 9  ALKPHOS 72   CBC:  Recent Labs  07/26/12 09/03/12 01/14/13  WBC 6.1 5.0 5.7  HGB 11.9* 11.8* 13.3  HCT 36 35* 40  PLT 147* 140* 158   Past Procedures:  01/09/13 CXR borderline cardiomegaly unchanged without pulmonary vascular congestion or pleural effusion, old granulomatous disease unchanged, mild interstitial findings area seen at both lower lungs unchanged and likely chronic, no inflammatory consolidate or suspicious nodule.     Assessment/Plan Mild cognitive impairment Resides at SNF, takes Aricept, progressed rapidly in the past year. On and off confusion noted. POA recommended to continue to monitor the patient. Hx of UTI with presentation of lethargy and agitation.  Hypothyroidism Levothyroxine 100mcg M-F, 125mcg Sat + Sun-TSH 2.68 12/24/12                        HYPERTENSION, BENIGN Elevated mildly, takes Clonidine 0.1mg (was reduced to 0.2mg  06/29/12), Enalapril 20mg  bid, Metoprolol 100mg  bid.               Edema continue to monitor weight and BLE edema. Prn Furosemide is adequate.           Depression Sad facial looks,  takes Lexapro 5mg                        CHF (congestive heart failure) Compensated clinically, only trace edema in ankles, no weight gain, bilateral mid/lower lung dry rales as prior.         Atrial fibrillation Rate controlled, off Dig, takes Coumadin for risk reduction.                           Acute blood loss anemia Hgb 13.3 01/14/13      Acute bronchitis Resolved Congestive cough, CXR12/10/14 showed no inflammatory consolidate or suspicious nodule. Augmentin 875mg  bid for total 10 days along with FloraStor bid since 12/11/4        Family/ Staff Communication: observe the patient.   Goals of Care: SNF  Labs/tests ordered: none

## 2013-02-15 NOTE — Assessment & Plan Note (Signed)
continue to monitor weight and BLE edema. Prn Furosemide is adequate.

## 2013-02-15 NOTE — Assessment & Plan Note (Signed)
Rate controlled, off Dig, takes Coumadin for risk reduction.

## 2013-02-15 NOTE — Assessment & Plan Note (Signed)
Levothyroxine 100mcg M-F, 125mcg Sat + Sun-TSH 2.68 12/24/12

## 2013-02-15 NOTE — Assessment & Plan Note (Signed)
Compensated clinically, only trace edema in ankles, no weight gain, bilateral mid/lower lung dry rales as prior.

## 2013-02-15 NOTE — Assessment & Plan Note (Signed)
Resides at SNF, takes Aricept, progressed rapidly in the past year. On and off confusion noted. POA recommended to continue to monitor the patient. Hx of UTI with presentation of lethargy and agitation.

## 2013-02-15 NOTE — Assessment & Plan Note (Signed)
Sad facial looks, takes Lexapro 5mg 

## 2013-02-15 NOTE — Assessment & Plan Note (Signed)
Hgb 13.3 01/14/13

## 2013-02-15 NOTE — Assessment & Plan Note (Signed)
Elevated mildly, takes Clonidine 0.1mg (was reduced to 0.2mg  06/29/12), Enalapril 20mg  bid, Metoprolol 100mg  bid.

## 2013-02-15 NOTE — Assessment & Plan Note (Signed)
Resolved Congestive cough, CXR12/10/14 showed no inflammatory consolidate or suspicious nodule. Augmentin 875mg  bid for total 10 days along with FloraStor bid since 12/11/4

## 2013-02-19 ENCOUNTER — Ambulatory Visit (INDEPENDENT_AMBULATORY_CARE_PROVIDER_SITE_OTHER): Payer: Medicare Other | Admitting: *Deleted

## 2013-02-19 DIAGNOSIS — I4891 Unspecified atrial fibrillation: Secondary | ICD-10-CM

## 2013-02-19 DIAGNOSIS — Z950 Presence of cardiac pacemaker: Secondary | ICD-10-CM

## 2013-02-20 ENCOUNTER — Encounter: Payer: Self-pay | Admitting: Internal Medicine

## 2013-02-20 LAB — MDC_IDC_ENUM_SESS_TYPE_REMOTE
Brady Statistic RV Percent Paced: 87 %
Date Time Interrogation Session: 20150120083438
Implantable Pulse Generator Model: 1210
Implantable Pulse Generator Serial Number: 7124598
Lead Channel Impedance Value: 640 Ohm
Lead Channel Pacing Threshold Amplitude: 1.25 V
Lead Channel Pacing Threshold Pulse Width: 0.4 ms
Lead Channel Sensing Intrinsic Amplitude: 10.2 mV
Lead Channel Setting Pacing Amplitude: 1.5 V
MDC IDC MSMT BATTERY REMAINING LONGEVITY: 110 mo
MDC IDC MSMT BATTERY VOLTAGE: 2.96 V
MDC IDC SET LEADCHNL RV PACING PULSEWIDTH: 0.4 ms
MDC IDC SET LEADCHNL RV SENSING SENSITIVITY: 2 mV

## 2013-02-25 LAB — POCT INR: INR: 1.5 — AB (ref 0.9–1.1)

## 2013-02-25 LAB — PROTIME-INR: Protime: 18 seconds — AB (ref 10.0–13.8)

## 2013-02-26 ENCOUNTER — Encounter: Payer: Self-pay | Admitting: Nurse Practitioner

## 2013-02-26 ENCOUNTER — Non-Acute Institutional Stay (SKILLED_NURSING_FACILITY): Payer: Medicare Other | Admitting: Nurse Practitioner

## 2013-02-26 DIAGNOSIS — F3289 Other specified depressive episodes: Secondary | ICD-10-CM

## 2013-02-26 DIAGNOSIS — G3184 Mild cognitive impairment, so stated: Secondary | ICD-10-CM

## 2013-02-26 DIAGNOSIS — H9202 Otalgia, left ear: Secondary | ICD-10-CM | POA: Insufficient documentation

## 2013-02-26 DIAGNOSIS — H9209 Otalgia, unspecified ear: Secondary | ICD-10-CM

## 2013-02-26 DIAGNOSIS — I10 Essential (primary) hypertension: Secondary | ICD-10-CM

## 2013-02-26 DIAGNOSIS — E039 Hypothyroidism, unspecified: Secondary | ICD-10-CM

## 2013-02-26 DIAGNOSIS — D62 Acute posthemorrhagic anemia: Secondary | ICD-10-CM

## 2013-02-26 DIAGNOSIS — F32A Depression, unspecified: Secondary | ICD-10-CM

## 2013-02-26 DIAGNOSIS — Z7901 Long term (current) use of anticoagulants: Secondary | ICD-10-CM

## 2013-02-26 DIAGNOSIS — I4891 Unspecified atrial fibrillation: Secondary | ICD-10-CM

## 2013-02-26 DIAGNOSIS — F329 Major depressive disorder, single episode, unspecified: Secondary | ICD-10-CM

## 2013-02-26 NOTE — Assessment & Plan Note (Signed)
Sad facial looks, takes Lexapro 5mg 

## 2013-02-26 NOTE — Assessment & Plan Note (Signed)
Hgb 13.3 01/14/13, takes B12 and Folic acid.

## 2013-02-26 NOTE — Assessment & Plan Note (Signed)
Levothyroxine 100mcg M-F, 125mcg Sat + Sun-TSH 2.68 12/24/12

## 2013-02-26 NOTE — Assessment & Plan Note (Signed)
Resides at SNF, takes Aricept, progressed rapidly in the past year.

## 2013-02-26 NOTE — Assessment & Plan Note (Signed)
Rate controlled, off Dig, takes Coumadin for risk reduction.

## 2013-02-26 NOTE — Assessment & Plan Note (Signed)
Ear was removed, no external ear canal erythema or open areas, TM wnl except diffused dullness. The patient stated her left ear pain is much better before ear wax was removed and no pain at all after it was removed. Monitor the patient.

## 2013-02-26 NOTE — Assessment & Plan Note (Signed)
subtherapeutic INR 1.52 02/25/13, increased Coumadin and repeat INR 03/04/13

## 2013-02-26 NOTE — Assessment & Plan Note (Signed)
Elevated mildly, takes Clonidine 0.1mg (was reduced to 0.2mg  06/29/12), Enalapril 20mg  bid, Metoprolol 100mg  bid.

## 2013-02-26 NOTE — Progress Notes (Signed)
Patient ID: [name], female   DOB: 12-24-1917, 80 y.o.   MRN: [id number]   Code Status: DNR  Allergies  Allergen Reactions  . Asacol [Mesalamine]   . Aspirin Other (See Comments)    Per mar  . Butalbital-Asa-Caff-Codeine   . Codeine Other (See Comments)    Per mar    Chief Complaint  Patient presents with  . Medical Managment of Chronic Issues    left ear hurting  . Acute Visit    HPI: Patient is a 80 y.o. female seen in the SNF at [facility] today for evaluation of left ear pain and other chronic medical conditions.  Problem List Items Addressed This Visit   Acute blood loss anemia     Hgb 13.3 01/14/13, takes B12 and Folic acid.           Atrial fibrillation     Rate controlled, off Dig, takes Coumadin for risk reduction.                               Chronic anticoagulation     subtherapeutic INR 1.52 02/25/13, increased Coumadin and repeat INR 03/04/13    Depression     Sad facial looks, takes Lexapro 5mg                            HYPERTENSION, BENIGN     Elevated mildly, takes Clonidine 0.1mg (was reduced to 0.2mg  06/29/12), Enalapril 20mg  bid, Metoprolol 100mg  bid.                   Hypothyroidism     Levothyroxine M-F, Sat + Sun-TSH 2.68 12/24/12                            Left ear pain - Primary     Ear was removed, no external ear canal erythema or open areas, TM wnl except diffused dullness. The patient stated her left ear pain is much better before ear wax was removed and no pain at all after it was removed. Monitor the patient.     Mild cognitive impairment     Resides at SNF, takes Aricept, progressed rapidly in the past year.                      Review of Systems:  Review of Systems  Constitutional: Negative for fever, chills, weight loss, malaise/fatigue and diaphoresis.  HENT: Positive for ear pain and hearing loss.  Negative for congestion, ear discharge and sore throat.        Left   Eyes: Negative for pain, discharge and redness.  Respiratory: Negative for cough, sputum production and wheezing.   Cardiovascular: Positive for PND. Negative for chest pain, palpitations, orthopnea and claudication. Leg swelling: ankle/feet edeam, L>R.  Gastrointestinal: Negative for heartburn, nausea, vomiting, abdominal pain, diarrhea, constipation and blood in stool.       Pill dysphagia  Genitourinary: Positive for frequency (incontinent of bladder). Negative for dysuria, urgency, hematuria and flank pain.  Musculoskeletal: Positive for joint pain. Negative for back pain, falls, myalgias and neck pain.  Skin: Negative for itching and rash.       Chronic BLE venous insufficiency. Purplish BLE when in dependent position with several keratotic plaques anterior ly.    Neurological: Negative for dizziness, tingling, tremors, sensory change, speech  change, focal weakness, seizures, loss of consciousness and weakness.  Endo/Heme/Allergies: Negative for environmental allergies and polydipsia. Bruises/bleeds easily.  Psychiatric/Behavioral: Positive for depression and memory loss. Negative for suicidal ideas, hallucinations and substance abuse. The patient is not nervous/anxious and does not have insomnia.      Past Medical History  Diagnosis Date  . Hypertension   . Hypothyroidism   . Osteoarthritis   . Osteoporosis   . Diverticulosis   . Chronic diarrhea   . Macular degeneration   . Kyphosis     of thoracic spine  . History of GI diverticular bleed Sept 3, 2003  . Toxic goiter     hx w subsequent ablation and hypothyroid state  . Mild cognitive disorder   . Bronchiectasis     left lower lobe noted on x-ray 2/05  . Hiatal hernia     hx w repair  . Urinary incontinence   . Urinary tract bacterial infections   . Atrial fibrillation Sept 12, 2005    new onset  . Right renal atrophy 2005  . Pacemaker   . H/O  Clostridium difficile infection   . Macular degeneration   . Diverticulosis    Past Surgical History  Procedure Laterality Date  . Repair of hiatal hernia and tube, gastrostomy.  08/06/1999  . Total knee arthroplasty  10/13/2003    Right  . Laparoscopic repair of ventral incisional hernia with  04/21/2000  . Colonoscopy  02/09/2012    Procedure: COLONOSCOPY;  Surgeon: Rachael Feeaniel P Jacobs, MD;  Location: [facility];  Service: Endoscopy;  Laterality: N/A;   Social History:   reports that she has never smoked. She has never used smokeless tobacco. She reports that she does not drink alcohol or use illicit drugs.  Family History  Problem Relation Age of Onset  . Aneurysm Father   . Cancer Daughter     Breast and lung cancer  . Hypertension Son   . Hyperlipidemia Son     Medications: Patient's Medications  New Prescriptions   No medications on file  Previous Medications   ACETAMINOPHEN (TYLENOL) 500 MG TABLET    Take 500 mg by mouth every 4 (four) hours as needed.    CLONIDINE (CATAPRES) 0.2 MG TABLET    Take 0.1 mg by mouth daily.    DONEPEZIL (ARICEPT) 10 MG TABLET    Take 10 mg by mouth at bedtime.    ENALAPRIL (VASOTEC) 10 MG TABLET    Take 20 mg by mouth 2 (two) times daily.   ESCITALOPRAM (LEXAPRO) 5 MG TABLET    Take 5 mg by mouth daily.   FOLIC ACID (FOLVITE) 1 MG TABLET    Take 1 mg by mouth daily.   FUROSEMIDE (LASIX) 20 MG TABLET    Take 20 mg by mouth. One a week for weigh gain 3-5Ibs as needed.   LEVOTHYROXINE (SYNTHROID, LEVOTHROID) 125 MCG TABLET    Take 100 mcg by mouth daily. Except 125mcg Sat and Sun   METOPROLOL SUCCINATE (TOPROL-XL) 100 MG 24 HR TABLET    Take 100 mg by mouth 2 (two) times daily. Take with or immediately following a meal.   MULTIPLE VITAMINS-MINERALS (PRESERVISION/LUTEIN) CAPS    Take by mouth 2 (two) times daily.     PROBIOTIC PRODUCT (ALIGN) 4 MG CAPS    Take 4 mg by mouth daily.    VITAMIN B-12 (CYANOCOBALAMIN) 1000 MCG TABLET    Take 1,000 mcg by  mouth daily.   WARFARIN (COUMADIN) 1 MG TABLET  Take by mouth as directed.  Modified Medications   No medications on file  Discontinued Medications   No medications on file     Physical Exam: Physical Exam  Constitutional: She is oriented to person, place, and time. She appears well-developed and well-nourished. No distress.  HENT:  Head: Normocephalic and atraumatic.  Right Ear: Tympanic membrane and external ear normal. No lacerations. No drainage, swelling or tenderness. No foreign bodies. No mastoid tenderness. Tympanic membrane is not injected, not scarred, not perforated, not erythematous, not retracted and not bulging. Tympanic membrane mobility is normal. No middle ear effusion. No hemotympanum. Decreased hearing is noted.  Left Ear: Tympanic membrane and external ear normal. No lacerations. No drainage, swelling or tenderness. No foreign bodies. No mastoid tenderness. Tympanic membrane is not injected, not scarred, not perforated, not erythematous, not retracted and not bulging. Tympanic membrane mobility is normal.  No middle ear effusion. No hemotympanum. No decreased hearing is noted.  Eyes: Conjunctivae and EOM are normal. Pupils are equal, round, and reactive to light.  Neck: Normal range of motion. Neck supple. No JVD present. No thyromegaly present.  Cardiovascular: Normal rate.  An irregular rhythm present.  Murmur heard.  Systolic murmur is present with a grade of 2/6  Left upper chest pacemaker.   Pulmonary/Chest: Effort normal. She has no wheezes. She has rales (bibasilar dry rales). She exhibits no tenderness.  Dry rales bibasilar.   Abdominal: Soft. Bowel sounds are normal. There is no tenderness.  Musculoskeletal: Normal range of motion. She exhibits edema (BLE. L>R left ankle. ).  Lymphadenopathy:    She has no cervical adenopathy.  Neurological: She is alert and oriented to person, place, and time. She has normal reflexes. No cranial nerve deficit. She exhibits  normal muscle tone. Coordination normal.  Skin: Skin is warm and dry. No rash noted. She is not diaphoretic. Erythema: bilateral lower leg pigmentation.  Chronic BLE venous insufficiency. Purplish BLE when in dependent position with several keratotic plaques anterior ly.   Inflamed oral cavity linings.    Psychiatric: Her speech is normal. Her affect is not angry, not blunt and not inappropriate. She is not aggressive. Thought content is not paranoid and not delusional. Cognition and memory are impaired. She expresses impulsivity. She does not express inappropriate judgment. She exhibits a depressed mood. She exhibits abnormal recent memory.    Filed Vitals:   02/26/13 1026  BP: 128/72  Pulse: 62  Temp: 98.1 F (36.7 C)  TempSrc: Tympanic  Resp: 18      Labs reviewed: Basic Metabolic Panel:  Recent Labs  16/10/96 09/03/12 10/04/12 10/29/12 12/24/12 01/14/13  NA 140  --  142  --   --  142  K 4.8  --  4.5  --   --  3.9  BUN 42*  --  20  --   --  14  CREATININE 1.4*  --  1.0  --   --  1.1  TSH  --  0.26*  --  5.30 2.68  --    Liver Function Tests:  Recent Labs  01/14/13  AST 20  ALT 9  ALKPHOS 72   CBC:  Recent Labs  07/26/12 09/03/12 01/14/13  WBC 6.1 5.0 5.7  HGB 11.9* 11.8* 13.3  HCT 36 35* 40  PLT 147* 140* 158   Past Procedures:  01/09/13 CXR borderline cardiomegaly unchanged without pulmonary vascular congestion or pleural effusion, old granulomatous disease unchanged, mild interstitial findings area seen at both lower lungs unchanged and  likely chronic, no inflammatory consolidate or suspicious nodule.     Assessment/Plan Atrial fibrillation Rate controlled, off Dig, takes Coumadin for risk reduction.                             Hypothyroidism Levothyroxine M-F, Sat + Sun-TSH 2.68 12/24/12                          Chronic anticoagulation subtherapeutic INR 1.52 02/25/13, increased Coumadin  and repeat INR 03/04/13  Depression Sad facial looks, takes Lexapro 5mg                          HYPERTENSION, BENIGN Elevated mildly, takes Clonidine 0.1mg (was reduced to 0.2mg  06/29/12), Enalapril 20mg  bid, Metoprolol 100mg  bid.                 Acute blood loss anemia Hgb 13.3 01/14/13, takes B12 and Folic acid.         Mild cognitive impairment Resides at SNF, takes Aricept, progressed rapidly in the past year.                 Left ear pain Ear was removed, no external ear canal erythema or open areas, TM wnl except diffused dullness. The patient stated her left ear pain is much better before ear wax was removed and no pain at all after it was removed. Monitor the patient.     Family/ Staff Communication: observe the patient.   Goals of Care: SNF  Labs/tests ordered: PT/INR 03/04/13

## 2013-03-06 ENCOUNTER — Encounter: Payer: Self-pay | Admitting: *Deleted

## 2013-03-06 ENCOUNTER — Encounter: Payer: Self-pay | Admitting: Internal Medicine

## 2013-03-19 ENCOUNTER — Encounter: Payer: Self-pay | Admitting: Nurse Practitioner

## 2013-03-19 ENCOUNTER — Non-Acute Institutional Stay (SKILLED_NURSING_FACILITY): Payer: Medicare Other | Admitting: Nurse Practitioner

## 2013-03-19 DIAGNOSIS — I4891 Unspecified atrial fibrillation: Secondary | ICD-10-CM

## 2013-03-19 DIAGNOSIS — E039 Hypothyroidism, unspecified: Secondary | ICD-10-CM

## 2013-03-19 DIAGNOSIS — F3289 Other specified depressive episodes: Secondary | ICD-10-CM

## 2013-03-19 DIAGNOSIS — Z7901 Long term (current) use of anticoagulants: Secondary | ICD-10-CM

## 2013-03-19 DIAGNOSIS — G3184 Mild cognitive impairment, so stated: Secondary | ICD-10-CM

## 2013-03-19 DIAGNOSIS — F329 Major depressive disorder, single episode, unspecified: Secondary | ICD-10-CM

## 2013-03-19 DIAGNOSIS — F32A Depression, unspecified: Secondary | ICD-10-CM

## 2013-03-19 DIAGNOSIS — D62 Acute posthemorrhagic anemia: Secondary | ICD-10-CM

## 2013-03-19 DIAGNOSIS — R609 Edema, unspecified: Secondary | ICD-10-CM

## 2013-03-19 DIAGNOSIS — I509 Heart failure, unspecified: Secondary | ICD-10-CM

## 2013-03-19 DIAGNOSIS — I10 Essential (primary) hypertension: Secondary | ICD-10-CM

## 2013-03-19 NOTE — Progress Notes (Signed)
Patient ID: [name], female   DOB: 14-Oct-1917, 80 y.o.   MRN: [id number]   Code Status: DNR  Allergies  Allergen Reactions  . Asacol [Mesalamine]   . Aspirin Other (See Comments)    Per mar  . Butalbital-Asa-Caff-Codeine   . Codeine Other (See Comments)    Per mar    Chief Complaint  Patient presents with  . Medical Managment of Chronic Issues    HPI: Patient is a 80 y.o. female seen in the SNF at [facility] today for evaluation of left ear pain and other chronic medical conditions.  Problem List Items Addressed This Visit   Mild cognitive impairment     Resides at SNF, takes Aricept, progressed rapidly in the past year.       Hypothyroidism     Levothyroxine M-F, Sat + Sun-TSH 2.68 12/24/12     HYPERTENSION, BENIGN     Controlled, takes Clonidine 0.1mg (was reduced to 0.2mg  06/29/12), Enalapril 20mg  bid, Metoprolol 100mg  bid.         Edema - Primary (Chronic)     continue to monitor weight and BLE edema1+, Prn Furosemide is adequate.    Depression     Sad facial looks, takes Lexapro 5mg      Chronic anticoagulation     subtherapeutic INR 1.52 02/25/13, increased Coumadin and repeat INR 03/04/13     CHF (congestive heart failure)     Compensated clinically, only trace edema to 1+ in ankles, no weight gain, bilateral mid/lower lung dry rales as prior.    Atrial fibrillation     Rate controlled, off Dig, takes Coumadin for risk reduction.       Acute blood loss anemia     Hgb 13.3 01/14/13         Review of Systems:  Review of Systems  Constitutional: Negative for fever, chills, weight loss, malaise/fatigue and diaphoresis.  HENT: Positive for ear pain and hearing loss. Negative for congestion, ear discharge and sore throat.        Left   Eyes: Negative for pain, discharge and redness.  Respiratory: Negative for cough, sputum production and wheezing.   Cardiovascular: Positive for PND. Negative for chest pain, palpitations,  orthopnea and claudication. Leg swelling: ankle/feet edeam, L>R.  Gastrointestinal: Negative for heartburn, nausea, vomiting, abdominal pain, diarrhea, constipation and blood in stool.       Pill dysphagia  Genitourinary: Positive for frequency (incontinent of bladder). Negative for dysuria, urgency, hematuria and flank pain.  Musculoskeletal: Positive for joint pain. Negative for back pain, falls, myalgias and neck pain.  Skin: Negative for itching and rash.       Chronic BLE venous insufficiency. Purplish BLE when in dependent position with several keratotic plaques anterior ly.    Neurological: Negative for dizziness, tingling, tremors, sensory change, speech change, focal weakness, seizures, loss of consciousness and weakness.  Endo/Heme/Allergies: Negative for environmental allergies and polydipsia. Bruises/bleeds easily.  Psychiatric/Behavioral: Positive for depression and memory loss. Negative for suicidal ideas, hallucinations and substance abuse. The patient is not nervous/anxious and does not have insomnia.      Past Medical History  Diagnosis Date  . Hypertension   . Hypothyroidism   . Osteoarthritis   . Osteoporosis   . Diverticulosis   . Chronic diarrhea   . Macular degeneration   . Kyphosis     of thoracic spine  . History of GI diverticular bleed Sept 3, 2003  . Toxic goiter     hx  w subsequent ablation and hypothyroid state  . Mild cognitive disorder   . Bronchiectasis     left lower lobe noted on x-ray 2/05  . Hiatal hernia     hx w repair  . Urinary incontinence   . Urinary tract bacterial infections   . Atrial fibrillation Sept 12, 2005    new onset  . Right renal atrophy 2005  . Pacemaker   . H/O Clostridium difficile infection   . Macular degeneration   . Diverticulosis    Past Surgical History  Procedure Laterality Date  . Repair of hiatal hernia and tube, gastrostomy.  08/06/1999  . Total knee arthroplasty  10/13/2003    Right  . Laparoscopic repair  of ventral incisional hernia with  04/21/2000  . Colonoscopy  02/09/2012    Procedure: COLONOSCOPY;  Surgeon: Rachael Fee, MD;  Location: [facility];  Service: Endoscopy;  Laterality: N/A;   Social History:   reports that she has never smoked. She has never used smokeless tobacco. She reports that she does not drink alcohol or use illicit drugs.  Family History  Problem Relation Age of Onset  . Aneurysm Father   . Cancer Daughter     Breast and lung cancer  . Hypertension Son   . Hyperlipidemia Son     Medications: Patient's Medications  New Prescriptions   No medications on file  Previous Medications   ACETAMINOPHEN (TYLENOL) 500 MG TABLET    Take 500 mg by mouth every 4 (four) hours as needed.    CLONIDINE (CATAPRES) 0.2 MG TABLET    Take 0.1 mg by mouth daily.    DONEPEZIL (ARICEPT) 10 MG TABLET    Take 10 mg by mouth at bedtime.    ENALAPRIL (VASOTEC) 10 MG TABLET    Take 20 mg by mouth 2 (two) times daily.   ESCITALOPRAM (LEXAPRO) 5 MG TABLET    Take 5 mg by mouth daily.   FOLIC ACID (FOLVITE) 1 MG TABLET    Take 1 mg by mouth daily.   FUROSEMIDE (LASIX) 20 MG TABLET    Take 20 mg by mouth. One a week for weigh gain 3-5Ibs as needed.   LEVOTHYROXINE (SYNTHROID, LEVOTHROID) 125 MCG TABLET    Take 100 mcg by mouth daily. Except Sat and Sun   METOPROLOL SUCCINATE (TOPROL-XL) 100 MG 24 HR TABLET    Take 100 mg by mouth 2 (two) times daily. Take with or immediately following a meal.   MULTIPLE VITAMINS-MINERALS (PRESERVISION/LUTEIN) CAPS    Take by mouth 2 (two) times daily.     PROBIOTIC PRODUCT (ALIGN) 4 MG CAPS    Take 4 mg by mouth daily.    VITAMIN B-12 (CYANOCOBALAMIN) 1000 MCG TABLET    Take 1,000 mcg by mouth daily.   WARFARIN (COUMADIN) 1 MG TABLET    Take by mouth as directed.  Modified Medications   No medications on file  Discontinued Medications   No medications on file     Physical Exam: Physical Exam  Constitutional: She is oriented to person,  place, and time. She appears well-developed and well-nourished. No distress.  HENT:  Head: Normocephalic and atraumatic.  Right Ear: Tympanic membrane and external ear normal. No lacerations. No drainage, swelling or tenderness. No foreign bodies. No mastoid tenderness. Tympanic membrane is not injected, not scarred, not perforated, not erythematous, not retracted and not bulging. Tympanic membrane mobility is normal. No middle ear effusion. No hemotympanum. Decreased hearing is noted.  Left Ear: Tympanic  membrane and external ear normal. No lacerations. No drainage, swelling or tenderness. No foreign bodies. No mastoid tenderness. Tympanic membrane is not injected, not scarred, not perforated, not erythematous, not retracted and not bulging. Tympanic membrane mobility is normal.  No middle ear effusion. No hemotympanum. No decreased hearing is noted.  Eyes: Conjunctivae and EOM are normal. Pupils are equal, round, and reactive to light.  Neck: Normal range of motion. Neck supple. No JVD present. No thyromegaly present.  Cardiovascular: Normal rate.  An irregular rhythm present.  Murmur heard.  Systolic murmur is present with a grade of 2/6  Left upper chest pacemaker.   Pulmonary/Chest: Effort normal. She has no wheezes. She has rales (bibasilar dry rales). She exhibits no tenderness.  Dry rales bibasilar.   Abdominal: Soft. Bowel sounds are normal. There is no tenderness.  Musculoskeletal: Normal range of motion. She exhibits edema (BLE. L>R left ankle. ).  Trace to 1+ edema seen in BLE  Lymphadenopathy:    She has no cervical adenopathy.  Neurological: She is alert and oriented to person, place, and time. She has normal reflexes. No cranial nerve deficit. She exhibits normal muscle tone. Coordination normal.  Skin: Skin is warm and dry. No rash noted. She is not diaphoretic. Erythema: bilateral lower leg pigmentation.  Chronic BLE venous insufficiency. Purplish BLE when in dependent position  with several keratotic plaques anterior ly.   Inflamed oral cavity linings.    Psychiatric: Her speech is normal. Her affect is not angry, not blunt and not inappropriate. She is not aggressive. Thought content is not paranoid and not delusional. Cognition and memory are impaired. She expresses impulsivity. She does not express inappropriate judgment. She exhibits a depressed mood. She exhibits abnormal recent memory.    Filed Vitals:   03/19/13 2130  BP: 139/84  Pulse: 74  Temp: 98.6 F (37 C)  TempSrc: Tympanic  Resp: 16      Labs reviewed: Basic Metabolic Panel:  Recent Labs  91/47/8207/28/14 09/03/12 10/04/12 10/29/12 12/24/12 01/14/13  NA 140  --  142  --   --  142  K 4.8  --  4.5  --   --  3.9  BUN 42*  --  20  --   --  14  CREATININE 1.4*  --  1.0  --   --  1.1  TSH  --  0.26*  --  5.30 2.68  --    Liver Function Tests:  Recent Labs  01/14/13  AST 20  ALT 9  ALKPHOS 72   CBC:  Recent Labs  07/26/12 09/03/12 01/14/13  WBC 6.1 5.0 5.7  HGB 11.9* 11.8* 13.3  HCT 36 35* 40  PLT 147* 140* 158   Past Procedures:  01/09/13 CXR borderline cardiomegaly unchanged without pulmonary vascular congestion or pleural effusion, old granulomatous disease unchanged, mild interstitial findings area seen at both lower lungs unchanged and likely chronic, no inflammatory consolidate or suspicious nodule.     Assessment/Plan Edema continue to monitor weight and BLE edema1+, Prn Furosemide is adequate.  HYPERTENSION, BENIGN Controlled, takes Clonidine 0.1mg (was reduced to 0.2mg  06/29/12), Enalapril 20mg  bid, Metoprolol 100mg  bid.       Atrial fibrillation Rate controlled, off Dig, takes Coumadin for risk reduction.     Acute blood loss anemia Hgb 13.3 01/14/13    Hypothyroidism Levothyroxine 100mcg M-F, 125mcg Sat + Sun-TSH 2.68 12/24/12   Mild cognitive impairment Resides at SNF, takes Aricept, progressed rapidly in the past year.     Chronic  anticoagulation subtherapeutic INR 1.52 02/25/13, increased Coumadin and repeat INR 03/04/13   Depression Sad facial looks, takes Lexapro 5mg    CHF (congestive heart failure) Compensated clinically, only trace edema to 1+ in ankles, no weight gain, bilateral mid/lower lung dry rales as prior.    Family/ Staff Communication: observe the patient.   Goals of Care: SNF  Labs/tests ordered: none

## 2013-03-21 NOTE — Assessment & Plan Note (Signed)
Rate controlled, off Dig, takes Coumadin for risk reduction.

## 2013-03-21 NOTE — Assessment & Plan Note (Signed)
Hgb 13.3 01/14/13

## 2013-03-21 NOTE — Assessment & Plan Note (Signed)
continue to monitor weight and BLE edema1+, Prn Furosemide is adequate.

## 2013-03-21 NOTE — Assessment & Plan Note (Signed)
Levothyroxine 100mcg M-F, 125mcg Sat + Sun-TSH 2.68 12/24/12                         

## 2013-03-21 NOTE — Assessment & Plan Note (Signed)
Resides at SNF, takes Aricept, progressed rapidly in the past year.    

## 2013-03-21 NOTE — Assessment & Plan Note (Signed)
subtherapeutic INR 1.52 02/25/13, increased Coumadin and repeat INR 03/04/13

## 2013-03-21 NOTE — Assessment & Plan Note (Signed)
Controlled, takes Clonidine 0.1mg(was reduced to 0.2mg 06/29/12), Enalapril 20mg bid, Metoprolol 100mg bid.    

## 2013-03-21 NOTE — Assessment & Plan Note (Signed)
Compensated clinically, only trace edema to 1+ in ankles, no weight gain, bilateral mid/lower lung dry rales as prior.

## 2013-03-21 NOTE — Assessment & Plan Note (Signed)
Sad facial looks, takes Lexapro 5mg 

## 2013-03-26 ENCOUNTER — Encounter: Payer: Self-pay | Admitting: Internal Medicine

## 2013-03-30 ENCOUNTER — Encounter (HOSPITAL_COMMUNITY): Payer: Self-pay | Admitting: Emergency Medicine

## 2013-03-30 ENCOUNTER — Inpatient Hospital Stay (HOSPITAL_COMMUNITY)
Admission: EM | Admit: 2013-03-30 | Discharge: 2013-04-02 | DRG: 378 | Disposition: A | Discharge status: Skilled Nursing Facility | Payer: Medicare Other | Attending: Internal Medicine | Admitting: Internal Medicine

## 2013-03-30 DIAGNOSIS — R609 Edema, unspecified: Secondary | ICD-10-CM

## 2013-03-30 DIAGNOSIS — M810 Age-related osteoporosis without current pathological fracture: Secondary | ICD-10-CM | POA: Diagnosis present

## 2013-03-30 DIAGNOSIS — I4891 Unspecified atrial fibrillation: Secondary | ICD-10-CM | POA: Diagnosis present

## 2013-03-30 DIAGNOSIS — F32A Depression, unspecified: Secondary | ICD-10-CM

## 2013-03-30 DIAGNOSIS — F0390 Unspecified dementia without behavioral disturbance: Secondary | ICD-10-CM | POA: Diagnosis present

## 2013-03-30 DIAGNOSIS — Z950 Presence of cardiac pacemaker: Secondary | ICD-10-CM

## 2013-03-30 DIAGNOSIS — I509 Heart failure, unspecified: Secondary | ICD-10-CM

## 2013-03-30 DIAGNOSIS — K922 Gastrointestinal hemorrhage, unspecified: Principal | ICD-10-CM

## 2013-03-30 DIAGNOSIS — R55 Syncope and collapse: Secondary | ICD-10-CM

## 2013-03-30 DIAGNOSIS — E0500 Thyrotoxicosis with diffuse goiter without thyrotoxic crisis or storm: Secondary | ICD-10-CM | POA: Diagnosis present

## 2013-03-30 DIAGNOSIS — I498 Other specified cardiac arrhythmias: Secondary | ICD-10-CM

## 2013-03-30 DIAGNOSIS — G3184 Mild cognitive impairment, so stated: Secondary | ICD-10-CM | POA: Diagnosis present

## 2013-03-30 DIAGNOSIS — E039 Hypothyroidism, unspecified: Secondary | ICD-10-CM | POA: Diagnosis present

## 2013-03-30 DIAGNOSIS — F329 Major depressive disorder, single episode, unspecified: Secondary | ICD-10-CM

## 2013-03-30 DIAGNOSIS — I10 Essential (primary) hypertension: Secondary | ICD-10-CM | POA: Diagnosis present

## 2013-03-30 DIAGNOSIS — K625 Hemorrhage of anus and rectum: Secondary | ICD-10-CM | POA: Diagnosis present

## 2013-03-30 DIAGNOSIS — I129 Hypertensive chronic kidney disease with stage 1 through stage 4 chronic kidney disease, or unspecified chronic kidney disease: Secondary | ICD-10-CM | POA: Diagnosis present

## 2013-03-30 DIAGNOSIS — Z8719 Personal history of other diseases of the digestive system: Secondary | ICD-10-CM

## 2013-03-30 DIAGNOSIS — H3530 Unspecified macular degeneration: Secondary | ICD-10-CM | POA: Diagnosis present

## 2013-03-30 DIAGNOSIS — N189 Chronic kidney disease, unspecified: Secondary | ICD-10-CM | POA: Diagnosis present

## 2013-03-30 DIAGNOSIS — D62 Acute posthemorrhagic anemia: Secondary | ICD-10-CM | POA: Diagnosis present

## 2013-03-30 DIAGNOSIS — Z993 Dependence on wheelchair: Secondary | ICD-10-CM

## 2013-03-30 DIAGNOSIS — Z7901 Long term (current) use of anticoagulants: Secondary | ICD-10-CM

## 2013-03-30 DIAGNOSIS — R634 Abnormal weight loss: Secondary | ICD-10-CM

## 2013-03-30 DIAGNOSIS — Z681 Body mass index (BMI) 19 or less, adult: Secondary | ICD-10-CM

## 2013-03-30 DIAGNOSIS — R1310 Dysphagia, unspecified: Secondary | ICD-10-CM

## 2013-03-30 DIAGNOSIS — K5730 Diverticulosis of large intestine without perforation or abscess without bleeding: Secondary | ICD-10-CM

## 2013-03-30 DIAGNOSIS — Z66 Do not resuscitate: Secondary | ICD-10-CM | POA: Diagnosis present

## 2013-03-30 DIAGNOSIS — Z96659 Presence of unspecified artificial knee joint: Secondary | ICD-10-CM

## 2013-03-30 DIAGNOSIS — F09 Unspecified mental disorder due to known physiological condition: Secondary | ICD-10-CM | POA: Diagnosis present

## 2013-03-30 DIAGNOSIS — J479 Bronchiectasis, uncomplicated: Secondary | ICD-10-CM | POA: Diagnosis present

## 2013-03-30 LAB — CBC WITH DIFFERENTIAL/PLATELET
Basophils Absolute: 0.1 10*3/uL (ref 0.0–0.1)
Basophils Relative: 1 % (ref 0–1)
EOS PCT: 5 % (ref 0–5)
Eosinophils Absolute: 0.5 10*3/uL (ref 0.0–0.7)
HCT: 33.7 % — ABNORMAL LOW (ref 36.0–46.0)
Hemoglobin: 10.8 g/dL — ABNORMAL LOW (ref 12.0–15.0)
LYMPHS ABS: 2.1 10*3/uL (ref 0.7–4.0)
LYMPHS PCT: 23 % (ref 12–46)
MCH: 30.4 pg (ref 26.0–34.0)
MCHC: 32 g/dL (ref 30.0–36.0)
MCV: 94.9 fL (ref 78.0–100.0)
Monocytes Absolute: 0.7 10*3/uL (ref 0.1–1.0)
Monocytes Relative: 8 % (ref 3–12)
Neutro Abs: 5.5 10*3/uL (ref 1.7–7.7)
Neutrophils Relative %: 63 % (ref 43–77)
Platelets: 239 10*3/uL (ref 150–400)
RBC: 3.55 MIL/uL — AB (ref 3.87–5.11)
RDW: 15.8 % — ABNORMAL HIGH (ref 11.5–15.5)
WBC: 8.8 10*3/uL (ref 4.0–10.5)

## 2013-03-30 LAB — CBC AND DIFFERENTIAL
HCT: 33 % — AB (ref 36–46)
HEMOGLOBIN: 10.7 g/dL — AB (ref 12.0–16.0)
Platelets: 205 10*3/uL (ref 150–399)
WBC: 6.8 10*3/mL

## 2013-03-30 LAB — PROTIME-INR
INR: 3.06 — AB (ref 0.00–1.49)
PROTHROMBIN TIME: 30.5 s — AB (ref 11.6–15.2)

## 2013-03-30 LAB — I-STAT CHEM 8, ED
BUN: 28 mg/dL — AB (ref 6–23)
CREATININE: 1 mg/dL (ref 0.50–1.10)
Calcium, Ion: 1.17 mmol/L (ref 1.13–1.30)
Chloride: 106 mEq/L (ref 96–112)
Glucose, Bld: 116 mg/dL — ABNORMAL HIGH (ref 70–99)
HCT: 33 % — ABNORMAL LOW (ref 36.0–46.0)
HEMOGLOBIN: 11.2 g/dL — AB (ref 12.0–15.0)
POTASSIUM: 4.2 meq/L (ref 3.7–5.3)
SODIUM: 143 meq/L (ref 137–147)
TCO2: 23 mmol/L (ref 0–100)

## 2013-03-30 LAB — BASIC METABOLIC PANEL
BUN: 23 mg/dL — AB (ref 4–21)
CREATININE: 0.9 mg/dL (ref 0.5–1.1)
GLUCOSE: 95 mg/dL
POTASSIUM: 4.3 mmol/L (ref 3.4–5.3)
SODIUM: 139 mmol/L (ref 137–147)

## 2013-03-30 LAB — I-STAT TROPONIN, ED: Troponin i, poc: 0.02 ng/mL (ref 0.00–0.08)

## 2013-03-30 NOTE — ED Notes (Addendum)
Pt w/ copious amount of frank bright red blood per rectum.  Pt given bed bath and complete linen change.  Pt's daughter Andrey CampanileSandy is at the bedside.   Pt is alert to self and place only.

## 2013-03-30 NOTE — ED Provider Notes (Signed)
CSN: [id number]     Arrival date & time 03/30/13  2121 History   First MD Initiated Contact with Patient 03/30/13 2135     Chief Complaint  Patient presents with  . Rectal Bleeding   Level V caveat due to dementia  (Consider location/radiation/quality/duration/timing/severity/associated sxs/prior Treatment) Patient is a 80 y.o. female presenting with hematochezia. The history is provided by the patient.  Rectal Bleeding patient presents from the nursing home with rectal bleeding. Patient reportedly did not want to come. Had had tachycardia with A. fib RVR for EMS. She is on Coumadin for atrial fibrillation. She denies chest pain. She does have some dementia. She states she has had blood, however she called EMS she has not been bleeding. Patient states it was red blood, the nursing home says it was coffee ground blood.  Past Medical History  Diagnosis Date  . Hypertension   . Hypothyroidism   . Osteoarthritis   . Osteoporosis   . Diverticulosis   . Chronic diarrhea   . Macular degeneration   . Kyphosis     of thoracic spine  . History of GI diverticular bleed Sept 3, 2003  . Toxic goiter     hx w subsequent ablation and hypothyroid state  . Mild cognitive disorder   . Bronchiectasis     left lower lobe noted on x-ray 2/05  . Hiatal hernia     hx w repair  . Urinary incontinence   . Urinary tract bacterial infections   . Atrial fibrillation Sept 12, 2005    new onset  . Right renal atrophy 2005  . Pacemaker   . H/O Clostridium difficile infection   . Macular degeneration   . Diverticulosis    Past Surgical History  Procedure Laterality Date  . Repair of hiatal hernia and tube, gastrostomy.  08/06/1999  . Total knee arthroplasty  10/13/2003    Right  . Laparoscopic repair of ventral incisional hernia with  04/21/2000  . Colonoscopy  02/09/2012    Procedure: COLONOSCOPY;  Surgeon: Rachael Fee, MD;  Location: [facility];  Service: Endoscopy;  Laterality: N/A;   Family  History  Problem Relation Age of Onset  . Aneurysm Father   . Cancer Daughter     Breast and lung cancer  . Hypertension Son   . Hyperlipidemia Son    History  Substance Use Topics  . Smoking status: Never Smoker   . Smokeless tobacco: Never Used  . Alcohol Use: No     Comment: Occasional wine.   OB History   Grav Para Term Preterm Abortions TAB SAB Ect Mult Living                 Review of Systems  Unable to perform ROS Gastrointestinal: Positive for hematochezia.      Allergies  Asacol; Aspirin; Butalbital-asa-caff-codeine; and Codeine  Home Medications   No current outpatient prescriptions on file. BP 169/95  Pulse 105  Temp(Src) 97.2 F (36.2 C) (Axillary)  Resp 18  Ht 5\' 6"  (1.676 m)  Wt 111 lb 8.8 oz (50.6 kg)  BMI 18.01 kg/m2  SpO2 100% Physical Exam  Constitutional: She appears well-developed and well-nourished.  Neck: Neck supple.  Cardiovascular:  Tachycardia   Pulmonary/Chest: Effort normal.  Abdominal: Soft. There is no tenderness.  Musculoskeletal: She exhibits no edema.  Neurological: She is alert.  Mild confusion  Skin:  Patient with areas of bruising on both hands and somewhat on both legs.    ED Course  Procedures (including critical care time) Labs Review Labs Reviewed  CBC WITH DIFFERENTIAL - Abnormal; Notable for the following:    RBC 3.55 (*)    Hemoglobin 10.8 (*)    HCT 33.7 (*)    RDW 15.8 (*)    All other components within normal limits  PROTIME-INR - Abnormal; Notable for the following:    Prothrombin Time 30.5 (*)    INR 3.06 (*)    All other components within normal limits  CBC - Abnormal; Notable for the following:    RBC 2.60 (*)    Hemoglobin 7.9 (*)    HCT 24.6 (*)    RDW 15.9 (*)    All other components within normal limits  PROTIME-INR - Abnormal; Notable for the following:    Prothrombin Time 22.3 (*)    INR 2.03 (*)    All other components within normal limits  BASIC METABOLIC PANEL - Abnormal;  Notable for the following:    GFR calc non Af Amer 54 (*)    GFR calc Af Amer 63 (*)    All other components within normal limits  I-STAT CHEM 8, ED - Abnormal; Notable for the following:    BUN 28 (*)    Glucose, Bld 116 (*)    Hemoglobin 11.2 (*)    HCT 33.0 (*)    All other components within normal limits  MRSA PCR SCREENING  TSH  CBC  CBC  CBC  I-STAT TROPOININ, ED  TYPE AND SCREEN  PREPARE RBC (CROSSMATCH)  PREPARE FRESH FROZEN PLASMA   Imaging Review No results found.   EKG Interpretation None      MDM   Final diagnoses:  GI bleeding  Atrial fibrillation with RVR    Patient was sent in from nursing home with GI bleed. Frank blood in diaper. Hemoglobin is stable from earlier today and her baseline. Also found to be in A. fib RVR. She will spontaneously convert back to a normal rate. She has an INR of 3. Patient was given 1 unit of PRBCs and 1 unit of FFP. Admitted to internal medicine. Patient is a DO NOT RESUSCITATE. Patient was not initially given any rate control medications, but feel that she has potential for hypotension with active bleeding.  CRITICAL CARE Performed by: Billee CashingPICKERING,Raidyn Wassink R. Total critical care time:30 Critical care time was exclusive of separately billable procedures and treating other patients. Critical care was necessary to treat or prevent imminent or life-threatening deterioration. Critical care was time spent personally by me on the following activities: development of treatment plan with patient and/or surrogate as well as nursing, discussions with consultants, evaluation of patient's response to treatment, examination of patient, obtaining history from patient or surrogate, ordering and performing treatments and interventions, ordering and review of laboratory studies, ordering and review of radiographic studies, pulse oximetry and re-evaluation of patient's condition.     Juliet RudeNathan R. Rubin PayorPickering, MD 03/31/13 1504

## 2013-03-30 NOTE — ED Notes (Signed)
[facility]  Patient is alert with confusion.  Staff states that they saw coffee ground material in stool.  Patient denies any pain or complaint and stated that she did not want to be transported.

## 2013-03-31 ENCOUNTER — Encounter (HOSPITAL_COMMUNITY): Payer: Self-pay | Admitting: *Deleted

## 2013-03-31 DIAGNOSIS — K922 Gastrointestinal hemorrhage, unspecified: Principal | ICD-10-CM

## 2013-03-31 DIAGNOSIS — Z7901 Long term (current) use of anticoagulants: Secondary | ICD-10-CM

## 2013-03-31 DIAGNOSIS — I4891 Unspecified atrial fibrillation: Secondary | ICD-10-CM | POA: Diagnosis present

## 2013-03-31 DIAGNOSIS — K625 Hemorrhage of anus and rectum: Secondary | ICD-10-CM | POA: Diagnosis present

## 2013-03-31 DIAGNOSIS — I10 Essential (primary) hypertension: Secondary | ICD-10-CM

## 2013-03-31 DIAGNOSIS — D62 Acute posthemorrhagic anemia: Secondary | ICD-10-CM

## 2013-03-31 LAB — CBC
HEMATOCRIT: 24.6 % — AB (ref 36.0–46.0)
HEMATOCRIT: 24.7 % — AB (ref 36.0–46.0)
HEMATOCRIT: 26.5 % — AB (ref 36.0–46.0)
HEMOGLOBIN: 7.9 g/dL — AB (ref 12.0–15.0)
HEMOGLOBIN: 8.3 g/dL — AB (ref 12.0–15.0)
Hemoglobin: 7.9 g/dL — ABNORMAL LOW (ref 12.0–15.0)
MCH: 30.4 pg (ref 26.0–34.0)
MCH: 30.4 pg (ref 26.0–34.0)
MCH: 29.9 pg (ref 26.0–34.0)
MCHC: 32.1 g/dL (ref 30.0–36.0)
MCHC: 32 g/dL (ref 30.0–36.0)
MCHC: 31.3 g/dL (ref 30.0–36.0)
MCV: 94.6 fL (ref 78.0–100.0)
MCV: 95 fL (ref 78.0–100.0)
MCV: 95.3 fL (ref 78.0–100.0)
Platelets: 163 10*3/uL (ref 150–400)
Platelets: 156 10*3/uL (ref 150–400)
Platelets: 178 10*3/uL (ref 150–400)
RBC: 2.6 MIL/uL — AB (ref 3.87–5.11)
RBC: 2.6 MIL/uL — AB (ref 3.87–5.11)
RBC: 2.78 MIL/uL — ABNORMAL LOW (ref 3.87–5.11)
RDW: 15.9 % — ABNORMAL HIGH (ref 11.5–15.5)
RDW: 16 % — ABNORMAL HIGH (ref 11.5–15.5)
RDW: 15.9 % — AB (ref 11.5–15.5)
WBC: 5.2 10*3/uL (ref 4.0–10.5)
WBC: 5.4 10*3/uL (ref 4.0–10.5)
WBC: 5.5 10*3/uL (ref 4.0–10.5)

## 2013-03-31 LAB — BASIC METABOLIC PANEL
BUN: 21 mg/dL (ref 6–23)
CHLORIDE: 105 meq/L (ref 96–112)
CO2: 25 meq/L (ref 19–32)
CREATININE: 0.89 mg/dL (ref 0.50–1.10)
Calcium: 8.5 mg/dL (ref 8.4–10.5)
GFR calc Af Amer: 63 mL/min — ABNORMAL LOW (ref >90)
GFR calc non Af Amer: 54 mL/min — ABNORMAL LOW (ref >90)
GLUCOSE: 89 mg/dL (ref 70–99)
POTASSIUM: 3.7 meq/L (ref 3.7–5.3)
Sodium: 141 mEq/L (ref 137–147)

## 2013-03-31 LAB — PREPARE RBC (CROSSMATCH)

## 2013-03-31 LAB — PROTIME-INR
INR: 2.03 — ABNORMAL HIGH (ref 0.00–1.49)
Prothrombin Time: 22.3 seconds — ABNORMAL HIGH (ref 11.6–15.2)

## 2013-03-31 LAB — MRSA PCR SCREENING: MRSA by PCR: NEGATIVE

## 2013-03-31 LAB — TSH: TSH: 10.023 u[IU]/mL — AB (ref 0.350–4.500)

## 2013-03-31 MED ORDER — LEVOTHYROXINE SODIUM 125 MCG PO TABS
125.0000 ug | ORAL_TABLET | ORAL | Status: DC
  Administered 2013-03-31: 125 ug via ORAL
  Filled 2013-03-31 (×2): qty 1

## 2013-03-31 MED ORDER — FOLIC ACID 1 MG PO TABS
1.0000 mg | ORAL_TABLET | Freq: Every day | ORAL | Status: DC
  Administered 2013-03-31 – 2013-04-02 (×3): 1 mg via ORAL
  Filled 2013-03-31 (×4): qty 1

## 2013-03-31 MED ORDER — SODIUM CHLORIDE 0.9 % IV SOLN
INTRAVENOUS | Status: DC
  Administered 2013-03-31 – 2013-04-01 (×3): via INTRAVENOUS

## 2013-03-31 MED ORDER — ESCITALOPRAM OXALATE 5 MG PO TABS
5.0000 mg | ORAL_TABLET | Freq: Every morning | ORAL | Status: DC
  Administered 2013-03-31 – 2013-04-02 (×3): 5 mg via ORAL
  Filled 2013-03-31 (×4): qty 1

## 2013-03-31 MED ORDER — ALIGN 4 MG PO CAPS
4.0000 mg | ORAL_CAPSULE | Freq: Every morning | ORAL | Status: DC
  Administered 2013-03-31 – 2013-04-02 (×3): 4 mg via ORAL
  Filled 2013-03-31 (×4): qty 1

## 2013-03-31 MED ORDER — DONEPEZIL HCL 10 MG PO TABS
10.0000 mg | ORAL_TABLET | Freq: Every day | ORAL | Status: DC
  Administered 2013-03-31 – 2013-04-01 (×3): 10 mg via ORAL
  Filled 2013-03-31 (×5): qty 1

## 2013-03-31 MED ORDER — ACETAMINOPHEN 650 MG RE SUPP: 650.0000 mg | Freq: Four times a day (QID) | RECTAL | Status: DC | PRN

## 2013-03-31 MED ORDER — PROSIGHT PO TABS
1.0000 | ORAL_TABLET | Freq: Two times a day (BID) | ORAL | Status: DC
  Administered 2013-03-31 – 2013-04-02 (×5): 1 via ORAL
  Filled 2013-03-31 (×8): qty 1

## 2013-03-31 MED ORDER — METOPROLOL TARTRATE 100 MG PO TABS
100.0000 mg | ORAL_TABLET | Freq: Two times a day (BID) | ORAL | Status: DC
  Administered 2013-03-31 – 2013-04-02 (×6): 100 mg via ORAL
  Filled 2013-03-31 (×9): qty 1

## 2013-03-31 MED ORDER — SODIUM CHLORIDE 0.9 % IJ SOLN
3.0000 mL | Freq: Two times a day (BID) | INTRAMUSCULAR | Status: DC
  Administered 2013-03-31 (×2): 3 mL via INTRAVENOUS

## 2013-03-31 MED ORDER — VITAMIN B-12 1000 MCG PO TABS
1000.0000 ug | ORAL_TABLET | Freq: Every day | ORAL | Status: DC
  Administered 2013-03-31 – 2013-04-02 (×3): 1000 ug via ORAL
  Filled 2013-03-31 (×4): qty 1

## 2013-03-31 MED ORDER — PRESERVISION/LUTEIN PO CAPS: 1.0000 | ORAL_CAPSULE | Freq: Two times a day (BID) | ORAL | Status: DC

## 2013-03-31 MED ORDER — ACETAMINOPHEN 325 MG PO TABS: 650.0000 mg | ORAL_TABLET | Freq: Four times a day (QID) | ORAL | Status: DC | PRN

## 2013-03-31 MED ORDER — CLONIDINE HCL 0.1 MG PO TABS
0.1000 mg | ORAL_TABLET | Freq: Every morning | ORAL | Status: DC
  Administered 2013-03-31 – 2013-04-02 (×3): 0.1 mg via ORAL
  Filled 2013-03-31 (×4): qty 1

## 2013-03-31 MED ORDER — ENALAPRIL MALEATE 10 MG PO TABS
10.0000 mg | ORAL_TABLET | Freq: Every morning | ORAL | Status: DC
  Administered 2013-03-31 – 2013-04-02 (×3): 10 mg via ORAL
  Filled 2013-03-31 (×4): qty 1

## 2013-03-31 MED ORDER — LEVOTHYROXINE SODIUM 100 MCG PO TABS
100.0000 ug | ORAL_TABLET | ORAL | Status: DC
  Administered 2013-04-01 – 2013-04-02 (×2): 100 ug via ORAL
  Filled 2013-03-31 (×4): qty 1

## 2013-03-31 MED ORDER — DILTIAZEM HCL 25 MG/5ML IV SOLN
10.0000 mg | Freq: Once | INTRAVENOUS | Status: AC
  Administered 2013-03-31: 10 mg via INTRAVENOUS
  Filled 2013-03-31: qty 5

## 2013-03-31 MED ORDER — DILTIAZEM HCL 25 MG/5ML IV SOLN
10.0000 mg | Freq: Four times a day (QID) | INTRAVENOUS | Status: DC | PRN
  Filled 2013-03-31: qty 5

## 2013-03-31 NOTE — Progress Notes (Signed)
Patient seen and examined. Database reviewed. Discussed care with daughter and son-in-law at bedside. She comes in with hematochezia. Known to have had diverticular bleeds in the past. Has been kept on coumadin for a fib. Coumadin has been reversed. Transfuse for Hb <7. Had another episode of BRBPR earlier this am. Discussed risk-benefit of coumadin and we have decided to DC coumadin indefinitely at this point. Will continue to follow.  Peggye PittEstela Hernandez, MD [facility] Pager: [phone]

## 2013-03-31 NOTE — Progress Notes (Signed)
Patient had frequent PVC's. Still doing atrial fib and v.pacing. Had 10 beats of v.tach this morning, patient is asymptomatic. On call Lynch NP was made aware. Will continue to monitor patient.

## 2013-03-31 NOTE — Progress Notes (Signed)
Patient had one bloody stool, dark red, medium amount.

## 2013-03-31 NOTE — H&P (Signed)
[facility] History and Physical  Vicki MartinsMaxine B Salas WUJ:[id number] DOB: [birth year] DOA: 03/30/2013  Referring physician: Dr. Rubin PayorPickering PCP: Kimber RelicGREEN, [name], MD   Chief Complaint:  Bright red blood per rectum x 1 day  HPI:  80 year old female with history of A. fib on Coumadin, history of diverticular bleed, history of CHF, hypertension, mild dementia who was sent from skilled nursing facility for bright red blood per rectum on the day of admission. Patient denied any symptoms associated with it. She was admitted one year back with similar symptoms and had colonoscopy which did not show any source of bleeding but was thought to be secondary to diverticular bleed. Blood work done showed stable hemoglobin and hematocrit. INR was elevated to 3.06.  Patient denies headache, dizziness, fever, chills, nausea , vomiting, chest pain, palpitations, SOB, abdominal pain, bowel or urinary symptoms. Denies change in weight or appetite. At baseline she is wheelchair bound.  Course in the ED Patient was in A. fib with RVR with heart rate ranging from 90-160. Blood work done showed hemoglobin of 11.2 and hematocrit of 33. Chemistry was normal except for mildly elevated BUN of 28. INR was elevated to 3.06. Patient ordered for 1 unit FFP by ED physician. She was given 10 mg IV Cardizem post op for which pocket remained stable between 70-90.  [facility] called for admission to telemetry.  Review of Systems:  Constitutional: Denies fever, chills, diaphoresis, appetite change and fatigue.  HEENT: Denies  hearing loss, ear pain, congestion, sore throat, rhinorrhea, sneezing, mouth sores, trouble swallowing, neck pain, neck stiffness and tinnitus.   Respiratory: Denies SOB, DOE, cough, chest tightness,  and wheezing.   Cardiovascular: Denies chest pain, palpitations and leg swelling.  Gastrointestinal: Blood in stool, Denies nausea, vomiting, abdominal pain, diarrhea, constipation,  and abdominal  distention.  Genitourinary: Denies dysuria, urgency, frequency, hematuria, flank pain and difficulty urinating.  Endocrine : Denies hot or cold intolerance,polyuria, polydipsia. Musculoskeletal: Denies myalgias, back pain, joint swelling, arthralgias and gait problem.  Neurological: Denies dizziness, seizures, syncope, weakness, light-headedness, numbness and headaches.  Hematological: Easy bruising,  Past Medical History  Diagnosis Date  . Hypertension   . Hypothyroidism   . Osteoarthritis   . Osteoporosis   . Diverticulosis   . Chronic diarrhea   . Macular degeneration   . Kyphosis     of thoracic spine  . History of GI diverticular bleed Sept 3, 2003  . Toxic goiter     hx w subsequent ablation and hypothyroid state  . Mild cognitive disorder   . Bronchiectasis     left lower lobe noted on x-ray 2/05  . Hiatal hernia     hx w repair  . Urinary incontinence   . Urinary tract bacterial infections   . Atrial fibrillation Sept 12, 2005    new onset  . Right renal atrophy 2005  . Pacemaker   . H/O Clostridium difficile infection   . Macular degeneration   . Diverticulosis    Past Surgical History  Procedure Laterality Date  . Repair of hiatal hernia and tube, gastrostomy.  08/06/1999  . Total knee arthroplasty  10/13/2003    Right  . Laparoscopic repair of ventral incisional hernia with  04/21/2000  . Colonoscopy  02/09/2012    Procedure: COLONOSCOPY;  Surgeon: Rachael Feeaniel P Jacobs, MD;  Location: [facility];  Service: Endoscopy;  Laterality: N/A;   Social History:  reports that she has never smoked. She has never used smokeless tobacco. She reports that she  does not drink alcohol or use illicit drugs.  Allergies  Allergen Reactions  . Asacol [Mesalamine] Other (See Comments)    Unknown reaction  . Aspirin Other (See Comments)    Per mar  . Butalbital-Asa-Caff-Codeine Other (See Comments)    Unknown reaction  . Codeine Other (See Comments)    Per mar    Family History   Problem Relation Age of Onset  . Aneurysm Father   . Cancer Daughter     Breast and lung cancer  . Hypertension Son   . Hyperlipidemia Son     Prior to Admission medications   Medication Sig Start Date End Date Taking? Authorizing Provider  acetaminophen (TYLENOL) 500 MG tablet Take 500 mg by mouth every evening.    Yes Historical Provider, MD  cloNIDine (CATAPRES) 0.2 MG tablet Take 0.1 mg by mouth every morning.    Yes Historical Provider, MD  donepezil (ARICEPT) 10 MG tablet Take 10 mg by mouth at bedtime.    Yes Historical Provider, MD  enalapril (VASOTEC) 10 MG tablet Take 10 mg by mouth every morning.  11/20/12  Yes Brooke O Edmisten, PA-C  escitalopram (LEXAPRO) 5 MG tablet Take 5 mg by mouth every morning.    Yes Historical Provider, MD  folic acid (FOLVITE) 1 MG tablet Take 1 mg by mouth daily.   Yes Historical Provider, MD  levothyroxine (SYNTHROID, LEVOTHROID) 100 MCG tablet Take 100 mcg by mouth See admin instructions. Take 1 tablet daily Monday- Friday   Yes Historical Provider, MD  levothyroxine (SYNTHROID, LEVOTHROID) 125 MCG tablet Take 125 mcg by mouth See admin instructions. Take 1 tablet Saturday and Sunday   Yes Historical Provider, MD  metoprolol (LOPRESSOR) 100 MG tablet Take 100 mg by mouth 2 (two) times daily.   Yes Historical Provider, MD  Multiple Vitamins-Minerals (PRESERVISION/LUTEIN) CAPS Take 1 capsule by mouth 2 (two) times daily.    Yes Historical Provider, MD  Probiotic Product (ALIGN) 4 MG CAPS Take 4 mg by mouth every morning.    Yes Historical Provider, MD  vitamin B-12 (CYANOCOBALAMIN) 1000 MCG tablet Take 1,000 mcg by mouth daily.   Yes Historical Provider, MD  warfarin (COUMADIN) 1 MG tablet Take 0.5-1 mg by mouth daily at 6 PM. Take 0.5mg  on Monday, Wednesday, and Friday. Take 1mg  daily on all other days of the week.   Yes Historical Provider, MD     Physical Exam:  Filed Vitals:   03/30/13 2127  BP: 131/80  Pulse: 118  Temp: 98.3 F (36.8 C)   TempSrc: Oral  Resp: 20  SpO2: 98%    Constitutional: Vital signs reviewed.  Patient is an elderly female lying in bed in no acute distress HEENT: no pallor, no icterus, moist oral mucosa, Cardiovascular: S1-S2 is irregularly irregular, no MRG Chest: CTAB, no wheezes, rales, or rhonchi Abdominal: Soft. Non-tender, non-distended, bowel sounds are normal, no masses, organomegaly, or guarding present. Bright red blood per rectum Ext: warm, no edema Neurological: A&O x2, non focal  Labs on Admission:  Basic Metabolic Panel:  Recent Labs Lab 03/30/13 2230  NA 143  K 4.2  CL 106  GLUCOSE 116*  BUN 28*  CREATININE 1.00   Liver Function Tests: No results found for this basename: AST, ALT, ALKPHOS, BILITOT, PROT, ALBUMIN,  in the last 168 hours No results found for this basename: LIPASE, AMYLASE,  in the last 168 hours No results found for this basename: AMMONIA,  in the last 168 hours CBC:  Recent Labs  Lab 03/30/13 2220 03/30/13 2230  WBC 8.8  --   NEUTROABS 5.5  --   HGB 10.8* 11.2*  HCT 33.7* 33.0*  MCV 94.9  --   PLT 239  --    Cardiac Enzymes: No results found for this basename: CKTOTAL, CKMB, CKMBINDEX, TROPONINI,  in the last 168 hours BNP: No components found with this basename: POCBNP,  CBG: No results found for this basename: GLUCAP,  in the last 168 hours  Radiological Exams on Admission: No results found.  EKG: A. fib with RVR at 165, lateral ST depression.  Assessment/Plan  Principal Problem:   Bright red rectal bleeding Possibly diverticular with supratherapeutic INR on Coumadin. Admit to telemetry. Discontinue Coumadin. Ordered 1 unit FFP by ED physician. Monitor INR and serial H&H. Hemoglobin currently stable and does not need transfusion. Type and screen ordered. -Patient was admitted one year back with similar symptoms and had colonoscopy which did not show any active bleeding but thought to be secondary to diverticular bleed. Given second  episode of rectal bleeding while on Coumadin I think we need to weigh the risk vs benefit of  resuming Coumadin. Daughter agrees with discontinuing Coumadin if appropriate. Her CHADS2 score is 3 ( CHF per echo in 07, age and HTN) Please discuss with her cardiologist Dr. Graciela Husbands.    Active Problems:   Atrial fibrillation with RVR Heart rate elevated up to 140s-160s and improved to 70-80s after 10 mg IV Cardizem given in the ED. Monitor on telemetry. Resume home dose of metoprolol. -Discontinue Coumadin Check TSH  Hypertension Blood pressure elevated in the ED. Resume home dose of clonidine and lisinopril.  Anemia Hemoglobin appears stable at this time. Monitor every 6 hours    Hypothyroidism Continue Synthroid. Check TSH    Mild cognitive impairment Continue Aricept   History of CHF Last echo in the system from 2007  showing EF of 45-50%. Patient appears euvolemic. Monitor on gentle dehydration.    Diet: Clear liquids  DVT prophylaxis: SCDs   Code Status:  DNR Family Communication: discussed with daughter at bedside Disposition Plan: return to SNF upon discharge  Eddie North [facility] Pager [phone]  Total time spent on admission :70 minutes  If 7PM-7AM, please contact night-coverage [email or web address] Password [facility] 03/31/2013, 12:52 AM

## 2013-03-31 NOTE — Progress Notes (Signed)
Patient just got up here from ED. To be transfused with FFP, Tasha from lab said it will be available in about 30-45 min.

## 2013-04-01 LAB — CBC
HEMATOCRIT: 26 % — AB (ref 36.0–46.0)
HEMATOCRIT: 27.2 % — AB (ref 36.0–46.0)
HEMOGLOBIN: 8.1 g/dL — AB (ref 12.0–15.0)
HEMOGLOBIN: 8.7 g/dL — AB (ref 12.0–15.0)
MCH: 29.6 pg (ref 26.0–34.0)
MCH: 30.7 pg (ref 26.0–34.0)
MCHC: 31.2 g/dL (ref 30.0–36.0)
MCHC: 32 g/dL (ref 30.0–36.0)
MCV: 94.9 fL (ref 78.0–100.0)
MCV: 96.1 fL (ref 78.0–100.0)
Platelets: 173 10*3/uL (ref 150–400)
Platelets: 178 10*3/uL (ref 150–400)
RBC: 2.74 MIL/uL — ABNORMAL LOW (ref 3.87–5.11)
RBC: 2.83 MIL/uL — AB (ref 3.87–5.11)
RDW: 16.2 % — AB (ref 11.5–15.5)
RDW: 16 % — ABNORMAL HIGH (ref 11.5–15.5)
WBC: 6.8 10*3/uL (ref 4.0–10.5)
WBC: 6.6 10*3/uL (ref 4.0–10.5)

## 2013-04-01 LAB — PREPARE FRESH FROZEN PLASMA
UNIT DIVISION: 0
Unit division: 0

## 2013-04-01 NOTE — Progress Notes (Signed)
Clinical Social Work Department BRIEF PSYCHOSOCIAL ASSESSMENT 04/01/2013  Patient:  [name], [name]     Account Number:  [id number]     Admit date:  03/30/2013  Clinical Social Worker:  Venia Minks  Date/Time:  04/01/2013 12:00 M  Referred by:  Physician  Date Referred:  04/01/2013 Referred for  SNF Placement   Other Referral:   Interview type:  Patient Other interview type:   family    PSYCHOSOCIAL DATA Living Status:  FACILITY Admitted from facility:  [facility] Level of care:  [place] Primary support name:  [name] Primary support relationship to patient:  CHILD, ADULT Degree of support available:   excellent    CURRENT CONCERNS Current Concerns  Post-Acute Placement   Other Concerns:    SOCIAL WORK ASSESSMENT / PLAN CSW met with patient and patients daughter and son in law at bedside. patient is alert and oriented X3. patient confirms that [facility] and that she will return there upon discharge. she states that it is a lovely place and she is very happy there.   Assessment/plan status:   Other assessment/ plan:   Information/referral to community resources:    PATIENT'S/FAMILY'S RESPONSE TO PLAN OF CARE: family confirms that they wish patient to return upon discharge. they will transport her when she is ready to go.

## 2013-04-01 NOTE — Care Management Note (Signed)
    Page 1 of 1   04/01/2013     1:21:14 PM   CARE MANAGEMENT NOTE 04/01/2013  Patient:  Vicki MartinsBLACKWOOD,Charlisa B   Account Number:  [id number]  Date Initiated:  04/01/2013  Documentation initiated by:  [facility] Center[facility]ABIR,Brelynn Wheller  Subjective/Objective Assessment:   80 Y/O F ADMITTED W/BLOODY STOOLS,AFIB.     Action/Plan:   FROM [facility]   Anticipated DC Date:  04/02/2013   Anticipated DC Plan:  SKILLED NURSING FACILITY      DC Planning Services  CM consult      Choice offered to / List presented to:             Status of service:  In process, will continue to follow Medicare Important Message given?   (If response is "NO", the following Medicare IM given date fields will be blank) Date Medicare IM given:   Date Additional Medicare IM given:    Discharge Disposition:    Per UR Regulation:  Reviewed for med. necessity/level of care/duration of stay  If discussed at Long Length of Stay Meetings, dates discussed:    Comments:  04/01/13 Joseff Luckman RN,BSN NCM [phone] D/C PLAN RETURN SNF.

## 2013-04-01 NOTE — Progress Notes (Signed)
[facility] PROGRESS NOTE  Vicki MartinsMaxine B Salas JXB:[id number] DOB: [birth year] DOA: 03/30/2013 PCP: Kimber RelicGREEN, [name], MD  Assessment/Plan: Hematochezia -Known to have prior diverticular bleeds. -Holding Coumadin indefinitely. -No needs for transfusions  ABLA -Hb stable to slightly increased. -Has not received any PRBCs transfusion this admission. -Hb 8.7.  Atrial Fibrillation -Rate controlled. -Coumadin has been DC indefinitely.  Code Status: DNR Family Communication: Wife and son-in-law at bedside.  Disposition Plan: Back to SNF in am if Hb remains stable and no further bleeding.   Consultants:  None   Antibiotics:  None   Subjective: No complaints. No further bleeding episodes.  Objective: Filed Vitals:   03/31/13 2102 03/31/13 2205 04/01/13 0530 04/01/13 1401  BP: 132/66  151/79 131/48  Pulse: 75 80 65 69  Temp: 98.1 F (36.7 C)  97.9 F (36.6 C) 97.6 F (36.4 C)  TempSrc: Oral  Oral Oral  Resp:   24 16  Height:      Weight:      SpO2: 99%  100% 98%    Intake/Output Summary (Last 24 hours) at 04/01/13 1527 Last data filed at 04/01/13 1300  Gross per 24 hour  Intake 2721.25 ml  Output      0 ml  Net 2721.25 ml   Filed Weights   03/31/13 0130  Weight: 50.6 kg (111 lb 8.8 oz)    Exam:   General:  AA  Cardiovascular: RRR  Respiratory: CTA B  Abdomen: S/NT/ND/+BS  Extremities: no C/C/E   Neurologic:  Non-focal  Data Reviewed: Basic Metabolic Panel:  Recent Labs Lab 03/30/13 2230 03/31/13 1043  NA 143 141  K 4.2 3.7  CL 106 105  CO2  --  25  GLUCOSE 116* 89  BUN 28* 21  CREATININE 1.00 0.89  CALCIUM  --  8.5   Liver Function Tests: No results found for this basename: AST, ALT, ALKPHOS, BILITOT, PROT, ALBUMIN,  in the last 168 hours No results found for this basename: LIPASE, AMYLASE,  in the last 168 hours No results found for this basename: AMMONIA,  in the last 168 hours CBC:  Recent Labs Lab 03/30/13 2220   03/31/13 1043 03/31/13 1456 03/31/13 2120 04/01/13 0323 04/01/13 0930  WBC 8.8  --  5.2 5.4 5.5 6.8 6.6  NEUTROABS 5.5  --   --   --   --   --   --   HGB 10.8*  < > 7.9* 7.9* 8.3* 8.1* 8.7*  HCT 33.7*  < > 24.6* 24.7* 26.5* 26.0* 27.2*  MCV 94.9  --  94.6 95.0 95.3 94.9 96.1  PLT 239  --  163 156 178 173 178  < > = values in this interval not displayed. Cardiac Enzymes: No results found for this basename: CKTOTAL, CKMB, CKMBINDEX, TROPONINI,  in the last 168 hours BNP (last 3 results) No results found for this basename: PROBNP,  in the last 8760 hours CBG: No results found for this basename: GLUCAP,  in the last 168 hours  Recent Results (from the past 240 hour(s))  MRSA PCR SCREENING     Status: None   Collection Time    03/31/13  1:55 AM      Result Value Ref Range Status   MRSA by PCR NEGATIVE  NEGATIVE Final   Comment:            The GeneXpert MRSA Assay (FDA     approved for NASAL specimens     only), is one component of a  comprehensive MRSA colonization     surveillance program. It is not     intended to diagnose MRSA     infection nor to guide or     monitor treatment for     MRSA infections.     Studies: No results found.  Scheduled Meds: . ALIGN  4 mg Oral q morning - 10a  . cloNIDine  0.1 mg Oral q morning - 10a  . donepezil  10 mg Oral QHS  . enalapril  10 mg Oral q morning - 10a  . escitalopram  5 mg Oral q morning - 10a  . folic acid  1 mg Oral Daily  . levothyroxine  100 mcg Oral Once per day on Mon Tue Wed Thu Fri  . levothyroxine  125 mcg Oral Once per day on Sun Sat  . metoprolol  100 mg Oral BID  . multivitamin  1 tablet Oral BID  . sodium chloride  3 mL Intravenous Q12H  . vitamin B-12  1,000 mcg Oral Daily   Continuous Infusions: . sodium chloride 75 mL/hr at 04/01/13 1610    Principal Problem:   Bright red rectal bleeding Active Problems:   HYPERTENSION, BENIGN   History of GI diverticular bleed   Acute blood loss anemia    Hypothyroidism   Mild cognitive impairment   Chronic anticoagulation   Atrial fibrillation with RVR   Rectal bleed    Time spent: 25 minutes. Greater than 50% of this time was spent in direct contact with the patient coordinating care.    Chaya Jan  [facility] Pager [phone]  If 7PM-7AM, please contact night-coverage at [email or web address], password [facility] 04/01/2013, 3:27 PM  LOS: 2 days

## 2013-04-02 ENCOUNTER — Encounter: Payer: Self-pay | Admitting: Nurse Practitioner

## 2013-04-02 ENCOUNTER — Non-Acute Institutional Stay (SKILLED_NURSING_FACILITY): Payer: Medicare Other | Admitting: Nurse Practitioner

## 2013-04-02 DIAGNOSIS — I4891 Unspecified atrial fibrillation: Secondary | ICD-10-CM

## 2013-04-02 DIAGNOSIS — D62 Acute posthemorrhagic anemia: Secondary | ICD-10-CM

## 2013-04-02 DIAGNOSIS — K625 Hemorrhage of anus and rectum: Secondary | ICD-10-CM

## 2013-04-02 DIAGNOSIS — F3289 Other specified depressive episodes: Secondary | ICD-10-CM

## 2013-04-02 DIAGNOSIS — F32A Depression, unspecified: Secondary | ICD-10-CM

## 2013-04-02 DIAGNOSIS — K5900 Constipation, unspecified: Secondary | ICD-10-CM | POA: Insufficient documentation

## 2013-04-02 DIAGNOSIS — F329 Major depressive disorder, single episode, unspecified: Secondary | ICD-10-CM

## 2013-04-02 DIAGNOSIS — E039 Hypothyroidism, unspecified: Secondary | ICD-10-CM

## 2013-04-02 DIAGNOSIS — I10 Essential (primary) hypertension: Secondary | ICD-10-CM

## 2013-04-02 DIAGNOSIS — G3184 Mild cognitive impairment, so stated: Secondary | ICD-10-CM

## 2013-04-02 LAB — CBC
HCT: 26 % — ABNORMAL LOW (ref 36.0–46.0)
HEMOGLOBIN: 8.2 g/dL — AB (ref 12.0–15.0)
MCH: 30 pg (ref 26.0–34.0)
MCHC: 31.5 g/dL (ref 30.0–36.0)
MCV: 95.2 fL (ref 78.0–100.0)
Platelets: 182 10*3/uL (ref 150–400)
RBC: 2.73 MIL/uL — ABNORMAL LOW (ref 3.87–5.11)
RDW: 16.1 % — ABNORMAL HIGH (ref 11.5–15.5)
WBC: 6.3 10*3/uL (ref 4.0–10.5)

## 2013-04-02 LAB — BASIC METABOLIC PANEL
BUN: 15 mg/dL (ref 6–23)
CO2: 21 meq/L (ref 19–32)
Calcium: 8.2 mg/dL — ABNORMAL LOW (ref 8.4–10.5)
Chloride: 107 mEq/L (ref 96–112)
Creatinine, Ser: 0.84 mg/dL (ref 0.50–1.10)
GFR calc Af Amer: 67 mL/min — ABNORMAL LOW (ref >90)
GFR calc non Af Amer: 58 mL/min — ABNORMAL LOW (ref >90)
GLUCOSE: 100 mg/dL — AB (ref 70–99)
Potassium: 3.5 mEq/L — ABNORMAL LOW (ref 3.7–5.3)
Sodium: 140 mEq/L (ref 137–147)

## 2013-04-02 MED ORDER — PROSIGHT PO TABS
1.0000 | ORAL_TABLET | Freq: Two times a day (BID) | ORAL | Status: DC
Start: 2013-04-02 — End: 2013-04-04

## 2013-04-02 NOTE — Assessment & Plan Note (Addendum)
Rate controlled. Coumadin has been DC indefinitely.

## 2013-04-02 NOTE — Progress Notes (Signed)
Patient cleared for discharge. Packet copied and placed in [place]. All information [facility] to [facility]. Patient's family is here to transport patient. Patient and family thanked CSW for time and assistance.  No further CSW needs noted.  Maigen Mozingo C. Cullen Lahaie MSW, LCSW [phone]

## 2013-04-02 NOTE — Assessment & Plan Note (Addendum)
1 unit FFP last BM was Sunday 03/31/13. She was admitted one year back with similar symptoms and had colonoscopy which did not show any source of bleeding but was thought to be secondary to diverticular bleed.

## 2013-04-02 NOTE — Assessment & Plan Note (Signed)
Controlled, takes Clonidine 0.1mg (was reduced to 0.2mg  06/29/12), Enalapril 20mg  bid, Metoprolol 100mg  bid.

## 2013-04-02 NOTE — Assessment & Plan Note (Signed)
Sad facial looks, takes Lexapro 5mg                        

## 2013-04-02 NOTE — Discharge Summary (Signed)
Physician Discharge Summary  [name] ZOX:[id number] DOB: 10-11-1917 DOA: 03/30/2013  PCP: [name], MD  Admit date: 03/30/2013 Discharge date: 04/02/2013  Time spent: 45 minutes  Recommendations for Outpatient Follow-up:  -Will be discharged beck to SNF today. -No further coumadin.   Discharge Diagnoses:  Principal Problem:   Bright red rectal bleeding Active Problems:   HYPERTENSION, BENIGN   History of GI diverticular bleed   Acute blood loss anemia   Hypothyroidism   Mild cognitive impairment   Chronic anticoagulation   Atrial fibrillation with RVR   Rectal bleed   Discharge Condition: Stable and improved  Filed Weights   03/31/13 0130  Weight: 50.6 kg (111 lb 8.8 oz)    History of present illness:  80 year old female with history of A. fib on Coumadin, history of diverticular bleed, history of CHF, hypertension, mild dementia who was sent from skilled nursing facility for bright red blood per rectum on the day of admission. Patient denied any symptoms associated with it. She was admitted one year back with similar symptoms and had colonoscopy which did not show any source of bleeding but was thought to be secondary to diverticular bleed. Blood work done showed stable hemoglobin and hematocrit. INR was elevated to 3.06.  Patient denies headache, dizziness, fever, chills, nausea , vomiting, chest pain, palpitations, SOB, abdominal pain, bowel or urinary symptoms. Denies change in weight or appetite. At baseline she is wheelchair bound.  Course in the ED  Patient was in A. fib with RVR with heart rate ranging from 90-160. Blood work done showed hemoglobin of 11.2 and hematocrit of 33. Chemistry was normal except for mildly elevated BUN of 28. INR was elevated to 3.06. Patient ordered for 1 unit FFP by ED physician. She was given 10 mg IV Cardizem post op for which pocket remained stable between 70-90.  [facility] called for admission to  telemetry.   Hospital Course:   Hematochezia  -Known to have prior diverticular bleeds.  -Holding Coumadin indefinitely.  -No needs for transfusions   ABLA  -Hb stable to slightly increased.  -Has not received any PRBCs transfusion this admission.  -Hb 8.7.   Atrial Fibrillation  -Rate controlled.  -Coumadin has been DC indefinitely.   Procedures:  None   Consultations:  None  Discharge Instructions  Discharge Orders   Future Appointments Provider Department Dept Phone   05/23/2013 9:55 AM [facility] [name] Office [phone]   Future Orders Complete By Expires   Discontinue IV  As directed    Increase activity slowly  As directed        Medication List    STOP taking these medications       warfarin 1 MG tablet  Commonly known as:  COUMADIN      TAKE these medications       acetaminophen 500 MG tablet  Commonly known as:  TYLENOL  Take 500 mg by mouth every evening.     ALIGN 4 MG Caps  Take 4 mg by mouth every morning.     ARICEPT 10 MG tablet  Generic drug:  donepezil  Take 10 mg by mouth at bedtime.     cloNIDine 0.2 MG tablet  Commonly known as:  CATAPRES  Take 0.1 mg by mouth every morning.     enalapril 10 MG tablet  Commonly known as:  VASOTEC  Take 10 mg by mouth every morning.     escitalopram 5 MG tablet  Commonly  known as:  LEXAPRO  Take 5 mg by mouth every morning.     folic acid 1 MG tablet  Commonly known as:  FOLVITE  Take 1 mg by mouth daily.     levothyroxine 100 MCG tablet  Commonly known as:  SYNTHROID, LEVOTHROID  Take 100 mcg by mouth See admin instructions. Take 1 tablet daily Monday- Friday     levothyroxine 125 MCG tablet  Commonly known as:  SYNTHROID, LEVOTHROID  Take 125 mcg by mouth See admin instructions. Take 1 tablet Saturday and Sunday     metoprolol 100 MG tablet  Commonly known as:  LOPRESSOR  Take 100 mg by mouth 2 (two) times daily.     PRESERVISION/LUTEIN Caps   Take 1 capsule by mouth 2 (two) times daily.     multivitamin Tabs tablet  Take 1 tablet by mouth 2 (two) times daily.     vitamin B-12 1000 MCG tablet  Commonly known as:  CYANOCOBALAMIN  Take 1,000 mcg by mouth daily.       Allergies  Allergen Reactions  . Asacol [Mesalamine] Other (See Comments)    Unknown reaction  . Aspirin Other (See Comments)    Per mar  . Butalbital-Asa-Caff-Codeine Other (See Comments)    Unknown reaction  . Codeine Other (See Comments)    Per mar      The results of significant diagnostics from this hospitalization (including imaging, microbiology, ancillary and laboratory) are listed below for reference.    Significant Diagnostic Studies: No results found.  Microbiology: Recent Results (from the past 240 hour(s))  MRSA PCR SCREENING     Status: None   Collection Time    03/31/13  1:55 AM      Result Value Ref Range Status   MRSA by PCR NEGATIVE  NEGATIVE Final   Comment:            The GeneXpert MRSA Assay (FDA     approved for NASAL specimens     only), is one component of a     comprehensive MRSA colonization     surveillance program. It is not     intended to diagnose MRSA     infection nor to guide or     monitor treatment for     MRSA infections.     Labs: Basic Metabolic Panel:  Recent Labs Lab 03/30/13 2230 03/31/13 1043 04/02/13 0545  NA 143 141 140  K 4.2 3.7 3.5*  CL 106 105 107  CO2  --  25 21  GLUCOSE 116* 89 100*  BUN 28* 21 15  CREATININE 1.00 0.89 0.84  CALCIUM  --  8.5 8.2*   Liver Function Tests: No results found for this basename: AST, ALT, ALKPHOS, BILITOT, PROT, ALBUMIN,  in the last 168 hours No results found for this basename: LIPASE, AMYLASE,  in the last 168 hours No results found for this basename: AMMONIA,  in the last 168 hours CBC:  Recent Labs Lab 03/30/13 2220  03/31/13 1456 03/31/13 2120 04/01/13 0323 04/01/13 0930 04/02/13 0545  WBC 8.8  < > 5.4 5.5 6.8 6.6 6.3  NEUTROABS 5.5   --   --   --   --   --   --   HGB 10.8*  < > 7.9* 8.3* 8.1* 8.7* 8.2*  HCT 33.7*  < > 24.7* 26.5* 26.0* 27.2* 26.0*  MCV 94.9  < > 95.0 95.3 94.9 96.1 95.2  PLT 239  < > 156 178 173 178  182  < > = values in this interval not displayed. Cardiac Enzymes: No results found for this basename: CKTOTAL, CKMB, CKMBINDEX, TROPONINI,  in the last 168 hours BNP: BNP (last 3 results) No results found for this basename: PROBNP,  in the last 8760 hours CBG: No results found for this basename: GLUCAP,  in the last 168 hours     Signed:  Chaya Jan  [facility] Pager: [phone] 04/02/2013, 9:52 AM

## 2013-04-02 NOTE — Assessment & Plan Note (Signed)
Resides at SNF, takes Aricept, progressed rapidly in the past year.

## 2013-04-02 NOTE — Assessment & Plan Note (Addendum)
2nd diverticular bleed-off Coumadin-update CBC. Fe 325mg  bid. Hgb 8.2 upon hospital discharge. Has not received any PRBCs transfusion this hospital stay

## 2013-04-02 NOTE — Assessment & Plan Note (Signed)
Levothyroxine 100mcg M-F, 125mcg Sat + Sun-TSH 2.68 12/24/12

## 2013-04-02 NOTE — Progress Notes (Signed)
Patient ID: [name], female   DOB: April 24, 1917, 81 y.o.   MRN: [id number]   Code Status: DNR  Allergies  Allergen Reactions  . Asacol [Mesalamine] Other (See Comments)    Unknown reaction  . Aspirin Other (See Comments)    Per mar  . Butalbital-Asa-Caff-Codeine Other (See Comments)    Unknown reaction  . Codeine Other (See Comments)    Per mar    Chief Complaint  Patient presents with  . Medical Managment of Chronic Issues    gi bleed, ABLA, A-fib, constipation.   Marland Kitchen Hospitalization Follow-up    HPI: Patient is a 80 y.o. female seen in the SNF at [facility] today for evaluation of s/p hospitalization for diverticular bleed and other chronic medical conditions.   Hospitalized from 03/30/2013 to 04/02/2013 for rectal bleed, last BM was 03/31/13. Had 1 unit of FFP in ED. No further coumadin.    Problem List Items Addressed This Visit   Unspecified constipation     Last BM 3 days ago-adding MiraLax qod-observe the patient.     Rectal bleed     1 unit FFP last BM was Sunday 03/31/13. She was admitted one year back with similar symptoms and had colonoscopy which did not show any source of bleeding but was thought to be secondary to diverticular bleed.      Mild cognitive impairment     Resides at SNF, takes Aricept, progressed rapidly in the past year.       Hypothyroidism     Levothyroxine M-F, Sat + Sun-TSH 2.68 12/24/12      HYPERTENSION, BENIGN     Controlled, takes Clonidine 0.1mg (was reduced to 0.2mg  06/29/12), Enalapril 20mg  bid, Metoprolol 100mg  bid.       Depression     Sad facial looks, takes Lexapro 5mg      Atrial fibrillation with RVR     Rate controlled. Coumadin has been DC indefinitely.       Acute blood loss anemia - Primary     2nd diverticular bleed-off Coumadin-update CBC. Fe 325mg  bid. Hgb 8.2 upon hospital discharge. Has not received any PRBCs transfusion this hospital stay       Relevant Medications      ferrous  sulfate 325 (65 FE) MG tablet      Review of Systems:  Review of Systems  Constitutional: Negative for fever, chills, weight loss, malaise/fatigue and diaphoresis.  HENT: Positive for ear pain and hearing loss. Negative for congestion, ear discharge and sore throat.        Left   Eyes: Negative for pain, discharge and redness.  Respiratory: Negative for cough, sputum production and wheezing.   Cardiovascular: Positive for PND. Negative for chest pain, palpitations, orthopnea and claudication. Leg swelling: ankle/feet edeam, L>R.  Gastrointestinal: Negative for heartburn, nausea, vomiting, abdominal pain, diarrhea, constipation and blood in stool.       Pill dysphagia  Genitourinary: Positive for frequency (incontinent of bladder). Negative for dysuria, urgency, hematuria and flank pain.  Musculoskeletal: Positive for joint pain. Negative for back pain, falls, myalgias and neck pain.  Skin: Negative for itching and rash.       Chronic BLE venous insufficiency. Purplish BLE when in dependent position with several keratotic plaques anterior ly.    Neurological: Negative for dizziness, tingling, tremors, sensory change, speech change, focal weakness, seizures, loss of consciousness and weakness.  Endo/Heme/Allergies: Negative for environmental allergies and polydipsia. Bruises/bleeds easily.  Psychiatric/Behavioral: Positive for depression and memory loss. Negative  for suicidal ideas, hallucinations and substance abuse. The patient is not nervous/anxious and does not have insomnia.      Past Medical History  Diagnosis Date  . Hypertension   . Hypothyroidism   . Osteoarthritis   . Osteoporosis   . Diverticulosis   . Chronic diarrhea   . Macular degeneration   . Kyphosis     of thoracic spine  . History of GI diverticular bleed Sept 3, 2003  . Toxic goiter     hx w subsequent ablation and hypothyroid state  . Mild cognitive disorder   . Bronchiectasis     left lower lobe noted on  x-ray 2/05  . Hiatal hernia     hx w repair  . Urinary incontinence   . Urinary tract bacterial infections   . Atrial fibrillation Sept 12, 2005    new onset  . Right renal atrophy 2005  . Pacemaker   . H/O Clostridium difficile infection   . Macular degeneration   . Diverticulosis    Past Surgical History  Procedure Laterality Date  . Repair of hiatal hernia and tube, gastrostomy.  08/06/1999  . Total knee arthroplasty  10/13/2003    Right  . Laparoscopic repair of ventral incisional hernia with  04/21/2000  . Colonoscopy  02/09/2012    Procedure: COLONOSCOPY;  Surgeon: Rachael Fee, MD;  Location: [facility];  Service: Endoscopy;  Laterality: N/A;   Social History:   reports that she has never smoked. She has never used smokeless tobacco. She reports that she does not drink alcohol or use illicit drugs.  Family History  Problem Relation Age of Onset  . Aneurysm Father   . Cancer Daughter     Breast and lung cancer  . Hypertension Son   . Hyperlipidemia Son     Medications: Patient's Medications  New Prescriptions   No medications on file  Previous Medications   ACETAMINOPHEN (TYLENOL) 500 MG TABLET    Take 500 mg by mouth every evening.    CLONIDINE (CATAPRES) 0.2 MG TABLET    Take 0.1 mg by mouth every morning.    DONEPEZIL (ARICEPT) 10 MG TABLET    Take 10 mg by mouth at bedtime.    ENALAPRIL (VASOTEC) 10 MG TABLET    Take 10 mg by mouth every morning.    ESCITALOPRAM (LEXAPRO) 5 MG TABLET    Take 5 mg by mouth every morning.    FERROUS SULFATE 325 (65 FE) MG TABLET    Take 325 mg by mouth 2 (two) times daily with a meal.   FOLIC ACID (FOLVITE) 1 MG TABLET    Take 1 mg by mouth daily.   LEVOTHYROXINE (SYNTHROID, LEVOTHROID) 100 MCG TABLET    Take 100 mcg by mouth See admin instructions. Take 1 tablet daily Monday- Friday   LEVOTHYROXINE (SYNTHROID, LEVOTHROID) 125 MCG TABLET    Take 125 mcg by mouth See admin instructions. Take 1 tablet Saturday and Sunday    METOPROLOL (LOPRESSOR) 100 MG TABLET    Take 100 mg by mouth 2 (two) times daily.   MULTIPLE VITAMINS-MINERALS (PRESERVISION/LUTEIN) CAPS    Take 1 capsule by mouth 2 (two) times daily.    MULTIVITAMIN (PROSIGHT) TABS TABLET    Take 1 tablet by mouth 2 (two) times daily.   POLYETHYLENE GLYCOL (MIRALAX / GLYCOLAX) PACKET    Take 17 g by mouth every other day.   PROBIOTIC PRODUCT (ALIGN) 4 MG CAPS    Take 4 mg by mouth  every morning.    VITAMIN B-12 (CYANOCOBALAMIN) 1000 MCG TABLET    Take 1,000 mcg by mouth daily.  Modified Medications   No medications on file  Discontinued Medications   No medications on file     Physical Exam: Physical Exam  Constitutional: She is oriented to person, place, and time. She appears well-developed and well-nourished. No distress.  HENT:  Head: Normocephalic and atraumatic.  Right Ear: Tympanic membrane and external ear normal. No lacerations. No drainage, swelling or tenderness. No foreign bodies. No mastoid tenderness. Tympanic membrane is not injected, not scarred, not perforated, not erythematous, not retracted and not bulging. Tympanic membrane mobility is normal. No middle ear effusion. No hemotympanum. Decreased hearing is noted.  Left Ear: Tympanic membrane and external ear normal. No lacerations. No drainage, swelling or tenderness. No foreign bodies. No mastoid tenderness. Tympanic membrane is not injected, not scarred, not perforated, not erythematous, not retracted and not bulging. Tympanic membrane mobility is normal.  No middle ear effusion. No hemotympanum. No decreased hearing is noted.  Eyes: Conjunctivae and EOM are normal. Pupils are equal, round, and reactive to light.  Neck: Normal range of motion. Neck supple. No JVD present. No thyromegaly present.  Cardiovascular: Normal rate.  An irregular rhythm present.  Murmur heard.  Systolic murmur is present with a grade of 2/6  Left upper chest pacemaker.   Pulmonary/Chest: Effort normal. She has  no wheezes. She has rales (bibasilar dry rales). She exhibits no tenderness.  Dry rales bibasilar.   Abdominal: Soft. Bowel sounds are normal. There is no tenderness.  Musculoskeletal: Normal range of motion. She exhibits edema (BLE. L>R left ankle. ).  Trace to 1+ edema seen in BLE  Lymphadenopathy:    She has no cervical adenopathy.  Neurological: She is alert and oriented to person, place, and time. She has normal reflexes. No cranial nerve deficit. She exhibits normal muscle tone. Coordination normal.  Skin: Skin is warm and dry. No rash noted. She is not diaphoretic. Erythema: bilateral lower leg pigmentation.  Chronic BLE venous insufficiency. Purplish BLE when in dependent position with several keratotic plaques anterior ly.   Inflamed oral cavity linings.    Psychiatric: Her speech is normal. Her affect is not angry, not blunt and not inappropriate. She is not aggressive. Thought content is not paranoid and not delusional. Cognition and memory are impaired. She expresses impulsivity. She does not express inappropriate judgment. She exhibits a depressed mood. She exhibits abnormal recent memory.    Filed Vitals:   04/02/13 1704  BP: 112/78  Pulse: 76  Temp: 98.1 F (36.7 C)  TempSrc: Tympanic  Resp: 16      Labs reviewed: Basic Metabolic Panel:  Recent Labs  16/10/9600/28/15 2230 03/31/13 1043 04/02/13 0545  NA 143 141 140  K 4.2 3.7 3.5*  CL 106 105 107  CO2  --  25 21  GLUCOSE 116* 89 100*  BUN 28* 21 15  CREATININE 1.00 0.89 0.84  CALCIUM  --  8.5 8.2*  TSH  --  10.023*  --    Liver Function Tests:  Recent Labs  01/14/13  AST 20  ALT 9  ALKPHOS 72   CBC:  Recent Labs  03/30/13 2220  04/01/13 0323 04/01/13 0930 04/02/13 0545  WBC 8.8  < > 6.8 6.6 6.3  NEUTROABS 5.5  --   --   --   --   HGB 10.8*  < > 8.1* 8.7* 8.2*  HCT 33.7*  < > 26.0*  27.2* 26.0*  MCV 94.9  < > 94.9 96.1 95.2  PLT 239  < > 173 178 182  < > = values in this interval not  displayed. Past Procedures:  01/09/13 CXR borderline cardiomegaly unchanged without pulmonary vascular congestion or pleural effusion, old granulomatous disease unchanged, mild interstitial findings area seen at both lower lungs unchanged and likely chronic, no inflammatory consolidate or suspicious nodule.     Assessment/Plan Acute blood loss anemia 2nd diverticular bleed-off Coumadin-update CBC. Fe 325mg  bid. Hgb 8.2 upon hospital discharge. Has not received any PRBCs transfusion this hospital stay     Unspecified constipation Last BM 3 days ago-adding MiraLax qod-observe the patient.   Mild cognitive impairment Resides at SNF, takes Aricept, progressed rapidly in the past year.     Hypothyroidism Levothyroxine M-F, Sat + Sun-TSH 2.68 12/24/12    HYPERTENSION, BENIGN Controlled, takes Clonidine 0.1mg (was reduced to 0.2mg  06/29/12), Enalapril 20mg  bid, Metoprolol 100mg  bid.     Atrial fibrillation with RVR Rate controlled. Coumadin has been DC indefinitely.     Depression Sad facial looks, takes Lexapro 5mg    Rectal bleed 1 unit FFP last BM was Sunday 03/31/13. She was admitted one year back with similar symptoms and had colonoscopy which did not show any source of bleeding but was thought to be secondary to diverticular bleed.      Family/ Staff Communication: observe the patient.   Goals of Care: SNF  Labs/tests ordered: CBC and CMP Thr.

## 2013-04-02 NOTE — Assessment & Plan Note (Signed)
Last BM 3 days ago-adding MiraLax qod-observe the patient.

## 2013-04-03 LAB — TYPE AND SCREEN
ABO/RH(D): O POS
ANTIBODY SCREEN: NEGATIVE
UNIT DIVISION: 0
Unit division: 0

## 2013-04-04 ENCOUNTER — Observation Stay (HOSPITAL_COMMUNITY)
Admission: EM | Admit: 2013-04-04 | Discharge: 2013-04-05 | Disposition: A | Discharge status: Skilled Nursing Facility | Payer: Medicare Other | Attending: Internal Medicine | Admitting: Internal Medicine

## 2013-04-04 ENCOUNTER — Encounter (HOSPITAL_COMMUNITY): Payer: Self-pay | Admitting: Emergency Medicine

## 2013-04-04 DIAGNOSIS — Z8719 Personal history of other diseases of the digestive system: Secondary | ICD-10-CM

## 2013-04-04 DIAGNOSIS — I10 Essential (primary) hypertension: Secondary | ICD-10-CM

## 2013-04-04 DIAGNOSIS — D62 Acute posthemorrhagic anemia: Secondary | ICD-10-CM

## 2013-04-04 DIAGNOSIS — Z950 Presence of cardiac pacemaker: Secondary | ICD-10-CM

## 2013-04-04 DIAGNOSIS — K625 Hemorrhage of anus and rectum: Secondary | ICD-10-CM

## 2013-04-04 DIAGNOSIS — R609 Edema, unspecified: Secondary | ICD-10-CM

## 2013-04-04 DIAGNOSIS — I498 Other specified cardiac arrhythmias: Secondary | ICD-10-CM

## 2013-04-04 DIAGNOSIS — F32A Depression, unspecified: Secondary | ICD-10-CM

## 2013-04-04 DIAGNOSIS — K921 Melena: Secondary | ICD-10-CM

## 2013-04-04 DIAGNOSIS — K5730 Diverticulosis of large intestine without perforation or abscess without bleeding: Secondary | ICD-10-CM

## 2013-04-04 DIAGNOSIS — R1310 Dysphagia, unspecified: Secondary | ICD-10-CM

## 2013-04-04 DIAGNOSIS — E039 Hypothyroidism, unspecified: Secondary | ICD-10-CM

## 2013-04-04 DIAGNOSIS — M810 Age-related osteoporosis without current pathological fracture: Secondary | ICD-10-CM | POA: Insufficient documentation

## 2013-04-04 DIAGNOSIS — F329 Major depressive disorder, single episode, unspecified: Secondary | ICD-10-CM

## 2013-04-04 DIAGNOSIS — R55 Syncope and collapse: Secondary | ICD-10-CM

## 2013-04-04 DIAGNOSIS — H3530 Unspecified macular degeneration: Secondary | ICD-10-CM | POA: Insufficient documentation

## 2013-04-04 DIAGNOSIS — K5731 Diverticulosis of large intestine without perforation or abscess with bleeding: Principal | ICD-10-CM | POA: Insufficient documentation

## 2013-04-04 DIAGNOSIS — G3184 Mild cognitive impairment, so stated: Secondary | ICD-10-CM

## 2013-04-04 DIAGNOSIS — Z79899 Other long term (current) drug therapy: Secondary | ICD-10-CM | POA: Insufficient documentation

## 2013-04-04 DIAGNOSIS — Z96659 Presence of unspecified artificial knee joint: Secondary | ICD-10-CM | POA: Insufficient documentation

## 2013-04-04 DIAGNOSIS — K5900 Constipation, unspecified: Secondary | ICD-10-CM

## 2013-04-04 DIAGNOSIS — I509 Heart failure, unspecified: Secondary | ICD-10-CM

## 2013-04-04 DIAGNOSIS — I4891 Unspecified atrial fibrillation: Secondary | ICD-10-CM

## 2013-04-04 DIAGNOSIS — K922 Gastrointestinal hemorrhage, unspecified: Secondary | ICD-10-CM

## 2013-04-04 DIAGNOSIS — F0390 Unspecified dementia without behavioral disturbance: Secondary | ICD-10-CM

## 2013-04-04 DIAGNOSIS — R634 Abnormal weight loss: Secondary | ICD-10-CM

## 2013-04-04 DIAGNOSIS — Z7901 Long term (current) use of anticoagulants: Secondary | ICD-10-CM

## 2013-04-04 LAB — COMPREHENSIVE METABOLIC PANEL
ALBUMIN: 2.4 g/dL — AB (ref 3.5–5.2)
ALK PHOS: 79 U/L (ref 39–117)
ALT: 5 U/L (ref 0–35)
AST: 14 U/L (ref 0–37)
BILIRUBIN TOTAL: 0.3 mg/dL (ref 0.3–1.2)
BUN: 20 mg/dL (ref 6–23)
CHLORIDE: 104 meq/L (ref 96–112)
CO2: 23 mEq/L (ref 19–32)
Calcium: 8.3 mg/dL — ABNORMAL LOW (ref 8.4–10.5)
Creatinine, Ser: 0.94 mg/dL (ref 0.50–1.10)
GFR calc Af Amer: 59 mL/min — ABNORMAL LOW (ref >90)
GFR, EST NON AFRICAN AMERICAN: 51 mL/min — AB (ref >90)
Glucose, Bld: 101 mg/dL — ABNORMAL HIGH (ref 70–99)
Potassium: 4 mEq/L (ref 3.7–5.3)
Sodium: 139 mEq/L (ref 137–147)
Total Protein: 6 g/dL (ref 6.0–8.3)

## 2013-04-04 LAB — CBC WITH DIFFERENTIAL/PLATELET
BASOS PCT: 0 % (ref 0–1)
Basophils Absolute: 0 10*3/uL (ref 0.0–0.1)
EOS ABS: 0.5 10*3/uL (ref 0.0–0.7)
Eosinophils Relative: 8 % — ABNORMAL HIGH (ref 0–5)
HCT: 25.1 % — ABNORMAL LOW (ref 36.0–46.0)
HEMOGLOBIN: 8.1 g/dL — AB (ref 12.0–15.0)
LYMPHS PCT: 19 % (ref 12–46)
Lymphs Abs: 1.1 10*3/uL (ref 0.7–4.0)
MCH: 30.7 pg (ref 26.0–34.0)
MCHC: 32.3 g/dL (ref 30.0–36.0)
MCV: 95.1 fL (ref 78.0–100.0)
Monocytes Absolute: 0.6 10*3/uL (ref 0.1–1.0)
Monocytes Relative: 10 % (ref 3–12)
NEUTROS PCT: 63 % (ref 43–77)
Neutro Abs: 3.5 10*3/uL (ref 1.7–7.7)
PLATELETS: 191 10*3/uL (ref 150–400)
RBC: 2.64 MIL/uL — ABNORMAL LOW (ref 3.87–5.11)
RDW: 16.2 % — ABNORMAL HIGH (ref 11.5–15.5)
WBC: 5.7 10*3/uL (ref 4.0–10.5)

## 2013-04-04 LAB — PROTIME-INR
INR: 1.29 (ref 0.00–1.49)
PROTHROMBIN TIME: 15.8 s — AB (ref 11.6–15.2)

## 2013-04-04 LAB — APTT: aPTT: 35 seconds (ref 24–37)

## 2013-04-04 LAB — SAMPLE TO BLOOD BANK

## 2013-04-04 LAB — POC OCCULT BLOOD, ED: Fecal Occult Bld: POSITIVE — AB

## 2013-04-04 MED ORDER — ESCITALOPRAM OXALATE 5 MG PO TABS
5.0000 mg | ORAL_TABLET | Freq: Every morning | ORAL | Status: DC
  Administered 2013-04-05: 5 mg via ORAL
  Filled 2013-04-04: qty 1

## 2013-04-04 MED ORDER — LEVOTHYROXINE SODIUM 125 MCG PO TABS
125.0000 ug | ORAL_TABLET | ORAL | Status: DC
  Filled 2013-04-04: qty 1

## 2013-04-04 MED ORDER — ONDANSETRON HCL 4 MG/2ML IJ SOLN: 4.0000 mg | Freq: Four times a day (QID) | INTRAMUSCULAR | Status: DC | PRN

## 2013-04-04 MED ORDER — BOOST PO LIQD
237.0000 mL | ORAL | Status: DC | PRN
  Filled 2013-04-04: qty 237

## 2013-04-04 MED ORDER — LEVOTHYROXINE SODIUM 100 MCG PO TABS
100.0000 ug | ORAL_TABLET | ORAL | Status: DC
  Administered 2013-04-05: 100 ug via ORAL
  Filled 2013-04-04: qty 1

## 2013-04-04 MED ORDER — ONDANSETRON HCL 4 MG PO TABS: 4.0000 mg | ORAL_TABLET | Freq: Four times a day (QID) | ORAL | Status: DC | PRN

## 2013-04-04 MED ORDER — ACETAMINOPHEN 500 MG PO TABS
500.0000 mg | ORAL_TABLET | Freq: Every evening | ORAL | Status: DC
  Administered 2013-04-04: 500 mg via ORAL
  Filled 2013-04-04 (×2): qty 1

## 2013-04-04 MED ORDER — DONEPEZIL HCL 10 MG PO TABS
10.0000 mg | ORAL_TABLET | Freq: Every day | ORAL | Status: DC
  Administered 2013-04-04: 10 mg via ORAL
  Filled 2013-04-04 (×2): qty 1

## 2013-04-04 MED ORDER — ENALAPRIL MALEATE 10 MG PO TABS
10.0000 mg | ORAL_TABLET | Freq: Every morning | ORAL | Status: DC
  Administered 2013-04-05: 10 mg via ORAL
  Filled 2013-04-04: qty 1

## 2013-04-04 MED ORDER — METOPROLOL TARTRATE 1 MG/ML IV SOLN
5.0000 mg | Freq: Once | INTRAVENOUS | Status: AC
  Administered 2013-04-04: 5 mg via INTRAVENOUS
  Filled 2013-04-04: qty 5

## 2013-04-04 MED ORDER — VITAMIN B-12 1000 MCG PO TABS
1000.0000 ug | ORAL_TABLET | Freq: Every day | ORAL | Status: DC
  Administered 2013-04-05: 1000 ug via ORAL
  Filled 2013-04-04: qty 1

## 2013-04-04 MED ORDER — FOLIC ACID 1 MG PO TABS
1.0000 mg | ORAL_TABLET | Freq: Every day | ORAL | Status: DC
  Administered 2013-04-05: 1 mg via ORAL
  Filled 2013-04-04: qty 1

## 2013-04-04 MED ORDER — HYDRALAZINE HCL 20 MG/ML IJ SOLN
10.0000 mg | Freq: Three times a day (TID) | INTRAMUSCULAR | Status: DC | PRN
  Filled 2013-04-04: qty 0.5

## 2013-04-04 MED ORDER — SODIUM CHLORIDE 0.9 % IJ SOLN
3.0000 mL | Freq: Two times a day (BID) | INTRAMUSCULAR | Status: DC
  Administered 2013-04-04 – 2013-04-05 (×2): 3 mL via INTRAVENOUS

## 2013-04-04 MED ORDER — ADULT MULTIVITAMIN W/MINERALS CH
1.0000 | ORAL_TABLET | Freq: Two times a day (BID) | ORAL | Status: DC
  Administered 2013-04-04 – 2013-04-05 (×2): 1 via ORAL
  Filled 2013-04-04 (×3): qty 1

## 2013-04-04 MED ORDER — METOPROLOL TARTRATE 1 MG/ML IV SOLN
5.0000 mg | Freq: Four times a day (QID) | INTRAVENOUS | Status: DC | PRN
  Filled 2013-04-04: qty 5

## 2013-04-04 MED ORDER — METOPROLOL TARTRATE 100 MG PO TABS
100.0000 mg | ORAL_TABLET | Freq: Two times a day (BID) | ORAL | Status: DC
  Administered 2013-04-04 – 2013-04-05 (×2): 100 mg via ORAL
  Filled 2013-04-04 (×3): qty 1

## 2013-04-04 MED ORDER — FERROUS SULFATE 325 (65 FE) MG PO TABS
325.0000 mg | ORAL_TABLET | Freq: Two times a day (BID) | ORAL | Status: DC
  Administered 2013-04-05: 325 mg via ORAL
  Filled 2013-04-04 (×3): qty 1

## 2013-04-04 NOTE — H&P (Addendum)
[facility] History and Physical  [name] ZOX:[id number] DOB: [birth year] DOA: 04/04/2013  Referring physician:  PCP: [name], MD  Specialists:   Chief Complaint: GIB  HPI: [name] is a 80 y.o. female with PMH of dementia, HTN, A FIB, who recently treated for diverticular bleeding while on coumadin discharged to SNF on 3/3; -she had few episodes of melena yesterday, and referred for ED evaluation;  patient denies nausea, vomiting, no diarrhea, no abdominal pain, no new episodes of melena today; SOB, no chest pain;   Review of Systems: The patient denies anorexia, fever, weight loss,, vision loss, decreased hearing, hoarseness, chest pain, syncope, dyspnea on exertion, peripheral edema, balance deficits, hemoptysis, abdominal pain, severe indigestion/heartburn, hematuria, incontinence, genital sores, muscle weakness, suspicious skin lesions, transient blindness, difficulty walking, depression, unusual weight change, abnormal bleeding, enlarged lymph nodes, angioedema, and breast masses.   Past Medical History  Diagnosis Date  . Hypertension   . Hypothyroidism   . Osteoarthritis   . Osteoporosis   . Diverticulosis   . Chronic diarrhea   . Macular degeneration   . Kyphosis     of thoracic spine  . History of GI diverticular bleed Sept 3, 2003  . Toxic goiter     hx w subsequent ablation and hypothyroid state  . Mild cognitive disorder   . Bronchiectasis     left lower lobe noted on x-ray 2/05  . Hiatal hernia     hx w repair  . Urinary incontinence   . Urinary tract bacterial infections   . Atrial fibrillation Sept 12, 2005    new onset  . Right renal atrophy 2005  . Pacemaker   . H/O Clostridium difficile infection   . Macular degeneration   . Diverticulosis    Past Surgical History  Procedure Laterality Date  . Repair of hiatal hernia and tube, gastrostomy.  08/06/1999  . Total knee arthroplasty  10/13/2003    Right  . Laparoscopic repair  of ventral incisional hernia with  04/21/2000  . Colonoscopy  02/09/2012    Procedure: COLONOSCOPY;  Surgeon: Rachael Fee, MD;  Location: [facility];  Service: Endoscopy;  Laterality: N/A;   Social History:  reports that she has never smoked. She has never used smokeless tobacco. She reports that she does not drink alcohol or use illicit drugs. SNF,  where does patient live--home, ALF, SNF? and with whom if at home? No;  Can patient participate in ADLs?  Allergies  Allergen Reactions  . Asacol [Mesalamine] Other (See Comments)    Unknown reaction  . Aspirin Other (See Comments)    Per mar  . Butalbital-Asa-Caff-Codeine Other (See Comments)    Unknown reaction  . Codeine Other (See Comments)    Per mar    Family History  Problem Relation Age of Onset  . Aneurysm Father   . Cancer Daughter     Breast and lung cancer  . Hypertension Son   . Hyperlipidemia Son     (be sure to complete)  Prior to Admission medications   Medication Sig Start Date End Date Taking? Authorizing Provider  acetaminophen (TYLENOL) 500 MG tablet Take 500 mg by mouth every evening.    Yes Historical Provider, MD  donepezil (ARICEPT) 10 MG tablet Take 10 mg by mouth at bedtime.    Yes Historical Provider, MD  enalapril (VASOTEC) 10 MG tablet Take 10 mg by mouth every morning.  11/20/12  Yes Brooke O Edmisten, PA-C  escitalopram (LEXAPRO) 5  MG tablet Take 5 mg by mouth every morning.    Yes Historical Provider, MD  ferrous sulfate 325 (65 FE) MG tablet Take 325 mg by mouth 2 (two) times daily with a meal.   Yes Historical Provider, MD  folic acid (FOLVITE) 1 MG tablet Take 1 mg by mouth daily.   Yes Historical Provider, MD  lactose free nutrition (BOOST) LIQD Take 237 mLs by mouth as needed (as desired/ needed per patient).   Yes Historical Provider, MD  levothyroxine (SYNTHROID, LEVOTHROID) 100 MCG tablet Take 100-125 mcg by mouth See admin instructions. Take 100mcg tablet daily Monday- Friday, takes 125mcg  on Sat. And Sunday.   Yes Historical Provider, MD  metoprolol (LOPRESSOR) 100 MG tablet Take 100 mg by mouth 2 (two) times daily.   Yes Historical Provider, MD  Multiple Vitamin (MULTIVITAMIN WITH MINERALS) TABS tablet Take 1 tablet by mouth 2 (two) times daily.   Yes Historical Provider, MD  Multiple Vitamins-Minerals (PRESERVISION/LUTEIN) CAPS Take 1 capsule by mouth 2 (two) times daily.    Yes Historical Provider, MD  polyethylene glycol (MIRALAX / GLYCOLAX) packet Take 17 g by mouth daily.    Yes Historical Provider, MD  Probiotic Product (ALIGN) 4 MG CAPS Take 4 mg by mouth every morning.    Yes Historical Provider, MD  tuberculin 5 UNIT/0.1ML injection Inject 0.1 mLs into the skin once. Scheduled to give again on 04/20/13   Yes Historical Provider, MD  vitamin B-12 (CYANOCOBALAMIN) 1000 MCG tablet Take 1,000 mcg by mouth daily.   Yes Historical Provider, MD   Physical Exam: Filed Vitals:   04/04/13 1700  BP: 163/92  Pulse: 119  Temp:   Resp: 25     General:  Alert, confused  Eyes: EOM-I  ENT: no oral ulcers   Neck: supple   Cardiovascular: s1,s2 irregular   Respiratory: CTA BL  Abdomen: soft, nt, nd   Skin: ecchymosis   Musculoskeletal: mild edema   Psychiatric: no hallucinations   Neurologic: CN 2-12 intact, confused, motor 3/5 BL LE; per family chronic patient is not ambulatory   Labs on Admission:  Basic Metabolic Panel:  Recent Labs Lab 03/30/13 03/30/13 2230 03/31/13 1043 04/02/13 0545 04/04/13 1523  NA 139 143 141 140 139  K 4.3 4.2 3.7 3.5* 4.0  CL  --  106 105 107 104  CO2  --   --  25 21 23   GLUCOSE  --  116* 89 100* 101*  BUN 23* 28* 21 15 20   CREATININE 0.9 1.00 0.89 0.84 0.94  CALCIUM  --   --  8.5 8.2* 8.3*   Liver Function Tests:  Recent Labs Lab 04/04/13 1523  AST 14  ALT 5  ALKPHOS 79  BILITOT 0.3  PROT 6.0  ALBUMIN 2.4*   No results found for this basename: LIPASE, AMYLASE,  in the last 168 hours No results found for this  basename: AMMONIA,  in the last 168 hours CBC:  Recent Labs Lab 03/30/13 2220  03/31/13 2120 04/01/13 0323 04/01/13 0930 04/02/13 0545 04/04/13 1523  WBC 8.8  < > 5.5 6.8 6.6 6.3 5.7  NEUTROABS 5.5  --   --   --   --   --  3.5  HGB 10.8*  < > 8.3* 8.1* 8.7* 8.2* 8.1*  HCT 33.7*  < > 26.5* 26.0* 27.2* 26.0* 25.1*  MCV 94.9  < > 95.3 94.9 96.1 95.2 95.1  PLT 239  < > 178 173 178 182 191  < > =  values in this interval not displayed. Cardiac Enzymes: No results found for this basename: CKTOTAL, CKMB, CKMBINDEX, TROPONINI,  in the last 168 hours  BNP (last 3 results) No results found for this basename: PROBNP,  in the last 8760 hours CBG: No results found for this basename: GLUCAP,  in the last 168 hours  Radiological Exams on Admission: No results found.  EKG: Independently reviewed. A fib   Assessment/Plan Principal Problem:   Melena Active Problems:   Atrial fibrillation   Rectal bleeding   Acute blood loss anemia   Dementia   80 y.o. female with PMH of dementia, HTN, A FIB, who recently treated for diverticular bleeding while on coumadin discharged to SNF on 3/3 presented with melena   1. LGIB, recent diverticular bleeding; Hg 6.7 checked at SNF on 3/4; recheck ED Hg in ED is 8.1;  -Melena likely passing old blood (last melena 3/4); no new melena today;  -Hg 8.1 close to her baseline, will monitor Hg, Tf prn;   2. Acute blood loss anemia due to GIB;  -monitor Hg, TF prn; cont PO iron   3. A FIB not on anticoagulation due to bleeding, fall risk;  -HR not controlled; resume BB, titrate per response  4. Dementia; cont home regimen  -patient is DNR; d/w family confirmed    Possible d/c SNF in AM if Hg stable   None;  if consultant consulted, please document name and whether formally or informally consulted  Code Status: DNR (must indicate code status--if unknown or must be presumed, indicate so) Family Communication: d/w patient, her daughter  (indicate person  spoken with, if applicable, with phone number if by telephone) Disposition Plan: SNF in 24-48 hours  (indicate anticipated LOS)  Time spent: >35 minutes   Esperanza Sheets [facility] Pager [phone]  If 7PM-7AM, please contact night-coverage [email or web address] Password [facility] 04/04/2013, 5:40 PM

## 2013-04-04 NOTE — Progress Notes (Signed)
CSW met with patient and daughter at bedside.  Patient alert and oriented x3.  Patient reports that she will be returning to [facility] once medically cleared.  Patient and daughter awaiting for the intended course of treatment from the medical staff and that time will determine disposition.     Chesley Noon, MSW, LCSWA, 04/04/2013, 5:19 PM Evening Clinical Social Worker [phone]

## 2013-04-04 NOTE — ED Provider Notes (Addendum)
5:12 PM Reported melena yesterday with dropping hgb per facility.  Repeat hemoglobin here today is consistent with her prior hemoglobins.  I suspect given that the rectal exam today demonstrated black stool that this is blood that has been sitting in her rectum and not an upper GI bleed.  Abdominal exam is benign.  Patient be admitted for observation overnight for serial CBCs.  Patient appears to be in atrial fibrillation with rapid ventricular response now with a heart rate in the 119-120 range.  Patient be given 5 mg IV Lopressor  5:34 PM ECG interpretation   Date: 04/04/2013  Rate: 111  Rhythm: atrial fibrillation with rapid ventricular response  QRS Axis: normal  Intervals: normal  ST/T Wave abnormalities: normal  Conduction Disutrbances: none  Narrative Interpretation:   Old EKG Reviewed: No significant changes noted     Lyanne CoKevin M Taivon Haroon, MD 04/04/13 1714  Lyanne CoKevin M Derren Suydam, MD 04/04/13 1736

## 2013-04-04 NOTE — ED Notes (Addendum)
Per EMS[facility] Pt has a hx of recent GI bleed. Nursing home staff report pt's current hgb is 6.9. Nursing home reports pt has had black tarry stool since hospitalization. Pt has hx of dementia, pt is alert and able to follow commands. HR 90-120, BP 140/80.

## 2013-04-04 NOTE — ED Notes (Signed)
Bed: WA09 Expected date:  Expected time:  Means of arrival:  Comments: EMS- elderly, rectal bleeding

## 2013-04-04 NOTE — ED Provider Notes (Signed)
CSN: [id number]     Arrival date & time 04/04/13  1307 History   First MD Initiated Contact with Patient 04/04/13 1355     Chief Complaint  Patient presents with  . GI Bleeding  . Abnormal Lab   Level V caveat for dementia  (Consider location/radiation/quality/duration/timing/severity/associated sxs/prior Treatment) HPI Daughter states patient had a GI bleed 5 days ago and was admitted to the hospital for a low hemoglobin. She was having bloody stools prior to coming to the hospital. She had another bloody stool while in the hospital 3 days ago. Her HEENT hemoglobin stabilized at 8.2. Yesterday she was noted to have a black stool at her nursing facility. They did a hemoglobin on her yesterday was 7.2. They repeated it today at noon and it was 6.9. She has not had a bowel movement since the one yesterday. She was sent to the ED for further evaluation.  PT also started on iron pills this week.   Daughter states patient was on Coumadin for atrial fibrillation. However they stopped it her most recent hospitalization because of recurrent GI bleeds. She was admitted in January 2014 with GI bleed. She reports colonoscopy showed diverticulosis.  Patient has been eating well. Patient denies any abdominal pain. She's not had any nausea or vomiting. Patient states she feels fine.   PCP Dr. Chilton Si  Past Medical History  Diagnosis Date  . Hypertension   . Hypothyroidism   . Osteoarthritis   . Osteoporosis   . Diverticulosis   . Chronic diarrhea   . Macular degeneration   . Kyphosis     of thoracic spine  . History of GI diverticular bleed Sept 3, 2003  . Toxic goiter     hx w subsequent ablation and hypothyroid state  . Mild cognitive disorder   . Bronchiectasis     left lower lobe noted on x-ray 2/05  . Hiatal hernia     hx w repair  . Urinary incontinence   . Urinary tract bacterial infections   . Atrial fibrillation Sept 12, 2005    new onset  . Right renal atrophy 2005  . Pacemaker    . H/O Clostridium difficile infection   . Macular degeneration   . Diverticulosis    Past Surgical History  Procedure Laterality Date  . Repair of hiatal hernia and tube, gastrostomy.  08/06/1999  . Total knee arthroplasty  10/13/2003    Right  . Laparoscopic repair of ventral incisional hernia with  04/21/2000  . Colonoscopy  02/09/2012    Procedure: COLONOSCOPY;  Surgeon: Rachael Fee, MD;  Location: [facility];  Service: Endoscopy;  Laterality: N/A;   Family History  Problem Relation Age of Onset  . Aneurysm Father   . Cancer Daughter     Breast and lung cancer  . Hypertension Son   . Hyperlipidemia Son    History  Substance Use Topics  . Smoking status: Never Smoker   . Smokeless tobacco: Never Used  . Alcohol Use: No     Comment: Occasional wine.   Lives in [place]  OB History   Grav Para Term Preterm Abortions TAB SAB Ect Mult Living                 Review of Systems  All other systems reviewed and are negative.      Allergies  Asacol; Aspirin; Butalbital-asa-caff-codeine; and Codeine  Home Medications   Current Outpatient Rx  Name  Route  Sig  Dispense  Refill  .  acetaminophen (TYLENOL) 500 MG tablet   Oral   Take 500 mg by mouth every evening.          . donepezil (ARICEPT) 10 MG tablet   Oral   Take 10 mg by mouth at bedtime.          . enalapril (VASOTEC) 10 MG tablet   Oral   Take 10 mg by mouth every morning.          . escitalopram (LEXAPRO) 5 MG tablet   Oral   Take 5 mg by mouth every morning.          . ferrous sulfate 325 (65 FE) MG tablet   Oral   Take 325 mg by mouth 2 (two) times daily with a meal.         . folic acid (FOLVITE) 1 MG tablet   Oral   Take 1 mg by mouth daily.         Marland Kitchen. lactose free nutrition (BOOST) LIQD   Oral   Take 237 mLs by mouth as needed (as desired/ needed per patient).         Marland Kitchen. levothyroxine (SYNTHROID, LEVOTHROID) 100 MCG tablet   Oral   Take 100-125 mcg by mouth See admin  instructions. Take 100mcg tablet daily Monday- Friday, takes 125mcg on Sat. And Sunday.         . metoprolol (LOPRESSOR) 100 MG tablet   Oral   Take 100 mg by mouth 2 (two) times daily.         . Multiple Vitamin (MULTIVITAMIN WITH MINERALS) TABS tablet   Oral   Take 1 tablet by mouth 2 (two) times daily.         . Multiple Vitamins-Minerals (PRESERVISION/LUTEIN) CAPS   Oral   Take 1 capsule by mouth 2 (two) times daily.          . polyethylene glycol (MIRALAX / GLYCOLAX) packet   Oral   Take 17 g by mouth daily.          . Probiotic Product (ALIGN) 4 MG CAPS   Oral   Take 4 mg by mouth every morning.          . tuberculin 5 UNIT/0.1ML injection   Intradermal   Inject 0.1 mLs into the skin once. Scheduled to give again on 04/20/13         . vitamin B-12 (CYANOCOBALAMIN) 1000 MCG tablet   Oral   Take 1,000 mcg by mouth daily.           ED Triage Vitals  Enc Vitals Group     BP 04/04/13 1315 140/78 mmHg     Pulse Rate 04/04/13 1315 117     Resp 04/04/13 1315 18     Temp 04/04/13 1315 97.6 F (36.4 C)     Temp src 04/04/13 1315 Oral     SpO2 04/04/13 1315 98 %     Weight 04/04/13 1748 111 lb 5.3 oz (50.5 kg)     Height 04/04/13 1748 5\' 5"  (1.651 m)     Head Cir --      Peak Flow --      Pain Score --      Pain Loc --      Pain Edu? --      Excl. in GC? --    Vital signs normal except tachycardia    Physical Exam  Nursing note and vitals reviewed. Constitutional: She is oriented to person, place, and time.  Non-toxic appearance. She does not appear ill. No distress.  Pleasant elderly female  HENT:  Head: Normocephalic and atraumatic.  Right Ear: External ear normal.  Left Ear: External ear normal.  Nose: Nose normal. No mucosal edema or rhinorrhea.  Mouth/Throat: Oropharynx is clear and moist and mucous membranes are normal. No dental abscesses or uvula swelling.  Eyes: Conjunctivae and EOM are normal. Pupils are equal, round, and reactive to  light.  Neck: Normal range of motion and full passive range of motion without pain. Neck supple.  Cardiovascular: Normal rate, regular rhythm and normal heart sounds.  Exam reveals no gallop and no friction rub.   No murmur heard. Pulmonary/Chest: Effort normal and breath sounds normal. No respiratory distress. She has no wheezes. She has no rhonchi. She has no rales. She exhibits no tenderness and no crepitus.  Abdominal: Soft. Normal appearance and bowel sounds are normal. She exhibits no distension. There is no tenderness. There is no rebound and no guarding.  Musculoskeletal: Normal range of motion. She exhibits no edema and no tenderness.  Moves all extremities well.   Neurological: She is alert and oriented to person, place, and time. She has normal strength. No cranial nerve deficit.  Skin: Skin is warm, dry and intact. No rash noted. No erythema. No pallor.  Psychiatric: She has a normal mood and affect. Her speech is normal and behavior is normal. Her mood appears not anxious.    ED Course  Procedures (including critical care time)  16:00 pt turned over to Dr Patria Mane to get lab results from today and her disposition.      Labs Review  Labs from today pending.   Results for orders placed in visit on 04/02/13  CBC AND DIFFERENTIAL      Result Value Ref Range   Hemoglobin 10.7 (*) 12.0 - 16.0 g/dL   HCT 33 (*) 36 - 46 %   Platelets 205  150 - 399 K/L   WBC 6.8    BASIC METABOLIC PANEL      Result Value Ref Range   Glucose 95     BUN 23 (*) 4 - 21 mg/dL   Creatinine 0.9  0.5 - 1.1 mg/dL   Potassium 4.3  3.4 - 5.3 mmol/L   Sodium 139  137 - 147 mmol/L     Imaging Review No results found.   EKG Interpretation None      MDM   Final diagnoses:  Melena  Atrial fibrillation with rapid ventricular response    Disposition pending.     Devoria Albe, MD, Armando Gang     Ward Givens, MD 04/04/13 [phone]

## 2013-04-05 LAB — CBC
HCT: 27.3 % — ABNORMAL LOW (ref 36.0–46.0)
HEMOGLOBIN: 8.5 g/dL — AB (ref 12.0–15.0)
MCH: 29.7 pg (ref 26.0–34.0)
MCHC: 31.1 g/dL (ref 30.0–36.0)
MCV: 95.5 fL (ref 78.0–100.0)
Platelets: 206 10*3/uL (ref 150–400)
RBC: 2.86 MIL/uL — ABNORMAL LOW (ref 3.87–5.11)
RDW: 16.4 % — ABNORMAL HIGH (ref 11.5–15.5)
WBC: 5.1 10*3/uL (ref 4.0–10.5)

## 2013-04-05 MED ORDER — METOPROLOL TARTRATE 1 MG/ML IV SOLN
5.0000 mg | Freq: Once | INTRAVENOUS | Status: AC
  Administered 2013-04-05: 5 mg via INTRAVENOUS

## 2013-04-05 MED ORDER — SENNOSIDES-DOCUSATE SODIUM 8.6-50 MG PO TABS: 1.0000 | ORAL_TABLET | Freq: Two times a day (BID) | ORAL | Status: AC

## 2013-04-05 NOTE — Progress Notes (Signed)
Patient refusing AM labs despite being educated on their importance. Patient said that her daughter would be by to see her around 8 AM, advised lab to come back then so that daughter might be able to convince patient to let lab draw blood. [facility] on call notified. Will continue to monitor patient.

## 2013-04-05 NOTE — Progress Notes (Addendum)
Received alert from CMT that he was concerned that patient may have some AV disassociation. Obtained EKG on patient, showed a-fib with RVR, rates in the 100s-110s. Patient was sinus tachy at the beginning of shift. Patient asymptomatic, no chest pain or shortness of breath. Notified on call for [facility]. 5 mg IV lopressor ordered, will continue to monitor.  Leeroy Chainer, Minta Fair Lauren

## 2013-04-05 NOTE — Discharge Instructions (Signed)
Please ensure that bowel movements are on the soft side to prevent constipation Please also note that Senna was added to bowel regimen Please repeat hemoglobin in 2-3 days If you notice signs of bleeding please call myself on [phone] (Dr. Izola PriceMyers)  Diverticulosis Diverticulosis is a common condition that develops when small pouches (diverticula) form in the wall of the colon. The risk of diverticulosis increases with age. It happens more often in people who eat a low-fiber diet. Most individuals with diverticulosis have no symptoms. Those individuals with symptoms usually experience abdominal pain, constipation, or loose stools (diarrhea). HOME CARE INSTRUCTIONS   Increase the amount of fiber in your diet as directed by your caregiver or dietician. This may reduce symptoms of diverticulosis.  Your caregiver may recommend taking a dietary fiber supplement.  Drink at least 6 to 8 glasses of water each day to prevent constipation.  Try not to strain when you have a bowel movement.  Your caregiver may recommend avoiding nuts and seeds to prevent complications, although this is still an uncertain benefit.  Only take over-the-counter or prescription medicines for pain, discomfort, or fever as directed by your caregiver. FOODS WITH HIGH FIBER CONTENT INCLUDE:  Fruits. Apple, peach, pear, tangerine, raisins, prunes.  Vegetables. Brussels sprouts, asparagus, broccoli, cabbage, carrot, cauliflower, romaine lettuce, spinach, summer squash, tomato, winter squash, zucchini.  Starchy Vegetables. Baked beans, kidney beans, lima beans, split peas, lentils, potatoes (with skin).  Grains. Whole wheat bread, brown rice, bran flake cereal, plain oatmeal, white rice, shredded wheat, bran muffins. SEEK IMMEDIATE MEDICAL CARE IF:   You develop increasing pain or severe bloating.  You have an oral temperature above 102 F (38.9 C), not controlled by medicine.  You develop vomiting or bowel movements  that are bloody or black. Document Released: 10/15/2003 Document Revised: 04/11/2011 Document Reviewed: 06/17/2009 [facility] Patient Information 2014 [employer], [place].

## 2013-04-05 NOTE — Discharge Summary (Signed)
Physician Discharge Summary  [name]:[id number] DOB: April 09, 1917 DOA: 04/04/2013  PCP: [name], MD  Admit date: 04/04/2013 Discharge date: 04/05/2013  Recommendations for Outpatient Follow-up:  1. Pt will need to follow up with PCP within first week of discharge 2. Please obtain BMP to evaluate electrolytes and kidney function 3. Please also check CBC to evaluate Hg and Hct levels, please check in next 2-3 days 4. Please also ensure that bowel movements are regular and soft 5. Senna was added to the bowel regimen to help with constipation 6. Please call myself on [phone] ([name]) if patient has additional bloody bowel movements  Discharge Diagnoses: Lower GI bleed due to diverticulosis in the setting of constipation Principal Problem:   Melena Active Problems:   Atrial fibrillation   Rectal bleeding   Acute blood loss anemia   Dementia   GIB (gastrointestinal bleeding)  Discharge Condition: Stable  Diet recommendation: Heart healthy diet discussed in details   History of present illness:  Patient is 80 year old female with a history of atrial fibrillation, not on anticoagulation secondary to risk of bleeding, history of diverticulosis and rectal bleed secondary to diverticulosis, dementia. She was brought from [facility] after an episode of bloody bowel movement. Per family member at bedside, patient has been constipated for several days. She's currently on MiraLax but family explains it may not have helped. There was no reported abdominal or urinary concerns, no chest pain or shortness of breath. Patient has had colonoscopy in 2014 by Dr. Christella Hartigan which showed diverticular bleed.  Hospital Course:  Principal Problem:   Melena - Most likely secondary to diverticular bleeding in the setting of constipation - Hemoglobin currently stable at 8.5, no signs of active bleeding reported - No transfusion required during this hospitalization - Discussed  with family at bedside and explained need for additional stool softener - Will add senna upon discharge - Family advised to call back my number if they notice any additional bleeding Active Problems:   Atrial fibrillation - Rate controlled   Dementia - Stable and at baseline  Procedures/Studies:  None  Consultations:  None  Antibiotics:  None  Discharge Exam: Filed Vitals:   04/05/13 0458  BP: 142/89  Pulse: 86  Temp: 98.2 F (36.8 C)  Resp: 18   Filed Vitals:   04/04/13 2101 04/05/13 0055 04/05/13 0244 04/05/13 0458  BP: 155/92 159/107 140/81 142/89  Pulse: 120 95 81 86  Temp: 98 F (36.7 C)   98.2 F (36.8 C)  TempSrc:    Oral  Resp: 24   18  Height:      Weight:      SpO2: 98%   98%    General: Pt is alert, follows commands appropriately, not in acute distress Cardiovascular: Regular rate and rhythm, S1/S2 +, no murmurs, no rubs, no gallops Respiratory: Clear to auscultation bilaterally, no wheezing, no crackles, no rhonchi Abdominal: Soft, non tender, non distended, bowel sounds +, no guarding Extremities: no edema, no cyanosis, pulses palpable bilaterally DP and PT Neuro: Grossly nonfocal  Discharge Instructions  Discharge Orders   Future Appointments Provider Department Dept Phone   05/23/2013 9:55 AM [facility] [name] Office [phone]   Future Orders Complete By Expires   Diet - low sodium heart healthy  As directed    Increase activity slowly  As directed        Medication List         acetaminophen 500 MG  tablet  Commonly known as:  TYLENOL  Take 500 mg by mouth every evening.     ALIGN 4 MG Caps  Take 4 mg by mouth every morning.     ARICEPT 10 MG tablet  Generic drug:  donepezil  Take 10 mg by mouth at bedtime.     enalapril 10 MG tablet  Commonly known as:  VASOTEC  Take 10 mg by mouth every morning.     escitalopram 5 MG tablet  Commonly known as:  LEXAPRO  Take 5 mg by mouth every  morning.     ferrous sulfate 325 (65 FE) MG tablet  Take 325 mg by mouth 2 (two) times daily with a meal.     folic acid 1 MG tablet  Commonly known as:  FOLVITE  Take 1 mg by mouth daily.     lactose free nutrition Liqd  Take 237 mLs by mouth as needed (as desired/ needed per patient).     levothyroxine 100 MCG tablet  Commonly known as:  SYNTHROID, LEVOTHROID  Take 100-125 mcg by mouth See admin instructions. Take 100mcg tablet daily Monday- Friday, takes 125mcg on Sat. And Sunday.     metoprolol 100 MG tablet  Commonly known as:  LOPRESSOR  Take 100 mg by mouth 2 (two) times daily.     multivitamin with minerals Tabs tablet  Take 1 tablet by mouth 2 (two) times daily.     polyethylene glycol packet  Commonly known as:  MIRALAX / GLYCOLAX  Take 17 g by mouth daily.     PRESERVISION/LUTEIN Caps  Take 1 capsule by mouth 2 (two) times daily.     senna-docusate 8.6-50 MG per tablet  Commonly known as:  Senokot-S  Take 1 tablet by mouth 2 (two) times daily.     tuberculin 5 UNIT/0.1ML injection  Inject 0.1 mLs into the skin once. Scheduled to give again on 04/20/13     vitamin B-12 1000 MCG tablet  Commonly known as:  CYANOCOBALAMIN  Take 1,000 mcg by mouth daily.           Follow-up Information   Follow up with [name], Lenon CurtARTHUR G, MD In 3 days.   Specialty:  Internal Medicine   Contact information:   [address] [place] [address] [phone]        The results of significant diagnostics from this hospitalization (including imaging, microbiology, ancillary and laboratory) are listed below for reference.     Microbiology: Recent Results (from the past 240 hour(s))  MRSA PCR SCREENING     Status: None   Collection Time    03/31/13  1:55 AM      Result Value Ref Range Status   MRSA by PCR NEGATIVE  NEGATIVE Final   Comment:            The GeneXpert MRSA Assay (FDA     approved for NASAL specimens     only), is one component of a     comprehensive  MRSA colonization     surveillance program. It is not     intended to diagnose MRSA     infection nor to guide or     monitor treatment for     MRSA infections.     Labs: Basic Metabolic Panel:  Recent Labs Lab 03/30/13 03/30/13 2230 03/31/13 1043 04/02/13 0545 04/04/13 1523  NA 139 143 141 140 139  K 4.3 4.2 3.7 3.5* 4.0  CL  --  106 105 107 104  CO2  --   --  25 21 23   GLUCOSE  --  116* 89 100* 101*  BUN 23* 28* 21 15 20   CREATININE 0.9 1.00 0.89 0.84 0.94  CALCIUM  --   --  8.5 8.2* 8.3*   Liver Function Tests:  Recent Labs Lab 04/04/13 1523  AST 14  ALT 5  ALKPHOS 79  BILITOT 0.3  PROT 6.0  ALBUMIN 2.4*   No results found for this basename: LIPASE, AMYLASE,  in the last 168 hours No results found for this basename: AMMONIA,  in the last 168 hours CBC:  Recent Labs Lab 03/30/13 2220  04/01/13 0323 04/01/13 0930 04/02/13 0545 04/04/13 1523 04/05/13 1009  WBC 8.8  < > 6.8 6.6 6.3 5.7 5.1  NEUTROABS 5.5  --   --   --   --  3.5  --   HGB 10.8*  < > 8.1* 8.7* 8.2* 8.1* 8.5*  HCT 33.7*  < > 26.0* 27.2* 26.0* 25.1* 27.3*  MCV 94.9  < > 94.9 96.1 95.2 95.1 95.5  PLT 239  < > 173 178 182 191 206  < > = values in this interval not displayed. Cardiac Enzymes: No results found for this basename: CKTOTAL, CKMB, CKMBINDEX, TROPONINI,  in the last 168 hours BNP: BNP (last 3 results) No results found for this basename: PROBNP,  in the last 8760 hours CBG: No results found for this basename: GLUCAP,  in the last 168 hours   SIGNED: Time coordinating discharge: Over 30 minutes  Debbora Presto, MD  [facility] 04/05/2013, 1:16 PM Pager [phone]  If 7PM-7AM, please contact night-coverage [email or web address] Password [email or web address]

## 2013-04-05 NOTE — Progress Notes (Signed)
[facility]. CSW left message for Jodie EchevariaJanie McDowell @ [facility] & sent d/c summary. Discharge packet given to RN, Candise BowensJen. Family to transport back to SNF.   Lincoln MaxinKelly Marcy Sookdeo, LCSW [facility] Clinical Social Worker cell #: [phone]

## 2013-04-05 NOTE — Progress Notes (Signed)
[facility] and gave report to [place], Charity fundraiserN. Patient being transported by daughter, packet given to family. J.Yanira Tolsma, RN

## 2013-04-05 NOTE — Progress Notes (Signed)
Clinical Social Work Department BRIEF PSYCHOSOCIAL ASSESSMENT 04/05/2013  Patient:  [name],[name]     Account Number:  [id number]     Admit date:  04/04/2013  Clinical Social Worker:  Orpah GreekFOLEY,Karl Knarr, LCSWA  Date/Time:  04/05/2013 10:28 AM  Referred by:  Physician  Date Referred:  04/05/2013 Referred for  Other - See comment   Other Referral:   [facility]   Interview type:  Family Other interview type:   patient's daughter & son-in-law    PSYCHOSOCIAL DATA Living Status:  FACILITY Admitted from facility:  [facility] Level of care:  Skilled Nursing Facility Primary support name:  [name] (daughter) c#: [phone] Primary support relationship to patient:  CHILD, ADULT Degree of support available:   good    CURRENT CONCERNS Current Concerns  Post-Acute Placement   Other Concerns:    SOCIAL WORK ASSESSMENT / PLAN CSW received consult that [facility]   Assessment/plan status:  Information/Referral to [employer] Other assessment/ plan:   Information/referral to community resources:   CSW completed FL2 and [facility], confirmed with Jodie EchevariaJanie McDowell @ [facility] that they would be able to take patient back when stable.    PATIENT'S/FAMILY'S RESPONSE TO PLAN OF CARE: Patient's daughter states that they've had a good experience [facility] and would like for her to return. Patient was admitted for a GI Bleed, they are checking her labs and possibly needing blood.       Lincoln MaxinKelly Nocholas Damaso, LCSW [facility] Clinical Social Worker cell #: [phone]

## 2013-04-07 LAB — CBC AND DIFFERENTIAL
HCT: 22 % — AB (ref 36–46)
Hemoglobin: 7.3 g/dL — AB (ref 12.0–16.0)
Platelets: 217 10*3/uL (ref 150–399)
WBC: 5.3 10*3/mL

## 2013-04-09 ENCOUNTER — Non-Acute Institutional Stay (SKILLED_NURSING_FACILITY): Payer: Medicare Other | Admitting: Nurse Practitioner

## 2013-04-09 ENCOUNTER — Encounter: Payer: Self-pay | Admitting: Nurse Practitioner

## 2013-04-09 DIAGNOSIS — K5900 Constipation, unspecified: Secondary | ICD-10-CM

## 2013-04-09 DIAGNOSIS — G3184 Mild cognitive impairment, so stated: Secondary | ICD-10-CM

## 2013-04-09 DIAGNOSIS — F3289 Other specified depressive episodes: Secondary | ICD-10-CM

## 2013-04-09 DIAGNOSIS — I509 Heart failure, unspecified: Secondary | ICD-10-CM

## 2013-04-09 DIAGNOSIS — R609 Edema, unspecified: Secondary | ICD-10-CM

## 2013-04-09 DIAGNOSIS — F329 Major depressive disorder, single episode, unspecified: Secondary | ICD-10-CM

## 2013-04-09 DIAGNOSIS — F32A Depression, unspecified: Secondary | ICD-10-CM

## 2013-04-09 DIAGNOSIS — D62 Acute posthemorrhagic anemia: Secondary | ICD-10-CM

## 2013-04-09 DIAGNOSIS — K625 Hemorrhage of anus and rectum: Secondary | ICD-10-CM

## 2013-04-09 DIAGNOSIS — R112 Nausea with vomiting, unspecified: Secondary | ICD-10-CM

## 2013-04-09 DIAGNOSIS — E039 Hypothyroidism, unspecified: Secondary | ICD-10-CM

## 2013-04-09 DIAGNOSIS — I4891 Unspecified atrial fibrillation: Secondary | ICD-10-CM

## 2013-04-09 DIAGNOSIS — I10 Essential (primary) hypertension: Secondary | ICD-10-CM

## 2013-04-09 LAB — CBC AND DIFFERENTIAL
HCT: 24 % — AB (ref 36–46)
HEMOGLOBIN: 7.8 g/dL — AB (ref 12.0–16.0)
Platelets: 213 10*3/uL (ref 150–399)
WBC: 5.8 10*3/mL

## 2013-04-09 NOTE — Assessment & Plan Note (Signed)
Controlled, takes Enalapril 10mg  daily and Metoprolol 100mg  bid.

## 2013-04-09 NOTE — Assessment & Plan Note (Signed)
No apparent.  ?

## 2013-04-09 NOTE — Assessment & Plan Note (Signed)
Resolved, takes MiraLax and newly added Senna

## 2013-04-09 NOTE — Progress Notes (Signed)
Patient ID: [name], female   DOB: [birth year], 80 y.o.   MRN: [id number]   Code Status: DNR  Allergies  Allergen Reactions  . Asacol [Mesalamine] Other (See Comments)    Unknown reaction  . Aspirin Other (See Comments)    Per mar  . Butalbital-Asa-Caff-Codeine Other (See Comments)    Unknown reaction  . Codeine Other (See Comments)    Per mar    Chief Complaint  Patient presents with  . Medical Managment of Chronic Issues    diverticular bleed, nausea  . Hospitalization Follow-up    HPI: Patient is a 80 y.o. female seen in the SNF at [facility] today for evaluation of s/p hospitalization for diverticular bleed, new onset of nausea and vomiting after breakfast and other chronic medical conditions.    Hospitalized again 04/04/13-04/05/13 for diverticular bleeding in setting of constipation with Hgb 6.9 04/04/13 at [facility]. Patient has a history of atrial fibrillation and presently off Coumadin due to risk of bleeding  Patient has had colonoscopy in 2014 by Dr. Christella Hartigan which showed diverticular bleed. She was discharged from hospital with Hemoglobin currently stable at 8.5, no signs of active bleeding reported No transfusion required during this hospitalization.    Hospitalized from 03/30/2013 to 04/02/2013 for rectal bleed, last BM was 03/31/13. Had 1 unit of FFP in ED. No further coumadin.    Problem List Items Addressed This Visit   Unspecified constipation     Resolved, takes MiraLax and newly added Senna     Rectal bleed     Diverticular, no active bleed, takes Folic acid, B12, and Fe    Nausea with vomiting - Primary     Clear appearance emesis after breakfast. Will check CBC, CMP, Cath UA C/S. Dc MVI. Have Zofran 4mg  q6hr prn available to her. Adding Omeprazole 20mg  po daily.     Mild cognitive impairment     Resides at SNF, takes Aricept, progressed rapidly in the past year.       Hypothyroidism     Levothyroxine M-F, Sat + Sun-TSH 2.68 12/24/12.      HYPERTENSION, BENIGN     Controlled, takes Enalapril 10mg  daily and Metoprolol 100mg  bid.       Edema (Chronic)     No apparent    Depression     Sad facial looks, takes Lexapro 5mg       CHF (congestive heart failure)     Compensated clinically,     Atrial fibrillation     Rate controlled, off Dig and Coumadin      Acute blood loss anemia     Diverticular bleed, recent hospitalization x2, no bleed transfusion, Hgb 7.8 04/09/13. Repeat CBC in am.        Review of Systems:  Review of Systems  Constitutional: Negative for fever, chills, weight loss, malaise/fatigue and diaphoresis.  HENT: Positive for ear pain and hearing loss. Negative for congestion, ear discharge and sore throat.        Left   Eyes: Negative for pain, discharge and redness.  Respiratory: Negative for cough, sputum production and wheezing.   Cardiovascular: Positive for PND. Negative for chest pain, palpitations, orthopnea and claudication. Leg swelling: ankle/feet edeam, L>R.  Gastrointestinal: Positive for nausea and vomiting. Negative for heartburn, abdominal pain, diarrhea, constipation and blood in stool.       Pill dysphagia  Genitourinary: Positive for frequency (incontinent of bladder). Negative for dysuria, urgency, hematuria and flank pain.  Musculoskeletal: Positive for joint  pain. Negative for back pain, falls, myalgias and neck pain.  Skin: Negative for itching and rash.       Chronic BLE venous insufficiency. Purplish BLE when in dependent position with several keratotic plaques anterior ly.    Neurological: Negative for dizziness, tingling, tremors, sensory change, speech change, focal weakness, seizures, loss of consciousness and weakness.  Endo/Heme/Allergies: Negative for environmental allergies and polydipsia. Bruises/bleeds easily.  Psychiatric/Behavioral: Positive for depression and memory loss. Negative for suicidal ideas, hallucinations and substance abuse. The patient is not  nervous/anxious and does not have insomnia.      Past Medical History  Diagnosis Date  . Hypertension   . Hypothyroidism   . Osteoarthritis   . Osteoporosis   . Diverticulosis   . Chronic diarrhea   . Macular degeneration   . Kyphosis     of thoracic spine  . History of GI diverticular bleed Sept 3, 2003  . Toxic goiter     hx w subsequent ablation and hypothyroid state  . Mild cognitive disorder   . Bronchiectasis     left lower lobe noted on x-ray 2/05  . Hiatal hernia     hx w repair  . Urinary incontinence   . Urinary tract bacterial infections   . Atrial fibrillation Sept 12, 2005    new onset  . Right renal atrophy 2005  . Pacemaker   . H/O Clostridium difficile infection   . Macular degeneration   . Diverticulosis    Past Surgical History  Procedure Laterality Date  . Repair of hiatal hernia and tube, gastrostomy.  08/06/1999  . Total knee arthroplasty  10/13/2003    Right  . Laparoscopic repair of ventral incisional hernia with  04/21/2000  . Colonoscopy  02/09/2012    Procedure: COLONOSCOPY;  Surgeon: Rachael Fee, MD;  Location: [facility];  Service: Endoscopy;  Laterality: N/A;   Social History:   reports that she has never smoked. She has never used smokeless tobacco. She reports that she does not drink alcohol or use illicit drugs.  Family History  Problem Relation Age of Onset  . Aneurysm Father   . Cancer Daughter     Breast and lung cancer  . Hypertension Son   . Hyperlipidemia Son     Medications: Patient's Medications  New Prescriptions   No medications on file  Previous Medications   ACETAMINOPHEN (TYLENOL) 500 MG TABLET    Take 500 mg by mouth every evening.    DONEPEZIL (ARICEPT) 10 MG TABLET    Take 10 mg by mouth at bedtime.    ENALAPRIL (VASOTEC) 10 MG TABLET    Take 10 mg by mouth every morning.    ESCITALOPRAM (LEXAPRO) 5 MG TABLET    Take 5 mg by mouth every morning.    FERROUS SULFATE 325 (65 FE) MG TABLET    Take 325 mg by  mouth 2 (two) times daily with a meal.   FOLIC ACID (FOLVITE) 1 MG TABLET    Take 1 mg by mouth daily.   LACTOSE FREE NUTRITION (BOOST) LIQD    Take 237 mLs by mouth as needed (as desired/ needed per patient).   LEVOTHYROXINE (SYNTHROID, LEVOTHROID) 100 MCG TABLET    Take 100-125 mcg by mouth See admin instructions. Take tablet daily Monday- Friday, takes on Sat. And Sunday.   METOPROLOL (LOPRESSOR) 100 MG TABLET    Take 100 mg by mouth 2 (two) times daily.   POLYETHYLENE GLYCOL (MIRALAX / GLYCOLAX) PACKET  Take 17 g by mouth daily.    PROBIOTIC PRODUCT (ALIGN) 4 MG CAPS    Take 4 mg by mouth every morning.    SENNA-DOCUSATE (SENOKOT-S) 8.6-50 MG PER TABLET    Take 1 tablet by mouth 2 (two) times daily.   TUBERCULIN 5 UNIT/0.1ML INJECTION    Inject 0.1 mLs into the skin once. Scheduled to give again on 04/20/13   VITAMIN B-12 (CYANOCOBALAMIN) 1000 MCG TABLET    Take 1,000 mcg by mouth daily.  Modified Medications   No medications on file  Discontinued Medications   MULTIPLE VITAMIN (MULTIVITAMIN WITH MINERALS) TABS TABLET    Take 1 tablet by mouth 2 (two) times daily.   MULTIPLE VITAMINS-MINERALS (PRESERVISION/LUTEIN) CAPS    Take 1 capsule by mouth 2 (two) times daily.      Physical Exam: Physical Exam  Constitutional: She is oriented to person, place, and time. She appears well-developed and well-nourished. No distress.  HENT:  Head: Normocephalic and atraumatic.  Right Ear: Tympanic membrane and external ear normal. No lacerations. No drainage, swelling or tenderness. No foreign bodies. No mastoid tenderness. Tympanic membrane is not injected, not scarred, not perforated, not erythematous, not retracted and not bulging. Tympanic membrane mobility is normal. No middle ear effusion. No hemotympanum. Decreased hearing is noted.  Left Ear: Tympanic membrane and external ear normal. No lacerations. No drainage, swelling or tenderness. No foreign bodies. No mastoid tenderness.  Tympanic membrane is not injected, not scarred, not perforated, not erythematous, not retracted and not bulging. Tympanic membrane mobility is normal.  No middle ear effusion. No hemotympanum. No decreased hearing is noted.  Eyes: Conjunctivae and EOM are normal. Pupils are equal, round, and reactive to light.  Neck: Normal range of motion. Neck supple. No JVD present. No thyromegaly present.  Cardiovascular: Normal rate.  An irregular rhythm present.  Murmur heard.  Systolic murmur is present with a grade of 2/6  Left upper chest pacemaker.   Pulmonary/Chest: Effort normal. She has no wheezes. She has rales (bibasilar dry rales). She exhibits no tenderness.  Dry rales bibasilar.   Abdominal: Soft. Bowel sounds are normal. There is no tenderness.  Musculoskeletal: Normal range of motion. She exhibits edema (BLE. L>R left ankle. ).  Trace to 1+ edema seen in BLE  Lymphadenopathy:    She has no cervical adenopathy.  Neurological: She is alert and oriented to person, place, and time. She has normal reflexes. No cranial nerve deficit. She exhibits normal muscle tone. Coordination normal.  Skin: Skin is warm and dry. No rash noted. She is not diaphoretic. Erythema: bilateral lower leg pigmentation.  Chronic BLE venous insufficiency. Purplish BLE when in dependent position with several keratotic plaques anterior ly.   Inflamed oral cavity linings.    Psychiatric: Her speech is normal. Her affect is not angry, not blunt and not inappropriate. She is not aggressive. Thought content is not paranoid and not delusional. Cognition and memory are impaired. She expresses impulsivity. She does not express inappropriate judgment. She exhibits a depressed mood. She exhibits abnormal recent memory.    Filed Vitals:   04/09/13 1218  BP: 141/86  Pulse: 69  Temp: 96.8 F (36 C)  TempSrc: Tympanic  Resp: 22      Labs reviewed: Basic Metabolic Panel:  Recent Labs  16/10/96 2230 03/31/13 1043  04/02/13 0545 04/04/13 1523  NA 143 141 140 139  K 4.2 3.7 3.5* 4.0  CL 106 105 107 104  CO2  --  25 21 23  GLUCOSE 116* 89 100* 101*  BUN 28* 21 15 20   CREATININE 1.00 0.89 0.84 0.94  CALCIUM  --  8.5 8.2* 8.3*  TSH  --  10.023*  --   --    Liver Function Tests:  Recent Labs  01/14/13 04/04/13 1523  AST 20 14  ALT 9 5  ALKPHOS 72 79  BILITOT  --  0.3  PROT  --  6.0  ALBUMIN  --  2.4*   CBC:  Recent Labs  03/30/13 2220  04/02/13 0545 04/04/13 1523 04/05/13 1009 04/07/13 04/09/13  WBC 8.8  < > 6.3 5.7 5.1 5.3 5.8  NEUTROABS 5.5  --   --  3.5  --   --   --   HGB 10.8*  < > 8.2* 8.1* 8.5* 7.3* 7.8*  HCT 33.7*  < > 26.0* 25.1* 27.3* 22* 24*  MCV 94.9  < > 95.2 95.1 95.5  --   --   PLT 239  < > 182 191 206 217 213  < > = values in this interval not displayed. Past Procedures:  01/09/13 CXR borderline cardiomegaly unchanged without pulmonary vascular congestion or pleural effusion, old granulomatous disease unchanged, mild interstitial findings area seen at both lower lungs unchanged and likely chronic, no inflammatory consolidate or suspicious nodule.     Assessment/Plan Unspecified constipation Resolved, takes MiraLax and newly added Senna   Rectal bleed Diverticular, no active bleed, takes Folic acid, B12, and Fe  Mild cognitive impairment Resides at SNF, takes Aricept, progressed rapidly in the past year.     Hypothyroidism Levothyroxine 100mcg M-F, 125mcg Sat + Sun-TSH 2.68 12/24/12.    HYPERTENSION, BENIGN Controlled, takes Enalapril 10mg  daily and Metoprolol 100mg  bid.     Edema No apparent  Depression Sad facial looks, takes Lexapro 5mg     CHF (congestive heart failure) Compensated clinically,   Atrial fibrillation Rate controlled, off Dig and Coumadin    Acute blood loss anemia Diverticular bleed, recent hospitalization x2, no bleed transfusion, Hgb 7.8 04/09/13. Repeat CBC in am.   Nausea with vomiting Clear appearance emesis  after breakfast. Will check CBC, CMP, Cath UA C/S. Dc MVI. Have Zofran 4mg  q6hr prn available to her. Adding Omeprazole 20mg  po daily.     Family/ Staff Communication: observe the patient.   Goals of Care: SNF  Labs/tests ordered: CBC, CMP,  Cath UA C/S

## 2013-04-09 NOTE — Assessment & Plan Note (Signed)
Resides at SNF, takes Aricept, progressed rapidly in the past year.    

## 2013-04-09 NOTE — Assessment & Plan Note (Signed)
Levothyroxine 100mcg M-F, 125mcg Sat + Sun-TSH 2.68 12/24/12                         

## 2013-04-09 NOTE — Assessment & Plan Note (Signed)
Diverticular, no active bleed, takes Folic acid, B12, and Fe

## 2013-04-09 NOTE — Assessment & Plan Note (Signed)
Compensated clinically,

## 2013-04-09 NOTE — Assessment & Plan Note (Signed)
Rate controlled, off Dig and Coumadin

## 2013-04-09 NOTE — Assessment & Plan Note (Addendum)
Clear appearance emesis after breakfast. Will check CBC, CMP, Cath UA C/S. Dc MVI. Have Zofran 4mg  q6hr prn available to her. Adding Omeprazole 20mg  po daily.

## 2013-04-09 NOTE — Assessment & Plan Note (Signed)
Diverticular bleed, recent hospitalization x2, no bleed transfusion, Hgb 7.8 04/09/13. Repeat CBC in am.

## 2013-04-09 NOTE — Assessment & Plan Note (Signed)
Sad facial looks, takes Lexapro 5mg                        

## 2013-04-10 LAB — BASIC METABOLIC PANEL
BUN: 17 mg/dL (ref 4–21)
CREATININE: 1 mg/dL (ref 0.5–1.1)
GLUCOSE: 88 mg/dL
POTASSIUM: 3.3 mmol/L — AB (ref 3.4–5.3)
Sodium: 148 mmol/L — AB (ref 137–147)

## 2013-04-10 LAB — CBC AND DIFFERENTIAL
HCT: 26 % — AB (ref 36–46)
Hemoglobin: 8.2 g/dL — AB (ref 12.0–16.0)
PLATELETS: 129 10*3/uL — AB (ref 150–399)
WBC: 6.4 10^3/mL

## 2013-04-10 LAB — HEPATIC FUNCTION PANEL
ALT: 8 U/L (ref 7–35)
AST: 12 U/L — AB (ref 13–35)
Alkaline Phosphatase: 66 U/L (ref 25–125)
Bilirubin, Total: 0.6 mg/dL

## 2013-04-12 ENCOUNTER — Non-Acute Institutional Stay (SKILLED_NURSING_FACILITY): Payer: Medicare Other | Admitting: Nurse Practitioner

## 2013-04-12 ENCOUNTER — Encounter: Payer: Self-pay | Admitting: Nurse Practitioner

## 2013-04-12 DIAGNOSIS — R112 Nausea with vomiting, unspecified: Secondary | ICD-10-CM

## 2013-04-12 DIAGNOSIS — F329 Major depressive disorder, single episode, unspecified: Secondary | ICD-10-CM

## 2013-04-12 DIAGNOSIS — R609 Edema, unspecified: Secondary | ICD-10-CM

## 2013-04-12 DIAGNOSIS — K5900 Constipation, unspecified: Secondary | ICD-10-CM

## 2013-04-12 DIAGNOSIS — E876 Hypokalemia: Secondary | ICD-10-CM | POA: Insufficient documentation

## 2013-04-12 DIAGNOSIS — E039 Hypothyroidism, unspecified: Secondary | ICD-10-CM

## 2013-04-12 DIAGNOSIS — F32A Depression, unspecified: Secondary | ICD-10-CM

## 2013-04-12 DIAGNOSIS — D62 Acute posthemorrhagic anemia: Secondary | ICD-10-CM

## 2013-04-12 DIAGNOSIS — I4891 Unspecified atrial fibrillation: Secondary | ICD-10-CM

## 2013-04-12 DIAGNOSIS — F3289 Other specified depressive episodes: Secondary | ICD-10-CM

## 2013-04-12 DIAGNOSIS — E870 Hyperosmolality and hypernatremia: Secondary | ICD-10-CM

## 2013-04-12 DIAGNOSIS — F0390 Unspecified dementia without behavioral disturbance: Secondary | ICD-10-CM

## 2013-04-12 DIAGNOSIS — N390 Urinary tract infection, site not specified: Secondary | ICD-10-CM

## 2013-04-12 DIAGNOSIS — I509 Heart failure, unspecified: Secondary | ICD-10-CM

## 2013-04-12 DIAGNOSIS — I10 Essential (primary) hypertension: Secondary | ICD-10-CM

## 2013-04-12 LAB — BASIC METABOLIC PANEL
BUN: 18 mg/dL (ref 4–21)
CREATININE: 1 mg/dL (ref 0.5–1.1)
Glucose: 106 mg/dL
Potassium: 3.8 mmol/L (ref 3.4–5.3)
Sodium: 146 mmol/L (ref 137–147)

## 2013-04-12 NOTE — Assessment & Plan Note (Signed)
Rate controlled, off Dig and Coumadin

## 2013-04-12 NOTE — Assessment & Plan Note (Signed)
Mild, serum K 3.3 04/10/13, Kcl 20meq po daily started, repat BMP today.

## 2013-04-12 NOTE — Assessment & Plan Note (Signed)
N/V and generalized malaise-UA gram negative rods >100,00c/ml, pos nitrite, wbc >50, will empirically start her on Septra DS bid for total 7 days along with FloraStor bid. May change ABT when urine culture result is available.

## 2013-04-12 NOTE — Assessment & Plan Note (Signed)
Likely related decreased oral intake 2nd to N/V. 1/2 NS 75cc/hr now, BMP now and Monday

## 2013-04-12 NOTE — Assessment & Plan Note (Signed)
No rectal bleed. Hgb 8.2 04/10/13. Continue B12, Folic acid, Iron, f/u CBC

## 2013-04-12 NOTE — Assessment & Plan Note (Signed)
Resides at SNF, takes Aricept, progressed rapidly in the past year.

## 2013-04-12 NOTE — Assessment & Plan Note (Signed)
Resolved, takes MiraLax and newly added Senna

## 2013-04-12 NOTE — Assessment & Plan Note (Signed)
Compensated clinically.  

## 2013-04-12 NOTE — Assessment & Plan Note (Signed)
Resolved

## 2013-04-12 NOTE — Assessment & Plan Note (Signed)
Levothyroxine 100mcg M-F, 125mcg Sat + Sun-TSH 2.68 12/24/12                         

## 2013-04-12 NOTE — Progress Notes (Signed)
Patient ID: [name], female   DOB: 01/15/1918, 80 y.o.   MRN: [id number]   Code Status: DNR  Allergies  Allergen Reactions  . Asacol [Mesalamine] Other (See Comments)    Unknown reaction  . Aspirin Other (See Comments)    Per mar  . Butalbital-Asa-Caff-Codeine Other (See Comments)    Unknown reaction  . Codeine Other (See Comments)    Per mar    Chief Complaint  Patient presents with  . Medical Managment of Chronic Issues  . Acute Visit    hypernatremia, hypokalemia, acute blood loss anemia, UTI.  Marland Kitchen Dementia    HPI: Patient is a 80 y.o. female seen in the SNF at [facility] today for evaluation of  UTI, hypernatremia, hypokalemia, diverticular bleed,  and other chronic medical conditions.    Hospitalized again 04/04/13-04/05/13 for diverticular bleeding in setting of constipation with Hgb 6.9 04/04/13 at [facility]. Patient has a history of atrial fibrillation and presently off Coumadin due to risk of bleeding  Patient has had colonoscopy in 2014 by Dr. Christella Hartigan which showed diverticular bleed. She was discharged from hospital with Hemoglobin currently stable at 8.5, no signs of active bleeding reported No transfusion required during this hospitalization.    Hospitalized from 03/30/2013 to 04/02/2013 for rectal bleed, last BM was 03/31/13. Had 1 unit of FFP in ED. No further coumadin.    Problem List Items Addressed This Visit   Urinary tract infection, site not specified     N/V and generalized malaise-UA gram negative rods >100,00c/ml, pos nitrite, wbc >50, will empirically start her on Septra DS bid for total 7 days along with FloraStor bid. May change ABT when urine culture result is available.     Unspecified constipation     Resolved, takes MiraLax and newly added Senna      Nausea with vomiting - Primary     Resolved.     Hypothyroidism     Levothyroxine M-F, Sat + Sun-TSH 2.68 12/24/12.      Hypokalemia     Mild, serum K 3.3 04/10/13, Kcl po daily  started, repat BMP today.     HYPERTENSION, BENIGN     Controlled, takes Enalapril 10mg  daily and Metoprolol 100mg  bid.       Hypernatremia     Likely related decreased oral intake 2nd to N/V. 1/2 NS 75cc/hr now, BMP now and Monday     Edema (Chronic)     Not apparent today.     Depression     Sad facial looks, takes Lexapro 5mg        Dementia     Resides at SNF, takes Aricept, progressed rapidly in the past year.       CHF (congestive heart failure)     Compensated clinically     Atrial fibrillation with RVR     Rate controlled, off Dig and Coumadin       Acute blood loss anemia     No rectal bleed. Hgb 8.2 04/10/13. Continue B12, Folic acid, Iron, f/u CBC       Review of Systems:  Review of Systems  Constitutional: Negative for fever, chills, weight loss, malaise/fatigue and diaphoresis.  HENT: Positive for ear pain and hearing loss. Negative for congestion, ear discharge and sore throat.        Left   Eyes: Negative for pain, discharge and redness.  Respiratory: Negative for cough, sputum production and wheezing.   Cardiovascular: Positive for PND. Negative for chest  pain, palpitations, orthopnea and claudication. Leg swelling: ankle/feet edeam, L>R.  Gastrointestinal: Negative for heartburn, nausea, vomiting, abdominal pain, diarrhea, constipation and blood in stool.       Pill dysphagia  Genitourinary: Positive for frequency (incontinent of bladder). Negative for dysuria, urgency, hematuria and flank pain.  Musculoskeletal: Positive for joint pain. Negative for back pain, falls, myalgias and neck pain.  Skin: Negative for itching and rash.       Chronic BLE venous insufficiency. Purplish BLE when in dependent position with several keratotic plaques anterior ly.    Neurological: Negative for dizziness, tingling, tremors, sensory change, speech change, focal weakness, seizures, loss of consciousness and weakness.  Endo/Heme/Allergies: Negative for environmental  allergies and polydipsia. Bruises/bleeds easily.  Psychiatric/Behavioral: Positive for depression and memory loss. Negative for suicidal ideas, hallucinations and substance abuse. The patient is not nervous/anxious and does not have insomnia.      Past Medical History  Diagnosis Date  . Hypertension   . Hypothyroidism   . Osteoarthritis   . Osteoporosis   . Diverticulosis   . Chronic diarrhea   . Macular degeneration   . Kyphosis     of thoracic spine  . History of GI diverticular bleed Sept 3, 2003  . Toxic goiter     hx w subsequent ablation and hypothyroid state  . Mild cognitive disorder   . Bronchiectasis     left lower lobe noted on x-ray 2/05  . Hiatal hernia     hx w repair  . Urinary incontinence   . Urinary tract bacterial infections   . Atrial fibrillation Sept 12, 2005    new onset  . Right renal atrophy 2005  . Pacemaker   . H/O Clostridium difficile infection   . Macular degeneration   . Diverticulosis    Past Surgical History  Procedure Laterality Date  . Repair of hiatal hernia and tube, gastrostomy.  08/06/1999  . Total knee arthroplasty  10/13/2003    Right  . Laparoscopic repair of ventral incisional hernia with  04/21/2000  . Colonoscopy  02/09/2012    Procedure: COLONOSCOPY;  Surgeon: Rachael Fee, MD;  Location: [facility];  Service: Endoscopy;  Laterality: N/A;   Social History:   reports that she has never smoked. She has never used smokeless tobacco. She reports that she does not drink alcohol or use illicit drugs.  Family History  Problem Relation Age of Onset  . Aneurysm Father   . Cancer Daughter     Breast and lung cancer  . Hypertension Son   . Hyperlipidemia Son     Medications: Patient's Medications  New Prescriptions   No medications on file  Previous Medications   ACETAMINOPHEN (TYLENOL) 500 MG TABLET    Take 500 mg by mouth every evening.    DONEPEZIL (ARICEPT) 10 MG TABLET    Take 10 mg by mouth at bedtime.    ENALAPRIL  (VASOTEC) 10 MG TABLET    Take 10 mg by mouth every morning.    ESCITALOPRAM (LEXAPRO) 5 MG TABLET    Take 5 mg by mouth every morning.    FERROUS SULFATE 325 (65 FE) MG TABLET    Take 325 mg by mouth 2 (two) times daily with a meal.   FOLIC ACID (FOLVITE) 1 MG TABLET    Take 1 mg by mouth daily.   LACTOSE FREE NUTRITION (BOOST) LIQD    Take 237 mLs by mouth as needed (as desired/ needed per patient).   LEVOTHYROXINE (SYNTHROID,  LEVOTHROID) 100 MCG TABLET    Take 100-125 mcg by mouth See admin instructions. Take 100mcg tablet daily Monday- Friday, takes 125mcg on Sat. And Sunday.   METOPROLOL (LOPRESSOR) 100 MG TABLET    Take 100 mg by mouth 2 (two) times daily.   POLYETHYLENE GLYCOL (MIRALAX / GLYCOLAX) PACKET    Take 17 g by mouth daily.    PROBIOTIC PRODUCT (ALIGN) 4 MG CAPS    Take 4 mg by mouth every morning.    SENNA-DOCUSATE (SENOKOT-S) 8.6-50 MG PER TABLET    Take 1 tablet by mouth 2 (two) times daily.   TUBERCULIN 5 UNIT/0.1ML INJECTION    Inject 0.1 mLs into the skin once. Scheduled to give again on 04/20/13   VITAMIN B-12 (CYANOCOBALAMIN) 1000 MCG TABLET    Take 1,000 mcg by mouth daily.  Modified Medications   No medications on file  Discontinued Medications   No medications on file     Physical Exam: Physical Exam  Constitutional: She is oriented to person, place, and time. She appears well-developed and well-nourished. No distress.  HENT:  Head: Normocephalic and atraumatic.  Right Ear: Tympanic membrane and external ear normal. No lacerations. No drainage, swelling or tenderness. No foreign bodies. No mastoid tenderness. Tympanic membrane is not injected, not scarred, not perforated, not erythematous, not retracted and not bulging. Tympanic membrane mobility is normal. No middle ear effusion. No hemotympanum. Decreased hearing is noted.  Left Ear: Tympanic membrane and external ear normal. No lacerations. No drainage, swelling or tenderness. No foreign bodies. No mastoid  tenderness. Tympanic membrane is not injected, not scarred, not perforated, not erythematous, not retracted and not bulging. Tympanic membrane mobility is normal.  No middle ear effusion. No hemotympanum. No decreased hearing is noted.  Eyes: Conjunctivae and EOM are normal. Pupils are equal, round, and reactive to light.  Neck: Normal range of motion. Neck supple. No JVD present. No thyromegaly present.  Cardiovascular: Normal rate.  An irregular rhythm present.  Murmur heard.  Systolic murmur is present with a grade of 2/6  Left upper chest pacemaker.   Pulmonary/Chest: Effort normal. She has no wheezes. She has rales (bibasilar dry rales). She exhibits no tenderness.  Dry rales bibasilar.   Abdominal: Soft. Bowel sounds are normal. There is no tenderness.  Musculoskeletal: Normal range of motion. She exhibits no edema (BLE. L>R left ankle. ).  edema in BLE-no apparent today.   Lymphadenopathy:    She has no cervical adenopathy.  Neurological: She is alert and oriented to person, place, and time. She has normal reflexes. No cranial nerve deficit. She exhibits normal muscle tone. Coordination normal.  Skin: Skin is warm and dry. No rash noted. She is not diaphoretic. Erythema: bilateral lower leg pigmentation.  Chronic BLE venous insufficiency. Purplish BLE when in dependent position with several keratotic plaques anterior ly.   Inflamed oral cavity linings.    Psychiatric: Her speech is normal. Her affect is not angry, not blunt and not inappropriate. She is not aggressive. Thought content is not paranoid and not delusional. Cognition and memory are impaired. She expresses impulsivity. She does not express inappropriate judgment. She exhibits a depressed mood. She exhibits abnormal recent memory.    Filed Vitals:   04/12/13 1113  BP: 150/84  Pulse: 74  Temp: 97.8 F (36.6 C)  TempSrc: Tympanic  Resp: 18      Labs reviewed: Basic Metabolic Panel:  Recent Labs  88/41/6602/28/15 2230  03/31/13 1043 04/02/13 0545 04/04/13 1523 04/10/13  NA  143 141 140 139 148*  K 4.2 3.7 3.5* 4.0 3.3*  CL 106 105 107 104  --   CO2  --  25 21 23   --   GLUCOSE 116* 89 100* 101*  --   BUN 28* 21 15 20 17   CREATININE 1.00 0.89 0.84 0.94 1.0  CALCIUM  --  8.5 8.2* 8.3*  --   TSH  --  10.023*  --   --   --    Liver Function Tests:  Recent Labs  01/14/13 04/04/13 1523 04/10/13  AST 20 14 12*  ALT 9 5 8   ALKPHOS 72 79 66  BILITOT  --  0.3  --   PROT  --  6.0  --   ALBUMIN  --  2.4*  --    CBC:  Recent Labs  03/30/13 2220  04/02/13 0545 04/04/13 1523 04/05/13 1009 04/07/13 04/09/13 04/10/13  WBC 8.8  < > 6.3 5.7 5.1 5.3 5.8 6.4  NEUTROABS 5.5  --   --  3.5  --   --   --   --   HGB 10.8*  < > 8.2* 8.1* 8.5* 7.3* 7.8* 8.2*  HCT 33.7*  < > 26.0* 25.1* 27.3* 22* 24* 26*  MCV 94.9  < > 95.2 95.1 95.5  --   --   --   PLT 239  < > 182 191 206 217 213 129*  < > = values in this interval not displayed. Past Procedures:  01/09/13 CXR borderline cardiomegaly unchanged without pulmonary vascular congestion or pleural effusion, old granulomatous disease unchanged, mild interstitial findings area seen at both lower lungs unchanged and likely chronic, no inflammatory consolidate or suspicious nodule.     Assessment/Plan Nausea with vomiting Resolved.   Edema Not apparent today.   Acute blood loss anemia No rectal bleed. Hgb 8.2 04/10/13. Continue B12, Folic acid, Iron, f/u CBC  Atrial fibrillation with RVR Rate controlled, off Dig and Coumadin     CHF (congestive heart failure) Compensated clinically   Dementia Resides at SNF, takes Aricept, progressed rapidly in the past year.     Depression Sad facial looks, takes Lexapro 5mg      HYPERTENSION, BENIGN Controlled, takes Enalapril 10mg  daily and Metoprolol 100mg  bid.     Hypothyroidism Levothyroxine M-F, Sat + Sun-TSH 2.68 12/24/12.    Unspecified constipation Resolved, takes MiraLax and  newly added Senna    Hypokalemia Mild, serum K 3.3 04/10/13, Kcl po daily started, repat BMP today.   Hypernatremia Likely related decreased oral intake 2nd to N/V. 1/2 NS 75cc/hr now, BMP now and Monday   Urinary tract infection, site not specified N/V and generalized malaise-UA gram negative rods >100,00c/ml, pos nitrite, wbc >50, will empirically start her on Septra DS bid for total 7 days along with FloraStor bid. May change ABT when urine culture result is available.     Family/ Staff Communication: observe the patient.   Goals of Care: SNF  Labs/tests ordered: BMP stat and next Monday.

## 2013-04-12 NOTE — Assessment & Plan Note (Signed)
Not apparent today.   

## 2013-04-12 NOTE — Assessment & Plan Note (Signed)
Sad facial looks, takes Lexapro 5mg 

## 2013-04-12 NOTE — Assessment & Plan Note (Signed)
Controlled, takes Enalapril 10mg  daily and Metoprolol 100mg  bid.

## 2013-04-16 ENCOUNTER — Non-Acute Institutional Stay (SKILLED_NURSING_FACILITY): Payer: Medicare Other | Admitting: Nurse Practitioner

## 2013-04-16 ENCOUNTER — Encounter: Payer: Self-pay | Admitting: Nurse Practitioner

## 2013-04-16 DIAGNOSIS — K5900 Constipation, unspecified: Secondary | ICD-10-CM

## 2013-04-16 DIAGNOSIS — R634 Abnormal weight loss: Secondary | ICD-10-CM

## 2013-04-16 DIAGNOSIS — Z8719 Personal history of other diseases of the digestive system: Secondary | ICD-10-CM

## 2013-04-16 DIAGNOSIS — E876 Hypokalemia: Secondary | ICD-10-CM

## 2013-04-16 DIAGNOSIS — I509 Heart failure, unspecified: Secondary | ICD-10-CM

## 2013-04-16 DIAGNOSIS — F329 Major depressive disorder, single episode, unspecified: Secondary | ICD-10-CM

## 2013-04-16 DIAGNOSIS — I10 Essential (primary) hypertension: Secondary | ICD-10-CM

## 2013-04-16 DIAGNOSIS — D62 Acute posthemorrhagic anemia: Secondary | ICD-10-CM

## 2013-04-16 DIAGNOSIS — Z7901 Long term (current) use of anticoagulants: Secondary | ICD-10-CM

## 2013-04-16 DIAGNOSIS — N390 Urinary tract infection, site not specified: Secondary | ICD-10-CM

## 2013-04-16 DIAGNOSIS — E039 Hypothyroidism, unspecified: Secondary | ICD-10-CM

## 2013-04-16 DIAGNOSIS — R112 Nausea with vomiting, unspecified: Secondary | ICD-10-CM

## 2013-04-16 DIAGNOSIS — F3289 Other specified depressive episodes: Secondary | ICD-10-CM

## 2013-04-16 DIAGNOSIS — E870 Hyperosmolality and hypernatremia: Secondary | ICD-10-CM

## 2013-04-16 DIAGNOSIS — F32A Depression, unspecified: Secondary | ICD-10-CM

## 2013-04-16 DIAGNOSIS — G3184 Mild cognitive impairment, so stated: Secondary | ICD-10-CM

## 2013-04-16 DIAGNOSIS — R609 Edema, unspecified: Secondary | ICD-10-CM

## 2013-04-16 DIAGNOSIS — I4891 Unspecified atrial fibrillation: Secondary | ICD-10-CM

## 2013-04-16 NOTE — Progress Notes (Signed)
Patient ID: [name], female   DOB: [birth year], 80 y.o.   MRN: [id number]   Code Status: DNR. Comfort measures: no IVF, Labs, VS q shift.   Allergies  Allergen Reactions  . Asacol [Mesalamine] Other (See Comments)    Unknown reaction  . Aspirin Other (See Comments)    Per mar  . Butalbital-Asa-Caff-Codeine Other (See Comments)    Unknown reaction  . Codeine Other (See Comments)    Per mar    Chief Complaint  Patient presents with  . Medical Managment of Chronic Issues  . Acute Visit    UTI, nausea, comfort measures.   . Dementia    HPI: Patient is a 80 y.o. female seen in the SNF at [facility] today for evaluation of  UTI, hypernatremia, hypokalemia, diverticular bleed, anemia, nausea,  and other chronic medical conditions.    Hospitalized again 04/04/13-04/05/13 for diverticular bleeding in setting of constipation with Hgb 6.9 04/04/13 at [facility]. Patient has a history of atrial fibrillation and presently off Coumadin due to risk of bleeding  Patient has had colonoscopy in 2014 by Dr. Christella Hartigan which showed diverticular bleed. She was discharged from hospital with Hemoglobin currently stable at 8.5, no signs of active bleeding reported No transfusion required during this hospitalization.    Hospitalized from 03/30/2013 to 04/02/2013 for rectal bleed, last BM was 03/31/13. Had 1 unit of FFP in ED. No further coumadin.    Problem List Items Addressed This Visit   Edema (Chronic)     Not apparent today.      HYPERTENSION, BENIGN     Controlled, takes Enalapril 10mg  daily and Metoprolol 100mg  bid.      Atrial fibrillation     Rate controlled, off Dig and Coumadin. Continue Metoprolol 100mg  bid.      History of GI diverticular bleed     No active bleed.     Acute blood loss anemia     No rectal bleed. Hgb 8.2 04/10/13. dc B12, Folic acid, Iron, no active bleed, no further Labs.      Hypothyroidism     Levothyroxine M-F, Sat + Sun-TSH 2.68 12/24/12.       Mild cognitive impairment     Resides at SNF, dc Aricept, progressed rapidly in the past year.      Chronic anticoagulation     Off Coumadin due to GI bleed x2    Depression     Sad facial looks, dc Lexapro 5mg       CHF (congestive heart failure)     Compensated clinically, able to sleep supine at night.     Loss of weight     # 15Ibs in the past 2 months.     Unspecified constipation     Resolved, dc MiraLax(has been held) and continue Senna-last BM this am.       Nausea with vomiting     In am for the past 2 days, will add Zofran 4mg  qam. Observe the patient.     Hypokalemia     Resolved, K 3.8 04/12/13. Dc Kcl.     Hypernatremia     Better, Na 146 04/12/13, 148 04/10/13    Urinary tract infection, site not specified - Primary     Urine culture 04/15/13, E. Coli, enterococcus, P. Mirabilis >100,000c/m., susceptible to PNC-will stop Septra DS(only S E Coli and P. Mirabilis)-Ampicillin 500mg  po q6hr x 7 days.        Review of Systems:  Review of Systems  Constitutional: Negative for fever, chills, weight loss, malaise/fatigue and diaphoresis.  HENT: Positive for ear pain and hearing loss. Negative for congestion, ear discharge and sore throat.        Left   Eyes: Negative for pain, discharge and redness.  Respiratory: Negative for cough, sputum production and wheezing.   Cardiovascular: Positive for PND. Negative for chest pain, palpitations, orthopnea and claudication. Leg swelling: ankle/feet edeam, L>R.  Gastrointestinal: Negative for heartburn, nausea, vomiting, abdominal pain, diarrhea, constipation and blood in stool.       Pill dysphagia  Genitourinary: Positive for frequency (incontinent of bladder). Negative for dysuria, urgency, hematuria and flank pain.  Musculoskeletal: Positive for joint pain. Negative for back pain, falls, myalgias and neck pain.  Skin: Negative for itching and rash.       Chronic BLE venous insufficiency. Purplish BLE when in dependent  position with several keratotic plaques anterior ly.    Neurological: Negative for dizziness, tingling, tremors, sensory change, speech change, focal weakness, seizures, loss of consciousness and weakness.  Endo/Heme/Allergies: Negative for environmental allergies and polydipsia. Bruises/bleeds easily.  Psychiatric/Behavioral: Positive for depression and memory loss. Negative for suicidal ideas, hallucinations and substance abuse. The patient is not nervous/anxious and does not have insomnia.      Past Medical History  Diagnosis Date  . Hypertension   . Hypothyroidism   . Osteoarthritis   . Osteoporosis   . Diverticulosis   . Chronic diarrhea   . Macular degeneration   . Kyphosis     of thoracic spine  . History of GI diverticular bleed Sept 3, 2003  . Toxic goiter     hx w subsequent ablation and hypothyroid state  . Mild cognitive disorder   . Bronchiectasis     left lower lobe noted on x-ray 2/05  . Hiatal hernia     hx w repair  . Urinary incontinence   . Urinary tract bacterial infections   . Atrial fibrillation Sept 12, 2005    new onset  . Right renal atrophy 2005  . Pacemaker   . H/O Clostridium difficile infection   . Macular degeneration   . Diverticulosis    Past Surgical History  Procedure Laterality Date  . Repair of hiatal hernia and tube, gastrostomy.  08/06/1999  . Total knee arthroplasty  10/13/2003    Right  . Laparoscopic repair of ventral incisional hernia with  04/21/2000  . Colonoscopy  02/09/2012    Procedure: COLONOSCOPY;  Surgeon: Rachael Fee, MD;  Location: [facility];  Service: Endoscopy;  Laterality: N/A;   Social History:   reports that she has never smoked. She has never used smokeless tobacco. She reports that she does not drink alcohol or use illicit drugs.  Family History  Problem Relation Age of Onset  . Aneurysm Father   . Cancer Daughter     Breast and lung cancer  . Hypertension Son   . Hyperlipidemia Son      Medications: Patient's Medications  New Prescriptions   No medications on file  Previous Medications   ACETAMINOPHEN (TYLENOL) 500 MG TABLET    Take 500 mg by mouth every evening.    ENALAPRIL (VASOTEC) 10 MG TABLET    Take 10 mg by mouth every morning.    LACTOSE FREE NUTRITION (BOOST) LIQD    Take 237 mLs by mouth as needed (as desired/ needed per patient).   LEVOTHYROXINE (SYNTHROID, LEVOTHROID) 100 MCG TABLET    Take 100-125 mcg by mouth See admin instructions.  Take tablet daily Monday- Friday, takes on Sat. And Sunday.   METOPROLOL (LOPRESSOR) 100 MG TABLET    Take 100 mg by mouth 2 (two) times daily.   SENNA-DOCUSATE (SENOKOT-S) 8.6-50 MG PER TABLET    Take 1 tablet by mouth 2 (two) times daily.   TUBERCULIN 5 UNIT/0.1ML INJECTION    Inject 0.1 mLs into the skin once. Scheduled to give again on 04/20/13  Modified Medications   No medications on file  Discontinued Medications   DONEPEZIL (ARICEPT) 10 MG TABLET    Take 10 mg by mouth at bedtime.    ESCITALOPRAM (LEXAPRO) 5 MG TABLET    Take 5 mg by mouth every morning.    FERROUS SULFATE 325 (65 FE) MG TABLET    Take 325 mg by mouth 2 (two) times daily with a meal.   FOLIC ACID (FOLVITE) 1 MG TABLET    Take 1 mg by mouth daily.   POLYETHYLENE GLYCOL (MIRALAX / GLYCOLAX) PACKET    Take 17 g by mouth daily.    PROBIOTIC PRODUCT (ALIGN) 4 MG CAPS    Take 4 mg by mouth every morning.    VITAMIN B-12 (CYANOCOBALAMIN) 1000 MCG TABLET    Take 1,000 mcg by mouth daily.     Physical Exam: Physical Exam  Constitutional: She is oriented to person, place, and time. She appears well-developed and well-nourished. No distress.  HENT:  Head: Normocephalic and atraumatic.  Right Ear: Tympanic membrane and external ear normal. No lacerations. No drainage, swelling or tenderness. No foreign bodies. No mastoid tenderness. Tympanic membrane is not injected, not scarred, not perforated, not erythematous, not retracted and not bulging.  Tympanic membrane mobility is normal. No middle ear effusion. No hemotympanum. Decreased hearing is noted.  Left Ear: Tympanic membrane and external ear normal. No lacerations. No drainage, swelling or tenderness. No foreign bodies. No mastoid tenderness. Tympanic membrane is not injected, not scarred, not perforated, not erythematous, not retracted and not bulging. Tympanic membrane mobility is normal.  No middle ear effusion. No hemotympanum. No decreased hearing is noted.  Eyes: Conjunctivae and EOM are normal. Pupils are equal, round, and reactive to light.  Neck: Normal range of motion. Neck supple. No JVD present. No thyromegaly present.  Cardiovascular: Normal rate.  An irregular rhythm present.  Murmur heard.  Systolic murmur is present with a grade of 2/6  Left upper chest pacemaker.   Pulmonary/Chest: Effort normal. She has no wheezes. She has rales (bibasilar dry rales). She exhibits no tenderness.  Dry rales bibasilar.   Abdominal: Soft. Bowel sounds are normal. There is no tenderness.  Musculoskeletal: Normal range of motion. She exhibits no edema (BLE. L>R left ankle. ).  edema in BLE-no apparent today.   Lymphadenopathy:    She has no cervical adenopathy.  Neurological: She is alert and oriented to person, place, and time. She has normal reflexes. No cranial nerve deficit. She exhibits normal muscle tone. Coordination normal.  Skin: Skin is warm and dry. No rash noted. She is not diaphoretic. Erythema: bilateral lower leg pigmentation.  Chronic BLE venous insufficiency. Purplish BLE when in dependent position with several keratotic plaques anterior ly.   Inflamed oral cavity linings.    Psychiatric: Her speech is normal. Her affect is not angry, not blunt and not inappropriate. She is not aggressive. Thought content is not paranoid and not delusional. Cognition and memory are impaired. She expresses impulsivity. She does not express inappropriate judgment. She exhibits a depressed  mood. She  exhibits abnormal recent memory.    Filed Vitals:   04/16/13 1150  BP: 130/70  Pulse: 72  Temp: 97.4 F (36.3 C)  TempSrc: Tympanic  Resp: 18      Labs reviewed: Basic Metabolic Panel:  Recent Labs  16/10/96 2230 03/31/13 1043 04/02/13 0545 04/04/13 1523 04/10/13 04/12/13  NA 143 141 140 139 148* 146  K 4.2 3.7 3.5* 4.0 3.3* 3.8  CL 106 105 107 104  --   --   CO2  --  25 21 23   --   --   GLUCOSE 116* 89 100* 101*  --   --   BUN 28* 21 15 20 17 18   CREATININE 1.00 0.89 0.45 0.94 1.0 1.0  CALCIUM  --  8.5 8.2* 8.3*  --   --   TSH  --  10.023*  --   --   --   --    Liver Function Tests:  Recent Labs  01/14/13 04/04/13 1523 04/10/13  AST 20 14 12*  ALT 9 5 8   ALKPHOS 72 79 66  BILITOT  --  0.3  --   PROT  --  6.0  --   ALBUMIN  --  2.4*  --    CBC:  Recent Labs  03/30/13 2220  04/02/13 0545 04/04/13 1523 04/05/13 1009 04/07/13 04/09/13 04/10/13  WBC 8.8  < > 6.3 5.7 5.1 5.3 5.8 6.4  NEUTROABS 5.5  --   --  3.5  --   --   --   --   HGB 10.8*  < > 8.2* 8.1* 8.5* 7.3* 7.8* 8.2*  HCT 33.7*  < > 26.0* 25.1* 27.3* 22* 24* 26*  MCV 94.9  < > 95.2 95.1 95.5  --   --   --   PLT 239  < > 182 191 206 217 213 129*  < > = values in this interval not displayed. Past Procedures:  01/09/13 CXR borderline cardiomegaly unchanged without pulmonary vascular congestion or pleural effusion, old granulomatous disease unchanged, mild interstitial findings area seen at both lower lungs unchanged and likely chronic, no inflammatory consolidate or suspicious nodule.     Assessment/Plan Edema Not apparent today.    HYPERTENSION, BENIGN Controlled, takes Enalapril 10mg  daily and Metoprolol 100mg  bid.    Atrial fibrillation Rate controlled, off Dig and Coumadin. Continue Metoprolol 100mg  bid.    History of GI diverticular bleed No active bleed.   Acute blood loss anemia No rectal bleed. Hgb 8.2 04/10/13. dc B12, Folic acid, Iron, no active bleed, no further  Labs.    Hypothyroidism Levothyroxine M-F, Sat + Sun-TSH 2.68 12/24/12.     Mild cognitive impairment Resides at SNF, dc Aricept, progressed rapidly in the past year.    Chronic anticoagulation Off Coumadin due to GI bleed x2  Depression Sad facial looks, dc Lexapro 5mg     CHF (congestive heart failure) Compensated clinically, able to sleep supine at night.   Unspecified constipation Resolved, dc MiraLax(has been held) and continue Senna-last BM this am.     Nausea with vomiting In am for the past 2 days, will add Zofran 4mg  qam. Observe the patient.   Hypokalemia Resolved, K 3.8 04/12/13. Dc Kcl.   Hypernatremia Better, Na 146 04/12/13, 148 04/10/13  Urinary tract infection, site not specified Urine culture 04/15/13, E. Coli, enterococcus, P. Mirabilis >100,000c/m., susceptible to PNC-will stop Septra DS(only S E Coli and P. Mirabilis)-Ampicillin 500mg  po q6hr x 7 days.   Loss of weight #  15Ibs in the past 2 months.     Family/ Staff Communication: observe the patient.   Goals of Care: SNF comfort measures.   Labs/tests ordered: no further Labs.

## 2013-04-16 NOTE — Assessment & Plan Note (Addendum)
Resolved, dc MiraLax(has been held) and continue Senna-last BM this am.

## 2013-04-16 NOTE — Assessment & Plan Note (Signed)
Urine culture 04/15/13, E. Coli, enterococcus, P. Mirabilis >100,000c/m., susceptible to PNC-will stop Septra DS(only S E Coli and P. Mirabilis)-Ampicillin 500mg  po q6hr x 7 days.

## 2013-04-16 NOTE — Assessment & Plan Note (Addendum)
No rectal bleed. Hgb 8.2 04/10/13. dc B12, Folic acid, Iron, no active bleed, no further Labs.

## 2013-04-16 NOTE — Assessment & Plan Note (Signed)
Not apparent today.   

## 2013-04-16 NOTE — Assessment & Plan Note (Signed)
No active bleed.

## 2013-04-16 NOTE — Assessment & Plan Note (Signed)
Levothyroxine 100mcg M-F, 125mcg Sat + Sun-TSH 2.68 12/24/12                         

## 2013-04-16 NOTE — Assessment & Plan Note (Addendum)
Resolved, K 3.8 04/12/13. Dc Kcl.

## 2013-04-16 NOTE — Assessment & Plan Note (Signed)
Controlled, takes Enalapril 10mg daily and Metoprolol 100mg bid.    

## 2013-04-16 NOTE — Assessment & Plan Note (Signed)
Rate controlled, off Dig and Coumadin. Continue Metoprolol 100mg  bid.

## 2013-04-16 NOTE — Assessment & Plan Note (Addendum)
Resides at SNF, dc Aricept, progressed rapidly in the past year.

## 2013-04-16 NOTE — Assessment & Plan Note (Addendum)
Sad facial looks, dc Lexapro 5mg 

## 2013-04-16 NOTE — Assessment & Plan Note (Signed)
#   15Ibs in the past 2 months.

## 2013-04-16 NOTE — Assessment & Plan Note (Signed)
Compensated clinically, able to sleep supine at night.   

## 2013-04-16 NOTE — Assessment & Plan Note (Signed)
In am for the past 2 days, will add Zofran 4mg  qam. Observe the patient.

## 2013-04-16 NOTE — Assessment & Plan Note (Signed)
Better, Na 146 04/12/13, 148 04/10/13

## 2013-04-16 NOTE — Assessment & Plan Note (Signed)
Off Coumadin due to GI bleed x2

## 2013-04-23 ENCOUNTER — Encounter: Payer: Self-pay | Admitting: Nurse Practitioner

## 2013-04-23 ENCOUNTER — Non-Acute Institutional Stay (SKILLED_NURSING_FACILITY): Payer: Medicare Other | Admitting: Nurse Practitioner

## 2013-04-23 DIAGNOSIS — K121 Other forms of stomatitis: Secondary | ICD-10-CM

## 2013-04-23 DIAGNOSIS — I4891 Unspecified atrial fibrillation: Secondary | ICD-10-CM

## 2013-04-23 DIAGNOSIS — I10 Essential (primary) hypertension: Secondary | ICD-10-CM

## 2013-04-23 DIAGNOSIS — F329 Major depressive disorder, single episode, unspecified: Secondary | ICD-10-CM

## 2013-04-23 DIAGNOSIS — I509 Heart failure, unspecified: Secondary | ICD-10-CM

## 2013-04-23 DIAGNOSIS — K5900 Constipation, unspecified: Secondary | ICD-10-CM

## 2013-04-23 DIAGNOSIS — Z8719 Personal history of other diseases of the digestive system: Secondary | ICD-10-CM

## 2013-04-23 DIAGNOSIS — K1230 Oral mucositis (ulcerative), unspecified: Secondary | ICD-10-CM

## 2013-04-23 DIAGNOSIS — R609 Edema, unspecified: Secondary | ICD-10-CM

## 2013-04-23 DIAGNOSIS — R112 Nausea with vomiting, unspecified: Secondary | ICD-10-CM

## 2013-04-23 DIAGNOSIS — R627 Adult failure to thrive: Secondary | ICD-10-CM

## 2013-04-23 DIAGNOSIS — E039 Hypothyroidism, unspecified: Secondary | ICD-10-CM

## 2013-04-23 DIAGNOSIS — F3289 Other specified depressive episodes: Secondary | ICD-10-CM

## 2013-04-23 DIAGNOSIS — F0390 Unspecified dementia without behavioral disturbance: Secondary | ICD-10-CM

## 2013-04-23 DIAGNOSIS — R55 Syncope and collapse: Secondary | ICD-10-CM

## 2013-04-23 DIAGNOSIS — F32A Depression, unspecified: Secondary | ICD-10-CM

## 2013-04-23 NOTE — Progress Notes (Signed)
Patient ID: [name], female   DOB: July 22, 1917, 80 y.o.   MRN: [id number]   Code Status: DNR. MOST. Comfort measures: no IVF, Labs, VS q shift.   Allergies  Allergen Reactions  . Asacol [Mesalamine] Other (See Comments)    Unknown reaction  . Aspirin Other (See Comments)    Per mar  . Butalbital-Asa-Caff-Codeine Other (See Comments)    Unknown reaction  . Codeine Other (See Comments)    Per mar    Chief Complaint  Patient presents with  . Medical Managment of Chronic Issues  . Acute Visit    sore tongue    HPI: Patient is a 80 y.o. female seen in the SNF at [facility] today for evaluation of  UTI, hypernatremia, hypokalemia, diverticular bleed, anemia, nausea,  and other chronic medical conditions.    Hospitalized again 04/04/13-04/05/13 for diverticular bleeding in setting of constipation with Hgb 6.9 04/04/13 at [facility]. Patient has a history of atrial fibrillation and presently off Coumadin due to risk of bleeding  Patient has had colonoscopy in 2014 by Dr. Christella Hartigan which showed diverticular bleed. She was discharged from hospital with Hemoglobin currently stable at 8.5, no signs of active bleeding reported No transfusion required during this hospitalization.    Hospitalized from 03/30/2013 to 04/02/2013 for rectal bleed, last BM was 03/31/13. Had 1 unit of FFP in ED. No further coumadin.    Problem List Items Addressed This Visit   Edema (Chronic)     Not apparent today.      HYPERTENSION, BENIGN     Controlled, takes Enalapril 10mg  daily and Metoprolol 100mg  bid.     Atrial fibrillation     Rate controlled, off Dig and Coumadin. Continue Metoprolol 100mg  bid.      SYNCOPE     Last episode 04/19/13 with low BP 84/40-resolved after lying down. Bp was143/71     History of GI diverticular bleed     No further active bleeding.     Hypothyroidism     Levothyroxine M-F, Sat + Sun-TSH 2.68 12/24/12.       Depression     Sad facial looks, no change since  off Lexapro 5mg        CHF (congestive heart failure)     Compensated clinically, able to sleep supine at night.      Unspecified constipation     Stable, continue Senna     Dementia     Resides at SNF, off  Aricept, progressed rapidly in the past year.       Nausea with vomiting     Better with added Zofran 4mg  qam. Observe the patient.      Stomatitis - Primary     C/o sore and sensitive tongue/mouth-inflamed tongue and oral lining. Will try Magic Mouth Wash 5cc s/s ac and hs for one week.     FTT (failure to thrive) in adult     Continue to decline: ADL, cognition, mood, weight since the last GI bleed episode. Desires comfort measures and no Hospice.        Review of Systems:  Review of Systems  Constitutional: Positive for weight loss. Negative for fever, chills, malaise/fatigue and diaphoresis.       Generalized.   HENT: Positive for hearing loss. Negative for congestion, ear discharge, ear pain and sore throat.        Sensitive and sore mouth/tongue.   Eyes: Negative for pain, discharge and redness.  Respiratory: Negative for cough, sputum  production and wheezing.   Cardiovascular: Positive for PND. Negative for chest pain, palpitations, orthopnea and claudication. Leg swelling: ankle/feet edeam, L>R.  Gastrointestinal: Negative for heartburn, nausea, vomiting, abdominal pain, diarrhea, constipation and blood in stool.       Pill dysphagia. Nausea is better controlled with am Zofran.  Genitourinary: Positive for frequency (incontinent of bladder). Negative for dysuria, urgency, hematuria and flank pain.  Musculoskeletal: Positive for joint pain. Negative for back pain, falls, myalgias and neck pain.  Skin: Negative for itching and rash.       Chronic BLE venous insufficiency. Purplish BLE when in dependent position with several keratotic plaques anterior ly.    Neurological: Positive for weakness. Negative for dizziness, tingling, tremors, sensory change, speech  change, focal weakness, seizures and loss of consciousness.  Endo/Heme/Allergies: Negative for environmental allergies and polydipsia. Bruises/bleeds easily.  Psychiatric/Behavioral: Positive for depression and memory loss. Negative for suicidal ideas, hallucinations and substance abuse. The patient is not nervous/anxious and does not have insomnia.      Past Medical History  Diagnosis Date  . Hypertension   . Hypothyroidism   . Osteoarthritis   . Osteoporosis   . Diverticulosis   . Chronic diarrhea   . Macular degeneration   . Kyphosis     of thoracic spine  . History of GI diverticular bleed Sept 3, 2003  . Toxic goiter     hx w subsequent ablation and hypothyroid state  . Mild cognitive disorder   . Bronchiectasis     left lower lobe noted on x-ray 2/05  . Hiatal hernia     hx w repair  . Urinary incontinence   . Urinary tract bacterial infections   . Atrial fibrillation Sept 12, 2005    new onset  . Right renal atrophy 2005  . Pacemaker   . H/O Clostridium difficile infection   . Macular degeneration   . Diverticulosis    Past Surgical History  Procedure Laterality Date  . Repair of hiatal hernia and tube, gastrostomy.  08/06/1999  . Total knee arthroplasty  10/13/2003    Right  . Laparoscopic repair of ventral incisional hernia with  04/21/2000  . Colonoscopy  02/09/2012    Procedure: COLONOSCOPY;  Surgeon: Rachael Feeaniel P Jacobs, MD;  Location: [facility];  Service: Endoscopy;  Laterality: N/A;   Social History:   reports that she has never smoked. She has never used smokeless tobacco. She reports that she does not drink alcohol or use illicit drugs.  Family History  Problem Relation Age of Onset  . Aneurysm Father   . Cancer Daughter     Breast and lung cancer  . Hypertension Son   . Hyperlipidemia Son     Medications: Patient's Medications  New Prescriptions   No medications on file  Previous Medications   ACETAMINOPHEN (TYLENOL) 500 MG TABLET    Take 500 mg  by mouth every evening.    ENALAPRIL (VASOTEC) 10 MG TABLET    Take 10 mg by mouth every morning.    LACTOSE FREE NUTRITION (BOOST) LIQD    Take 237 mLs by mouth as needed (as desired/ needed per patient).   LEVOTHYROXINE (SYNTHROID, LEVOTHROID) 100 MCG TABLET    Take 100-125 mcg by mouth See admin instructions. Take 100mcg tablet daily Monday- Friday, takes 125mcg on Sat. And Sunday.   METOPROLOL (LOPRESSOR) 100 MG TABLET    Take 100 mg by mouth 2 (two) times daily.   SENNA-DOCUSATE (SENOKOT-S) 8.6-50 MG PER TABLET  Take 1 tablet by mouth 2 (two) times daily.   TUBERCULIN 5 UNIT/0.1ML INJECTION    Inject 0.1 mLs into the skin once. Scheduled to give again on 04/20/13  Modified Medications   No medications on file  Discontinued Medications   No medications on file     Physical Exam: Physical Exam  Constitutional: She is oriented to person, place, and time. She appears well-developed and well-nourished. No distress.  HENT:  Head: Normocephalic and atraumatic.  Right Ear: Tympanic membrane and external ear normal. No lacerations. No drainage, swelling or tenderness. No foreign bodies. No mastoid tenderness. Tympanic membrane is not injected, not scarred, not perforated, not erythematous, not retracted and not bulging. Tympanic membrane mobility is normal. No middle ear effusion. No hemotympanum. Decreased hearing is noted.  Left Ear: Tympanic membrane and external ear normal. No lacerations. No drainage, swelling or tenderness. No foreign bodies. No mastoid tenderness. Tympanic membrane is not injected, not scarred, not perforated, not erythematous, not retracted and not bulging. Tympanic membrane mobility is normal.  No middle ear effusion. No hemotympanum. No decreased hearing is noted.  Eyes: Conjunctivae and EOM are normal. Pupils are equal, round, and reactive to light.  Neck: Normal range of motion. Neck supple. No JVD present. No thyromegaly present.  Cardiovascular: Normal rate.  An  irregular rhythm present.  Murmur heard.  Systolic murmur is present with a grade of 2/6  Left upper chest pacemaker.   Pulmonary/Chest: Effort normal. She has no wheezes. She has rales (bibasilar dry rales). She exhibits no tenderness.  Dry rales bibasilar.   Abdominal: Soft. Bowel sounds are normal. There is no tenderness.  Musculoskeletal: Normal range of motion. She exhibits no edema (BLE. L>R left ankle. ).  edema in BLE-no apparent today.   Lymphadenopathy:    She has no cervical adenopathy.  Neurological: She is alert and oriented to person, place, and time. She has normal reflexes. No cranial nerve deficit. She exhibits normal muscle tone. Coordination normal.  Skin: Skin is warm and dry. No rash noted. She is not diaphoretic. Erythema: bilateral lower leg pigmentation.  Chronic BLE venous insufficiency. Purplish BLE when in dependent position with several keratotic plaques anterior ly.   Inflamed oral cavity linings.    Psychiatric: Her speech is normal. Her affect is not angry, not blunt and not inappropriate. She is not aggressive. Thought content is not paranoid and not delusional. Cognition and memory are impaired. She expresses impulsivity. She does not express inappropriate judgment. She exhibits a depressed mood. She exhibits abnormal recent memory.    Filed Vitals:   04/23/13 1524  BP: 140/80  Pulse: 68  Temp: 96.5 F (35.8 C)  TempSrc: Tympanic  Resp: 20      Labs reviewed: Basic Metabolic Panel:  Recent Labs  16/10/96 2230 03/31/13 1043 04/02/13 0545 04/04/13 1523 04/10/13 04/12/13  NA 143 141 140 139 148* 146  K 4.2 3.7 3.5* 4.0 3.3* 3.8  CL 106 105 107 104  --   --   CO2  --  25 21 23   --   --   GLUCOSE 116* 89 100* 101*  --   --   BUN 28* 21 15 20 17 18   CREATININE 1.00 0.89 0.84 0.94 1.0 1.0  CALCIUM  --  8.5 8.2* 8.3*  --   --   TSH  --  10.023*  --   --   --   --    Liver Function Tests:  Recent Labs  01/14/13 04/04/13  1523 04/10/13  AST  20 14 12*  ALT 9 5 8   ALKPHOS 72 79 66  BILITOT  --  0.3  --   PROT  --  6.0  --   ALBUMIN  --  2.4*  --    CBC:  Recent Labs  03/30/13 2220  04/02/13 0545 04/04/13 1523 04/05/13 1009 04/07/13 04/09/13 04/10/13  WBC 8.8  < > 6.3 5.7 5.1 5.3 5.8 6.4  NEUTROABS 5.5  --   --  3.5  --   --   --   --   HGB 10.8*  < > 8.2* 8.1* 8.5* 7.3* 7.8* 8.2*  HCT 33.7*  < > 26.0* 25.1* 27.3* 22* 24* 26*  MCV 94.9  < > 95.2 95.1 95.5  --   --   --   PLT 239  < > 182 191 206 217 213 129*  < > = values in this interval not displayed. Past Procedures:  01/09/13 CXR borderline cardiomegaly unchanged without pulmonary vascular congestion or pleural effusion, old granulomatous disease unchanged, mild interstitial findings area seen at both lower lungs unchanged and likely chronic, no inflammatory consolidate or suspicious nodule.     Assessment/Plan Stomatitis C/o sore and sensitive tongue/mouth-inflamed tongue and oral lining. Will try Magic Mouth Wash 5cc s/s ac and hs for one week.   SYNCOPE Last episode 04/19/13 with low BP 84/40-resolved after lying down. Bp was143/71   Edema Not apparent today.    HYPERTENSION, BENIGN Controlled, takes Enalapril 10mg  daily and Metoprolol 100mg  bid.   Atrial fibrillation Rate controlled, off Dig and Coumadin. Continue Metoprolol 100mg  bid.    History of GI diverticular bleed No further active bleeding.   Hypothyroidism Levothyroxine M-F, Sat + Sun-TSH 2.68 12/24/12.     Depression Sad facial looks, no change since off Lexapro 5mg      CHF (congestive heart failure) Compensated clinically, able to sleep supine at night.    Unspecified constipation Stable, continue Senna   Dementia Resides at SNF, off  Aricept, progressed rapidly in the past year.     Nausea with vomiting Better with added Zofran 4mg  qam. Observe the patient.    FTT (failure to thrive) in adult Continue to decline: ADL, cognition, mood, weight since  the last GI bleed episode. Desires comfort measures and no Hospice.     Family/ Staff Communication: observe the patient.   Goals of Care: SNF comfort measures.   Labs/tests ordered: no further Labs.

## 2013-04-24 DIAGNOSIS — K121 Other forms of stomatitis: Secondary | ICD-10-CM | POA: Insufficient documentation

## 2013-04-24 DIAGNOSIS — R627 Adult failure to thrive: Secondary | ICD-10-CM | POA: Insufficient documentation

## 2013-04-24 NOTE — Assessment & Plan Note (Signed)
Continue to decline: ADL, cognition, mood, weight since the last GI bleed episode. Desires comfort measures and no Hospice.

## 2013-04-24 NOTE — Assessment & Plan Note (Signed)
Better with added Zofran 4mg  qam. Observe the patient.

## 2013-04-24 NOTE — Assessment & Plan Note (Signed)
Sad facial looks, no change since off Lexapro 5mg 

## 2013-04-24 NOTE — Assessment & Plan Note (Signed)
Last episode 04/19/13 with low BP 84/40-resolved after lying down. Bp was143/71

## 2013-04-24 NOTE — Assessment & Plan Note (Signed)
Resides at SNF, off  Aricept, progressed rapidly in the past year.

## 2013-04-24 NOTE — Assessment & Plan Note (Signed)
Rate controlled, off Dig and Coumadin. Continue Metoprolol 100mg bid.   

## 2013-04-24 NOTE — Assessment & Plan Note (Signed)
Not apparent today.   

## 2013-04-24 NOTE — Assessment & Plan Note (Signed)
Controlled, takes Enalapril 10mg daily and Metoprolol 100mg bid.    

## 2013-04-24 NOTE — Assessment & Plan Note (Signed)
No further active bleeding.

## 2013-04-24 NOTE — Assessment & Plan Note (Signed)
Levothyroxine 100mcg M-F, 125mcg Sat + Sun-TSH 2.68 12/24/12.

## 2013-04-24 NOTE — Assessment & Plan Note (Signed)
C/o sore and sensitive tongue/mouth-inflamed tongue and oral lining. Will try Magic Mouth Wash 5cc s/s ac and hs for one week.

## 2013-04-24 NOTE — Assessment & Plan Note (Signed)
Compensated clinically, able to sleep supine at night.

## 2013-04-24 NOTE — Assessment & Plan Note (Signed)
Stable, continue Senna.  ?

## 2013-05-23 ENCOUNTER — Ambulatory Visit: Payer: Medicare Other | Admitting: *Deleted

## 2013-05-24 ENCOUNTER — Encounter: Payer: Self-pay | Admitting: Internal Medicine

## 2013-05-24 LAB — MDC_IDC_ENUM_SESS_TYPE_REMOTE
Brady Statistic RV Percent Paced: 16 %
Date Time Interrogation Session: 20150424070725
Lead Channel Impedance Value: 610 Ohm
Lead Channel Pacing Threshold Amplitude: 1 V
Lead Channel Pacing Threshold Pulse Width: 0.4 ms
Lead Channel Sensing Intrinsic Amplitude: 7.2 mV
MDC IDC MSMT BATTERY REMAINING LONGEVITY: 122 mo
MDC IDC MSMT BATTERY VOLTAGE: 2.98 V
MDC IDC PG SERIAL: 7124598
MDC IDC SET LEADCHNL RV PACING AMPLITUDE: 2.5 V
MDC IDC SET LEADCHNL RV PACING PULSEWIDTH: 0.4 ms
MDC IDC SET LEADCHNL RV SENSING SENSITIVITY: 2 mV

## 2013-05-31 DEATH — deceased

## 2013-06-05 ENCOUNTER — Encounter: Payer: Self-pay | Admitting: Cardiology
